# Patient Record
Sex: Male | Born: 1940 | Race: White | Hispanic: No | Marital: Married | Smoking: Former smoker
Health system: Southern US, Community
[De-identification: ages and names within clinical notes are randomized; demographics above are authoritative.]

## PROBLEM LIST (undated history)

## (undated) DIAGNOSIS — Z9889 Other specified postprocedural states: Secondary | ICD-10-CM

## (undated) DIAGNOSIS — N400 Enlarged prostate without lower urinary tract symptoms: Secondary | ICD-10-CM

## (undated) DIAGNOSIS — Z8042 Family history of malignant neoplasm of prostate: Secondary | ICD-10-CM

## (undated) DIAGNOSIS — Z803 Family history of malignant neoplasm of breast: Secondary | ICD-10-CM

## (undated) DIAGNOSIS — M1990 Unspecified osteoarthritis, unspecified site: Secondary | ICD-10-CM

## (undated) DIAGNOSIS — K269 Duodenal ulcer, unspecified as acute or chronic, without hemorrhage or perforation: Secondary | ICD-10-CM

## (undated) DIAGNOSIS — Z8719 Personal history of other diseases of the digestive system: Secondary | ICD-10-CM

## (undated) DIAGNOSIS — R112 Nausea with vomiting, unspecified: Secondary | ICD-10-CM

## (undated) DIAGNOSIS — Z87442 Personal history of urinary calculi: Secondary | ICD-10-CM

## (undated) DIAGNOSIS — Z87448 Personal history of other diseases of urinary system: Secondary | ICD-10-CM

## (undated) DIAGNOSIS — C4490 Unspecified malignant neoplasm of skin, unspecified: Secondary | ICD-10-CM

## (undated) DIAGNOSIS — Z8669 Personal history of other diseases of the nervous system and sense organs: Secondary | ICD-10-CM

## (undated) DIAGNOSIS — Z85820 Personal history of malignant melanoma of skin: Secondary | ICD-10-CM

## (undated) DIAGNOSIS — D649 Anemia, unspecified: Secondary | ICD-10-CM

## (undated) DIAGNOSIS — E611 Iron deficiency: Secondary | ICD-10-CM

## (undated) DIAGNOSIS — K4090 Unilateral inguinal hernia, without obstruction or gangrene, not specified as recurrent: Secondary | ICD-10-CM

## (undated) DIAGNOSIS — C61 Malignant neoplasm of prostate: Secondary | ICD-10-CM

## (undated) DIAGNOSIS — B0229 Other postherpetic nervous system involvement: Secondary | ICD-10-CM

## (undated) DIAGNOSIS — I714 Abdominal aortic aneurysm, without rupture, unspecified: Secondary | ICD-10-CM

## (undated) DIAGNOSIS — R3129 Other microscopic hematuria: Secondary | ICD-10-CM

## (undated) DIAGNOSIS — R296 Repeated falls: Secondary | ICD-10-CM

## (undated) HISTORY — PX: INGUINAL HERNIA REPAIR: SUR1180

## (undated) HISTORY — DX: Family history of malignant neoplasm of prostate: Z80.42

## (undated) HISTORY — PX: JOINT REPLACEMENT: SHX530

## (undated) HISTORY — PX: MELANOMA EXCISION: SHX5266

## (undated) HISTORY — DX: Iron deficiency: E61.1

## (undated) HISTORY — PX: TONSILLECTOMY AND ADENOIDECTOMY: SUR1326

## (undated) HISTORY — DX: Other postherpetic nervous system involvement: B02.29

## (undated) HISTORY — DX: Repeated falls: R29.6

## (undated) HISTORY — DX: Personal history of malignant melanoma of skin: Z85.820

## (undated) HISTORY — PX: CATARACT EXTRACTION W/ INTRAOCULAR LENS  IMPLANT, BILATERAL: SHX1307

## (undated) HISTORY — DX: Family history of malignant neoplasm of breast: Z80.3

## (undated) HISTORY — PX: HERNIA REPAIR: SHX51

## (undated) HISTORY — PX: COLON SURGERY: SHX602

---
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
## 1962-07-18 DIAGNOSIS — K269 Duodenal ulcer, unspecified as acute or chronic, without hemorrhage or perforation: Secondary | ICD-10-CM

## 1962-07-18 HISTORY — DX: Duodenal ulcer, unspecified as acute or chronic, without hemorrhage or perforation: K26.9

## 1999-06-30 ENCOUNTER — Ambulatory Visit (HOSPITAL_BASED_OUTPATIENT_CLINIC_OR_DEPARTMENT_OTHER): Admission: RE | Admit: 1999-06-30 | Discharge: 1999-06-30 | Payer: Self-pay | Admitting: Surgery

## 2001-07-18 DIAGNOSIS — Z8669 Personal history of other diseases of the nervous system and sense organs: Secondary | ICD-10-CM

## 2001-07-18 HISTORY — DX: Personal history of other diseases of the nervous system and sense organs: Z86.69

## 2001-09-16 ENCOUNTER — Inpatient Hospital Stay (HOSPITAL_COMMUNITY): Admission: EM | Admit: 2001-09-16 | Discharge: 2001-09-24 | Payer: Self-pay

## 2001-09-24 ENCOUNTER — Inpatient Hospital Stay (HOSPITAL_COMMUNITY)
Admission: RE | Admit: 2001-09-24 | Discharge: 2001-11-16 | Payer: Self-pay | Admitting: Physical Medicine & Rehabilitation

## 2001-11-23 ENCOUNTER — Encounter
Admission: RE | Admit: 2001-11-23 | Discharge: 2002-02-18 | Payer: Self-pay | Admitting: Physical Medicine & Rehabilitation

## 2002-08-09 ENCOUNTER — Encounter: Payer: Self-pay | Admitting: Emergency Medicine

## 2002-08-09 ENCOUNTER — Emergency Department (HOSPITAL_COMMUNITY): Admission: EM | Admit: 2002-08-09 | Discharge: 2002-08-09 | Payer: Self-pay | Admitting: Emergency Medicine

## 2003-02-19 ENCOUNTER — Ambulatory Visit (HOSPITAL_COMMUNITY): Admission: RE | Admit: 2003-02-19 | Discharge: 2003-02-19 | Payer: Self-pay | Admitting: Gastroenterology

## 2004-09-08 ENCOUNTER — Ambulatory Visit (HOSPITAL_COMMUNITY): Admission: RE | Admit: 2004-09-08 | Discharge: 2004-09-08 | Payer: Self-pay | Admitting: Surgery

## 2006-11-03 ENCOUNTER — Encounter: Admission: RE | Admit: 2006-11-03 | Discharge: 2006-11-03 | Payer: Self-pay | Admitting: Sports Medicine

## 2007-03-27 ENCOUNTER — Ambulatory Visit (HOSPITAL_COMMUNITY): Admission: RE | Admit: 2007-03-27 | Discharge: 2007-03-27 | Payer: Self-pay | Admitting: General Surgery

## 2007-04-04 ENCOUNTER — Encounter (INDEPENDENT_AMBULATORY_CARE_PROVIDER_SITE_OTHER): Payer: Self-pay | Admitting: General Surgery

## 2007-04-04 ENCOUNTER — Ambulatory Visit (HOSPITAL_BASED_OUTPATIENT_CLINIC_OR_DEPARTMENT_OTHER): Admission: RE | Admit: 2007-04-04 | Discharge: 2007-04-04 | Payer: Self-pay | Admitting: General Surgery

## 2007-04-20 ENCOUNTER — Ambulatory Visit: Payer: Self-pay | Admitting: Internal Medicine

## 2007-04-30 ENCOUNTER — Ambulatory Visit (HOSPITAL_COMMUNITY): Admission: RE | Admit: 2007-04-30 | Discharge: 2007-04-30 | Payer: Self-pay | Admitting: Internal Medicine

## 2007-04-30 LAB — CBC WITH DIFFERENTIAL/PLATELET
BASO%: 1 % (ref 0.0–2.0)
Basophils Absolute: 0 10*3/uL (ref 0.0–0.1)
EOS%: 2.1 % (ref 0.0–7.0)
Eosinophils Absolute: 0.1 10*3/uL (ref 0.0–0.5)
HCT: 47.9 % (ref 38.7–49.9)
HGB: 17 g/dL (ref 13.0–17.1)
LYMPH%: 43.2 % (ref 14.0–48.0)
MCH: 31 pg (ref 28.0–33.4)
MCHC: 35.5 g/dL (ref 32.0–35.9)
MCV: 87.3 fL (ref 81.6–98.0)
MONO#: 0.4 10*3/uL (ref 0.1–0.9)
MONO%: 9.9 % (ref 0.0–13.0)
NEUT#: 2 10*3/uL (ref 1.5–6.5)
NEUT%: 43.8 % (ref 40.0–75.0)
Platelets: 179 10*3/uL (ref 145–400)
RBC: 5.49 10*6/uL (ref 4.20–5.71)
RDW: 13.4 % (ref 11.2–14.6)
WBC: 4.5 10*3/uL (ref 4.0–10.0)
lymph#: 1.9 10*3/uL (ref 0.9–3.3)

## 2007-04-30 LAB — COMPREHENSIVE METABOLIC PANEL
ALT: 10 U/L (ref 0–53)
AST: 13 U/L (ref 0–37)
Albumin: 4.4 g/dL (ref 3.5–5.2)
Alkaline Phosphatase: 68 U/L (ref 39–117)
BUN: 18 mg/dL (ref 6–23)
CO2: 29 mEq/L (ref 19–32)
Calcium: 9.2 mg/dL (ref 8.4–10.5)
Chloride: 104 mEq/L (ref 96–112)
Creatinine, Ser: 0.96 mg/dL (ref 0.40–1.50)
Glucose, Bld: 97 mg/dL (ref 70–99)
Potassium: 4.7 mEq/L (ref 3.5–5.3)
Sodium: 142 mEq/L (ref 135–145)
Total Bilirubin: 0.9 mg/dL (ref 0.3–1.2)
Total Protein: 6.8 g/dL (ref 6.0–8.3)

## 2007-04-30 LAB — LACTATE DEHYDROGENASE: LDH: 147 U/L (ref 94–250)

## 2007-06-07 ENCOUNTER — Encounter: Admission: RE | Admit: 2007-06-07 | Discharge: 2007-07-18 | Payer: Self-pay | Admitting: Family Medicine

## 2007-07-20 ENCOUNTER — Encounter: Admission: RE | Admit: 2007-07-20 | Discharge: 2007-07-27 | Payer: Self-pay | Admitting: Family Medicine

## 2007-10-18 ENCOUNTER — Ambulatory Visit: Payer: Self-pay | Admitting: Internal Medicine

## 2007-10-22 LAB — CBC WITH DIFFERENTIAL/PLATELET
BASO%: 0.4 % (ref 0.0–2.0)
Basophils Absolute: 0 10*3/uL (ref 0.0–0.1)
EOS%: 1.3 % (ref 0.0–7.0)
Eosinophils Absolute: 0.1 10*3/uL (ref 0.0–0.5)
HCT: 46.4 % (ref 38.7–49.9)
HGB: 16.5 g/dL (ref 13.0–17.1)
LYMPH%: 29.8 % (ref 14.0–48.0)
MCH: 31.2 pg (ref 28.0–33.4)
MCHC: 35.5 g/dL (ref 32.0–35.9)
MCV: 87.8 fL (ref 81.6–98.0)
MONO#: 0.5 10*3/uL (ref 0.1–0.9)
MONO%: 7.8 % (ref 0.0–13.0)
NEUT#: 3.7 10*3/uL (ref 1.5–6.5)
NEUT%: 60.7 % (ref 40.0–75.0)
Platelets: 179 10*3/uL (ref 145–400)
RBC: 5.28 10*6/uL (ref 4.20–5.71)
RDW: 13.9 % (ref 11.2–14.6)
WBC: 6.1 10*3/uL (ref 4.0–10.0)
lymph#: 1.8 10*3/uL (ref 0.9–3.3)

## 2007-10-22 LAB — COMPREHENSIVE METABOLIC PANEL
ALT: 10 U/L (ref 0–53)
AST: 15 U/L (ref 0–37)
Albumin: 4.4 g/dL (ref 3.5–5.2)
Alkaline Phosphatase: 59 U/L (ref 39–117)
BUN: 19 mg/dL (ref 6–23)
CO2: 26 mEq/L (ref 19–32)
Calcium: 9.2 mg/dL (ref 8.4–10.5)
Chloride: 104 mEq/L (ref 96–112)
Creatinine, Ser: 0.84 mg/dL (ref 0.40–1.50)
Glucose, Bld: 99 mg/dL (ref 70–99)
Potassium: 4 mEq/L (ref 3.5–5.3)
Sodium: 140 mEq/L (ref 135–145)
Total Bilirubin: 0.7 mg/dL (ref 0.3–1.2)
Total Protein: 6.8 g/dL (ref 6.0–8.3)

## 2007-10-22 LAB — LACTATE DEHYDROGENASE: LDH: 150 U/L (ref 94–250)

## 2008-04-03 ENCOUNTER — Ambulatory Visit: Payer: Self-pay | Admitting: Internal Medicine

## 2008-04-07 LAB — COMPREHENSIVE METABOLIC PANEL
ALT: 11 U/L (ref 0–53)
AST: 14 U/L (ref 0–37)
Albumin: 4 g/dL (ref 3.5–5.2)
Alkaline Phosphatase: 70 U/L (ref 39–117)
BUN: 20 mg/dL (ref 6–23)
CO2: 29 mEq/L (ref 19–32)
Calcium: 8.8 mg/dL (ref 8.4–10.5)
Chloride: 103 mEq/L (ref 96–112)
Creatinine, Ser: 1.21 mg/dL (ref 0.40–1.50)
Glucose, Bld: 132 mg/dL — ABNORMAL HIGH (ref 70–99)
Potassium: 3.7 mEq/L (ref 3.5–5.3)
Sodium: 139 mEq/L (ref 135–145)
Total Bilirubin: 0.6 mg/dL (ref 0.3–1.2)
Total Protein: 6.5 g/dL (ref 6.0–8.3)

## 2008-04-07 LAB — CBC WITH DIFFERENTIAL/PLATELET
BASO%: 0.4 % (ref 0.0–2.0)
Basophils Absolute: 0 10*3/uL (ref 0.0–0.1)
EOS%: 0.7 % (ref 0.0–7.0)
Eosinophils Absolute: 0 10*3/uL (ref 0.0–0.5)
HCT: 46.2 % (ref 38.7–49.9)
HGB: 16.2 g/dL (ref 13.0–17.1)
LYMPH%: 23.7 % (ref 14.0–48.0)
MCH: 31.2 pg (ref 28.0–33.4)
MCHC: 35 g/dL (ref 32.0–35.9)
MCV: 89 fL (ref 81.6–98.0)
MONO#: 0.3 10*3/uL (ref 0.1–0.9)
MONO%: 5.6 % (ref 0.0–13.0)
NEUT#: 3.9 10*3/uL (ref 1.5–6.5)
NEUT%: 69.6 % (ref 40.0–75.0)
Platelets: 167 10*3/uL (ref 145–400)
RBC: 5.2 10*6/uL (ref 4.20–5.71)
RDW: 13.8 % (ref 11.2–14.6)
WBC: 5.6 10*3/uL (ref 4.0–10.0)
lymph#: 1.3 10*3/uL (ref 0.9–3.3)

## 2008-04-14 ENCOUNTER — Ambulatory Visit (HOSPITAL_COMMUNITY): Admission: RE | Admit: 2008-04-14 | Discharge: 2008-04-14 | Payer: Self-pay | Admitting: Internal Medicine

## 2008-09-30 ENCOUNTER — Ambulatory Visit: Payer: Self-pay | Admitting: Internal Medicine

## 2008-10-02 LAB — COMPREHENSIVE METABOLIC PANEL
ALT: 12 U/L (ref 0–53)
AST: 17 U/L (ref 0–37)
Albumin: 4.4 g/dL (ref 3.5–5.2)
Alkaline Phosphatase: 55 U/L (ref 39–117)
BUN: 23 mg/dL (ref 6–23)
CO2: 29 mEq/L (ref 19–32)
Calcium: 8.9 mg/dL (ref 8.4–10.5)
Chloride: 102 mEq/L (ref 96–112)
Creatinine, Ser: 1.2 mg/dL (ref 0.40–1.50)
Glucose, Bld: 109 mg/dL — ABNORMAL HIGH (ref 70–99)
Potassium: 4.4 mEq/L (ref 3.5–5.3)
Sodium: 139 mEq/L (ref 135–145)
Total Bilirubin: 0.8 mg/dL (ref 0.3–1.2)
Total Protein: 6.7 g/dL (ref 6.0–8.3)

## 2008-10-02 LAB — CBC WITH DIFFERENTIAL/PLATELET
BASO%: 0.9 % (ref 0.0–2.0)
Basophils Absolute: 0.1 10*3/uL (ref 0.0–0.1)
EOS%: 2.8 % (ref 0.0–7.0)
Eosinophils Absolute: 0.2 10*3/uL (ref 0.0–0.5)
HCT: 48 % (ref 38.4–49.9)
HGB: 16.4 g/dL (ref 13.0–17.1)
LYMPH%: 33.9 % (ref 14.0–49.0)
MCH: 30.8 pg (ref 27.2–33.4)
MCHC: 34.2 g/dL (ref 32.0–36.0)
MCV: 90.1 fL (ref 79.3–98.0)
MONO#: 0.5 10*3/uL (ref 0.1–0.9)
MONO%: 8.3 % (ref 0.0–14.0)
NEUT#: 3.1 10*3/uL (ref 1.5–6.5)
NEUT%: 54.1 % (ref 39.0–75.0)
Platelets: 164 10*3/uL (ref 140–400)
RBC: 5.33 10*6/uL (ref 4.20–5.82)
RDW: 13.6 % (ref 11.0–14.6)
WBC: 5.7 10*3/uL (ref 4.0–10.3)
lymph#: 1.9 10*3/uL (ref 0.9–3.3)

## 2008-10-02 LAB — LACTATE DEHYDROGENASE: LDH: 152 U/L (ref 94–250)

## 2009-01-06 ENCOUNTER — Inpatient Hospital Stay (HOSPITAL_COMMUNITY): Admission: RE | Admit: 2009-01-06 | Discharge: 2009-01-08 | Payer: Self-pay | Admitting: Orthopedic Surgery

## 2009-01-06 HISTORY — PX: TOTAL HIP ARTHROPLASTY: SHX124

## 2009-04-08 ENCOUNTER — Ambulatory Visit: Payer: Self-pay | Admitting: Internal Medicine

## 2009-04-13 ENCOUNTER — Ambulatory Visit (HOSPITAL_COMMUNITY): Admission: RE | Admit: 2009-04-13 | Discharge: 2009-04-13 | Payer: Self-pay | Admitting: Internal Medicine

## 2009-04-13 LAB — COMPREHENSIVE METABOLIC PANEL
ALT: 8 U/L (ref 0–53)
AST: 15 U/L (ref 0–37)
Albumin: 4.5 g/dL (ref 3.5–5.2)
Alkaline Phosphatase: 79 U/L (ref 39–117)
BUN: 17 mg/dL (ref 6–23)
CO2: 30 mEq/L (ref 19–32)
Calcium: 9.2 mg/dL (ref 8.4–10.5)
Chloride: 103 mEq/L (ref 96–112)
Creatinine, Ser: 1.01 mg/dL (ref 0.40–1.50)
Glucose, Bld: 102 mg/dL — ABNORMAL HIGH (ref 70–99)
Potassium: 4.1 mEq/L (ref 3.5–5.3)
Sodium: 142 mEq/L (ref 135–145)
Total Bilirubin: 0.7 mg/dL (ref 0.3–1.2)
Total Protein: 6.8 g/dL (ref 6.0–8.3)

## 2009-04-13 LAB — CBC WITH DIFFERENTIAL/PLATELET
BASO%: 0.4 % (ref 0.0–2.0)
Basophils Absolute: 0 10*3/uL (ref 0.0–0.1)
EOS%: 0.6 % (ref 0.0–7.0)
Eosinophils Absolute: 0 10*3/uL (ref 0.0–0.5)
HCT: 48.5 % (ref 38.4–49.9)
HGB: 16.5 g/dL (ref 13.0–17.1)
LYMPH%: 27 % (ref 14.0–49.0)
MCH: 30.5 pg (ref 27.2–33.4)
MCHC: 34 g/dL (ref 32.0–36.0)
MCV: 89.4 fL (ref 79.3–98.0)
MONO#: 0.5 10*3/uL (ref 0.1–0.9)
MONO%: 7.4 % (ref 0.0–14.0)
NEUT#: 4 10*3/uL (ref 1.5–6.5)
NEUT%: 64.6 % (ref 39.0–75.0)
Platelets: 177 10*3/uL (ref 140–400)
RBC: 5.42 10*6/uL (ref 4.20–5.82)
RDW: 13.6 % (ref 11.0–14.6)
WBC: 6.2 10*3/uL (ref 4.0–10.3)
lymph#: 1.7 10*3/uL (ref 0.9–3.3)

## 2009-04-13 LAB — LACTATE DEHYDROGENASE: LDH: 147 U/L (ref 94–250)

## 2010-01-21 ENCOUNTER — Ambulatory Visit (HOSPITAL_COMMUNITY): Admission: RE | Admit: 2010-01-21 | Discharge: 2010-01-21 | Payer: Self-pay | Admitting: Gastroenterology

## 2010-03-31 ENCOUNTER — Ambulatory Visit: Payer: Self-pay | Admitting: Internal Medicine

## 2010-04-05 ENCOUNTER — Ambulatory Visit (HOSPITAL_COMMUNITY): Admission: RE | Admit: 2010-04-05 | Discharge: 2010-04-05 | Payer: Self-pay | Admitting: Internal Medicine

## 2010-04-05 LAB — CBC WITH DIFFERENTIAL/PLATELET
BASO%: 0.3 % (ref 0.0–2.0)
Basophils Absolute: 0 10*3/uL (ref 0.0–0.1)
EOS%: 1.1 % (ref 0.0–7.0)
Eosinophils Absolute: 0.1 10*3/uL (ref 0.0–0.5)
HCT: 49.3 % (ref 38.4–49.9)
HGB: 16.8 g/dL (ref 13.0–17.1)
LYMPH%: 24.5 % (ref 14.0–49.0)
MCH: 30.8 pg (ref 27.2–33.4)
MCHC: 34 g/dL (ref 32.0–36.0)
MCV: 90.6 fL (ref 79.3–98.0)
MONO#: 0.5 10*3/uL (ref 0.1–0.9)
MONO%: 7.4 % (ref 0.0–14.0)
NEUT#: 4.5 10*3/uL (ref 1.5–6.5)
NEUT%: 66.7 % (ref 39.0–75.0)
Platelets: 198 10*3/uL (ref 140–400)
RBC: 5.44 10*6/uL (ref 4.20–5.82)
RDW: 14 % (ref 11.0–14.6)
WBC: 6.8 10*3/uL (ref 4.0–10.3)
lymph#: 1.7 10*3/uL (ref 0.9–3.3)

## 2010-04-05 LAB — COMPREHENSIVE METABOLIC PANEL
ALT: 12 U/L (ref 0–53)
AST: 16 U/L (ref 0–37)
Albumin: 4.3 g/dL (ref 3.5–5.2)
Alkaline Phosphatase: 78 U/L (ref 39–117)
BUN: 20 mg/dL (ref 6–23)
CO2: 31 mEq/L (ref 19–32)
Calcium: 9.7 mg/dL (ref 8.4–10.5)
Chloride: 103 mEq/L (ref 96–112)
Creatinine, Ser: 1.43 mg/dL (ref 0.40–1.50)
Glucose, Bld: 116 mg/dL — ABNORMAL HIGH (ref 70–99)
Potassium: 4.6 mEq/L (ref 3.5–5.3)
Sodium: 144 mEq/L (ref 135–145)
Total Bilirubin: 0.5 mg/dL (ref 0.3–1.2)
Total Protein: 6.7 g/dL (ref 6.0–8.3)

## 2010-04-05 LAB — LACTATE DEHYDROGENASE: LDH: 175 U/L (ref 94–250)

## 2010-10-25 LAB — BASIC METABOLIC PANEL
BUN: 10 mg/dL (ref 6–23)
BUN: 8 mg/dL (ref 6–23)
BUN: 17 mg/dL (ref 6–23)
CO2: 28 mEq/L (ref 19–32)
CO2: 29 mEq/L (ref 19–32)
CO2: 30 mEq/L (ref 19–32)
Calcium: 8.1 mg/dL — ABNORMAL LOW (ref 8.4–10.5)
Calcium: 8.3 mg/dL — ABNORMAL LOW (ref 8.4–10.5)
Calcium: 9.3 mg/dL (ref 8.4–10.5)
Chloride: 103 mEq/L (ref 96–112)
Chloride: 107 mEq/L (ref 96–112)
Chloride: 105 mEq/L (ref 96–112)
Creatinine, Ser: 0.9 mg/dL (ref 0.4–1.5)
Creatinine, Ser: 1.04 mg/dL (ref 0.4–1.5)
Creatinine, Ser: 0.99 mg/dL (ref 0.4–1.5)
GFR calc Af Amer: >60 mL/min (ref >60)
GFR calc Af Amer: >60 mL/min (ref >60)
GFR calc Af Amer: >60 mL/min (ref >60)
GFR calc non Af Amer: >60 mL/min (ref >60)
GFR calc non Af Amer: >60 mL/min (ref >60)
GFR calc non Af Amer: >60 mL/min (ref >60)
Glucose, Bld: 128 mg/dL — ABNORMAL HIGH (ref 70–99)
Glucose, Bld: 138 mg/dL — ABNORMAL HIGH (ref 70–99)
Glucose, Bld: 115 mg/dL — ABNORMAL HIGH (ref 70–99)
Potassium: 3.8 mEq/L (ref 3.5–5.1)
Potassium: 3.9 mEq/L (ref 3.5–5.1)
Potassium: 3.6 mEq/L (ref 3.5–5.1)
Sodium: 138 mEq/L (ref 135–145)
Sodium: 139 mEq/L (ref 135–145)
Sodium: 143 mEq/L (ref 135–145)

## 2010-10-25 LAB — CBC
HCT: 37.5 % — ABNORMAL LOW (ref 39.0–52.0)
HCT: 39.1 % (ref 39.0–52.0)
HCT: 48.8 % (ref 39.0–52.0)
Hemoglobin: 12.9 g/dL — ABNORMAL LOW (ref 13.0–17.0)
Hemoglobin: 13.4 g/dL (ref 13.0–17.0)
Hemoglobin: 16.5 g/dL (ref 13.0–17.0)
MCHC: 34.1 g/dL (ref 30.0–36.0)
MCHC: 34.3 g/dL (ref 30.0–36.0)
MCHC: 33.8 g/dL (ref 30.0–36.0)
MCV: 91.9 fL (ref 78.0–100.0)
MCV: 92.1 fL (ref 78.0–100.0)
MCV: 92.3 fL (ref 78.0–100.0)
Platelets: 127 10*3/uL — ABNORMAL LOW (ref 150–400)
Platelets: 130 10*3/uL — ABNORMAL LOW (ref 150–400)
Platelets: 150 10*3/uL (ref 150–400)
RBC: 4.08 MIL/uL — ABNORMAL LOW (ref 4.22–5.81)
RBC: 4.25 MIL/uL (ref 4.22–5.81)
RBC: 5.29 MIL/uL (ref 4.22–5.81)
RDW: 13.7 % (ref 11.5–15.5)
RDW: 14 % (ref 11.5–15.5)
RDW: 13.6 % (ref 11.5–15.5)
WBC: 6.2 10*3/uL (ref 4.0–10.5)
WBC: 8.3 10*3/uL (ref 4.0–10.5)
WBC: 6 10*3/uL (ref 4.0–10.5)

## 2010-10-25 LAB — DIFFERENTIAL
Basophils Absolute: 0 10*3/uL (ref 0.0–0.1)
Basophils Relative: 1 % (ref 0–1)
Eosinophils Absolute: 0.1 10*3/uL (ref 0.0–0.7)
Eosinophils Relative: 1 % (ref 0–5)
Lymphocytes Relative: 34 % (ref 12–46)
Lymphs Abs: 2 10*3/uL (ref 0.7–4.0)
Monocytes Absolute: 0.4 10*3/uL (ref 0.1–1.0)
Monocytes Relative: 6 % (ref 3–12)
Neutro Abs: 3.5 10*3/uL (ref 1.7–7.7)
Neutrophils Relative %: 59 % (ref 43–77)

## 2010-10-25 LAB — APTT: aPTT: 32 seconds (ref 24–37)

## 2010-10-25 LAB — TYPE AND SCREEN
ABO/RH(D): A POS
Antibody Screen: NEGATIVE

## 2010-10-25 LAB — URINALYSIS, ROUTINE W REFLEX MICROSCOPIC
Bilirubin Urine: NEGATIVE
Glucose, UA: NEGATIVE mg/dL
Hgb urine dipstick: NEGATIVE
Ketones, ur: NEGATIVE mg/dL
Nitrite: NEGATIVE
Protein, ur: NEGATIVE mg/dL
Specific Gravity, Urine: 1.016 (ref 1.005–1.030)
Urobilinogen, UA: 0.2 mg/dL (ref 0.0–1.0)
pH: 5.5 (ref 5.0–8.0)

## 2010-10-25 LAB — PROTIME-INR
INR: 1 (ref 0.00–1.49)
Prothrombin Time: 13.5 seconds (ref 11.6–15.2)

## 2010-10-25 LAB — ABO/RH: ABO/RH(D): A POS

## 2010-11-30 NOTE — Op Note (Signed)
NAMEGREGERY, Aaron NO.:  [id number]   MEDICAL RECORD NO.:  [id number]          PATIENT TYPE:  INP   LOCATION:  0005                         FACILITY:  [facility]   PHYSICIAN:  Madlyn Frankel. Charlann Boxer, M.D.  DATE OF BIRTH:  17-Feb-1941   DATE OF PROCEDURE:  01/06/2009  DATE OF DISCHARGE:                               OPERATIVE REPORT   PREOPERATIVE DIAGNOSIS:  Right hip osteoarthritis.   POSTOPERATIVE DIAGNOSIS:  Right hip osteoarthritis.   PROCEDURE:  Right total hip replacement.   COMPONENTS USED:  DePuy hip system size 54 Pinnacle cup, 6 high Trilock  stem with a 36 +1.5 ball for metal-on-metal liner.   SURGEON:  Madlyn Frankel. Charlann Boxer, M.D.   ASSISTANT:  Dwyane Luo.   ANESTHESIA:  General.   BLOOD LOSS:  Approximately 300 mL.   DRAINS:  1 Hemovac.   COMPLICATIONS:  None.   SPECIMENS:  None.   FINDINGS:  None.   INDICATIONS FOR PROCEDURE:  Aaron Hawkins is a 70 year old male who had  presented to the office for evaluation of some right hip pain.  It  significantly reducing his quality of life and his ability to enjoy his  walks with associated night pain.  He was losing range of motion and  function.  Radiographs revealed end-stage bone-on-bone arthritis.  He  had failed conservative measures and wished to discuss operative  intervention.  The risks and benefits of hip replacement surgery were  discussed including infection, DVT, component failure, dislocation, need  for revision surgery.  We also discussed bearing surface options.  Consent was obtained for benefit of pain relief.   PROCEDURE IN DETAIL:  The patient was brought to the operative theater.  Once adequate anesthesia, preoperative antibiotics, Ancef administered,  the patient was positioned into the left lateral decubitus position with  the right side up and the right lower extremity was pre scrubbed,  prepped and draped in a sterile fashion.   Time-out was performed, identifying the patient, planned  procedure and  extremity.   Following this, a lateral-based incision was made for a posterior  approach to the hip.  Sharp dissection was carried down to the  iliotibial band and the gluteal fascia, which was incised posteriorly.   The short external rotators were identified and taken down separately  from the posterior capsule, preserving this posterior leaflet for later  anatomic repair but also to protect the sciatic nerve.   The hip was dislocated.  Severe degenerative changes noted in the hip.  A neck osteotomy was made based off anatomic landmarks and utilizing a  templated broach and head.   Attention was first directed to the femur.  I used a box osteotome to  assure laterality and remove some of the bone and lateral femoral cortex  and lateral neck.  A starting drill opened up the femur, hand reamer  once, and then irrigation to prevent fat emboli.  I then began broaching  with a size 1 broach, setting anteversion at approximately 25 degrees  and then broached up to a size 6, which sat at the level of the neck  cut.  I removed a little bit of the anterior bone but nothing  significant.   I packed off the femur and attended to the acetabulum.  Acetabular  retractors were placed.  The labrum was debrided.  Following the labral  debridement, I began reaming with a 47 reamer.  I then reamed up to a 53  reamer, with excellent bony bed preparation.  I did remove some anterior  acetabular osteophytes.  The final 54 Pinnacle cup was then impacted,  sitting in about 15-20 degrees of anteversion with a portion the cup  exposed in the superior lateral aspect and the cup seated beneath the  anterior wall anteriorly.   Based on the cup position and abduction of about 35-40 degrees, a single  cancellous screw was placed and the final 36 metal liner impacted into  position, with good visual fitting.   A trial reduction was carried out with a 6 broach and a high offset neck  with a 36  +1.5 ball to about a millimeter of shuck and extension and no  evidence of impingement.  The patient's range of motion was excellent  with hip flexion internal rotation to 80 degrees as well as in the sleep  position.   Following this trial reduction, the trial was removed and the final 6  high Trilock stem was opened and impacted to the level where the broach  sat.   The 36 +1.5 ball was retrialed.  I was satisfied with this.  I did not  feel the need to go up to a +5 ball.  Given these parameters, a final 36  +1.5 ball was impacted on a clean and dry trunnion and the hip reduced.   The hip was irrigated throughout the case.  There was some oozing but it  was not identifiable.  There was no bleeding within the gluteus  musculature, no artery bleeding in the short external rotators, that  could have been from anterior capsular-type bleeding.   I did at this point reapproximate the posterior capsule and superior  leaflet.  I ended up placing a medium Hemovac drain deep.  The  iliotibial band and gluteal fascia were reapproximated using #1 Vicryl.  The remainder of the wound was closed with 2-0 Vicryl and running 4-0  Monocryl.  The hip was cleaned, dried and dressed sterilely with Steri-  Strips and a Mepilex dressing.  He was brought to the recovery room in  stable condition, tolerating the procedure well.      Madlyn Frankel Charlann Boxer, M.D.  Electronically Signed    MDO/MEDQ  D:  01/06/2009  T:  01/07/2009  Job:  [id number]

## 2010-11-30 NOTE — H&P (Signed)
Aaron Hawkins, Aaron Hawkins NO.:  [id number]   MEDICAL RECORD NO.:  [id number]           PATIENT TYPE:  INP   LOCATION:  1618                         FACILITY:  [facility]   PHYSICIAN:  Madlyn Frankel. Charlann Boxer, M.D.  DATE OF BIRTH:  11/27/1940   DATE OF ADMISSION:  01/06/2009  DATE OF DISCHARGE:                              HISTORY & PHYSICAL   PRINCIPAL DIAGNOSIS:  Right hip osteoarthritis.   SECONDARY DIAGNOSIS:  1. History of melanoma and arthritis.  2. History of benign prostatic hypertrophy with a history of biopsy.  3. History of kidney stones.   ADMITTING HISTORY:  [name] is a very pleasant 70 year old gentleman  known to me as a family friend.  He presented for second opinion  evaluation of his right hip.  Radiographs revealed advanced right hip  osteoarthritis.   We had discussed risks and benefits of nonoperative versus operative  intervention.  At this point, he had failed conservative measures and  wished to proceed with arthroplasty due to fact that his quality of life  had significantly diminished.   PAST MEDICAL HISTORY:  1. History of melanoma.  2. History of arthritis.   PAST SURGICAL HISTORY:  1. Melanoma surgery.  2. Prostate biopsy in 2008.  3. Kidney stone surgery.  4. Hernia repair.   FAMILY MEDICAL HISTORY:  Coronary disease and dementia.   SOCIAL HISTORY:  He is retired.  Denies smoking.  Did smoke in the past,  and he has social alcohol only.  He has three kids that I know well.   CURRENT MEDICATIONS:  Aleve, Ocuvite, fish oil, baby aspirin.   DRUG ALLERGIES:  No known drug allergies.   REVIEW OF SYSTEMS:  Otherwise unremarkable for any recent upper  respiratory, pulmonary, cardiac, gastrointestinal, genitourinary issues  over the past couple weeks.   PHYSICAL EXAMINATION:  VITAL SIGNS:  In the office revealed he had a  pulses 60, respirations 16, blood pressure 135/90.  GENERAL:  Awake, alert and oriented and very pleasant.  HEENT:   Revealed no evidence of any ocular dysfunction or slurred  speech.  CHEST:  Clear to auscultation bilaterally.  HEART:  Regular rate and rhythm.  ABDOMEN:  Soft and nontender.  EXTREMITIES:  Examination revealed limited and painful right hip range  of motion.  On the left side he had a bit more motion with less pain.  He is otherwise neurovascularly stable without evidence of any venous or  vascular compromise.   LABORATORY DATA:  Will be evaluated at the [facility] system.  On a  preoperative evaluation, he had an EKG with his primary care physician,  Dr. Abigail Miyamoto.  He had been cleared preoperatively.  Radiographs in the  office of his hip were reviewed, indicating advanced right hip  osteoarthritis with left side with moderate to advanced.   ASSESSMENT:  Advanced right hip osteoarthritis.   PLAN:  At this point, [name] is going to be admitted for same-day  surgery on January 06, 2009, for a right total hip replacement.  Risks and  benefits have been reviewed extensively.  Questions were encouraged,  answered and reviewed.  He will plan to return home.  We will plan to  use Lovenox for 2 weeks on him and then aspirin based on his history of  melanoma and the question of prostate issues just to be certain we do  not have a problem.  I think I had reviewed with him that we may use  aspirin, but I will probably end up giving him the Lovenox prescription  for 2 weeks.  Pain medicine will be given at the time of the discharge  from the hospital.      Madlyn Frankel. Charlann Boxer, M.D.  Electronically Signed     MDO/MEDQ  D:  12/26/2008  T:  12/26/2008  Job:  [id number]

## 2010-11-30 NOTE — Op Note (Signed)
Aaron Hawkins, Aaron Hawkins               ACCOUNT NO.:  [id number]   MEDICAL RECORD NO.:  [id number]          PATIENT TYPE:  AMB   LOCATION:  DSC                          FACILITY:  [facility]   PHYSICIAN:  Gabrielle Dare. Janee Morn, M.D.DATE OF BIRTH:  10/08/1940   DATE OF PROCEDURE:  04/04/2007  DATE OF DISCHARGE:                               OPERATIVE REPORT   PREOPERATIVE DIAGNOSIS:  Melanoma of the back.   POSTOPERATIVE DIAGNOSIS:  Melanoma of the back.   PROCEDURE:  1. Right axillary sentinel lymph node biopsy with blue dye injection.  2. Wide excision melanoma of the back.   SURGEON:  Gabrielle Dare. Janee Morn, M.D.   ANESTHESIA:  General.   HISTORY OF PRESENT ILLNESS:  Aaron Hawkins is a 70 year old gentleman who I  evaluated in the office for a lesion on his back. Dr. Yetta Barre had  performed a biopsy which demonstrated a superficial spreading melanoma  with a small invasive component. Total depth was 0.9 mm. However, there  was focal positive margins. He underwent lymphoscintigram demonstrating  sentinel node in the right axilla.  He presents today for right axillary  sentinel lymph node biopsy and wide excision of the melanoma on his  back.   PROCEDURE IN DETAIL:  Informed consent was obtained. The patient was  identified in the preop holding area.  He received intravenous  antibiotics.  His site was marked.  He had received the nucleotide  injection by nuclear medicine.  He was brought to the operating room.  General anesthesia was administered. Methylene blue was injected  cutaneously around the lesion of his back, 0.4 mL in a 1:4 ratio with  saline.  The area was then massaged for four minutes by the clock. His  right axilla was prepped and draped in a sterile fashion.  The NeoProbe  was used to find a hot section. A small transverse incision was made.  Subcutaneous tissues were dissected down. The axillary fat was entered.  Care was taken not to involve the thoracodorsal or long thoracic  nerves.  The NeoProbe was used as a guide and we identified a hot blue node.  This was circumferentially dissected and gave counts of approximately  700 on the NeoProbe. Hemostasis was assured and the adjoining lymph  ducts were cauterized.  The node was passed off and sent to pathology.  Further exploration with the NeoProbe revealed no other significant  signal. The wound was copiously irrigated.  Meticulous hemostasis was  assured.  Subcutaneous tissues were approximated with a running 3-0  Vicryl suture and the skin was closed with running 4-0 Monocryl  subcuticular stitch.  Sponge, needle and instrument counts were correct.  0.5% Marcaine with epinephrine was injected for postoperative pain  relief.  Benzoin and Steri-Strips were applied.  A sterile dressing was  placed.   At this time, the patient was then repositioned by anesthesia to prone.  His back was prepped and draped in a sterile fashion.  An area creating  at least an approximately 1.5 cm margin circumferentially from the  lesion was measured out and an elliptical incision was then planned to  facilitate the margins as well as closure. This was planned vertically  along tissue planes.  The area was infiltrated with 0.5% Marcaine with  epinephrine.  The wide elliptical incision was made.  Subcutaneous  tissues were dissected down to the underlying fascia and the specimen  was removed in its entirety.  It was 5 cm x 9 cm in size.  It was  oriented with suture for pathology and sent off. The wound was copiously  irrigated.  Hemostasis was assured. Flaps were raised in the  subcutaneous planes medially and laterally to facilitate closure.  The  wound was then closed, first with deep tissues approximated with  interrupted 2-0 Vicryl suture, more superficial subcutaneous tissues  were again closed with interrupted 2-0 Vicryl sutures.  The area was  again irrigated.  Hemostasis was assured and the skin was closed with   interrupted 3-0 nylon sutures.  The wound closed nicely without  significant tension.  Sponge, needle and instrument counts were correct.  Xeroform and a sterile bulky dressing were applied.  The patient  tolerated the procedure well without apparent complication and was taken  to the recovery room in stable condition.      Gabrielle Dare Janee Morn, M.D.  Electronically Signed     BET/MEDQ  D:  04/04/2007  T:  04/04/2007  Job:  [id number]   cc:   Rocco Serene, M.D.

## 2010-11-30 NOTE — Discharge Summary (Signed)
Aaron Hawkins, Aaron Hawkins NO.:  [id number]   MEDICAL RECORD NO.:  [id number]          PATIENT TYPE:  INP   LOCATION:  1618                         FACILITY:  [facility]   PHYSICIAN:  Madlyn Frankel. Charlann Boxer, M.D.  DATE OF BIRTH:  [birth year]   DATE OF ADMISSION:  01/06/2009  DATE OF DISCHARGE:  01/08/2009                               DISCHARGE SUMMARY   ADMISSION DIAGNOSES:  1. Osteoarthritis.  2. Melanoma.  3. Benign prostatic hypertrophy.  4. History of kidney stones.   DISCHARGE DIAGNOSES:  1. Osteoarthritis.  2. Melanoma.  3. Benign prostatic hypertrophy.  4. Kidney stones.   HISTORY OF PRESENT ILLNESS:  A 70 year old gentleman with history of  right hip pain secondary to osteoarthritis.   CONSULTATIONS:  None.   LABORATORY DATA:  CBC:  Final reading white blood cell 8.3, hemoglobin  12.9, hematocrit 37.5.  Metabolic:  Sodium 139, potassium 3.9, BUN 8,  creatinine 0.9, glucose 138.   PROCEDURE PERFORMED:  Right total hip replacement by Dr. Durene Romans.  Assistant, Dwyane Luo, PA-C.   HOSPITAL COURSE:  The patient underwent hip replacement surgery and  admitted to the orthopedic floor.  His stay was unremarkable other than  some mild nausea which resolved with change in medication.  Otherwise,  he remained hemodynamically and orthopedically stable.  Dressing was  changed on day #2.  No significant drainage from the wound.  There was  some drainage through the Hemovac portal.  This was dry dressed.  He was  weight bearing as tolerated and with physical therapy he made improved  progress during his course of stay.  By day #2 he had met all criteria  for discharge home.   DISPOSITION:  Discharged home in stable, improved condition.   DIET:  Heart healthy.   DISCHARGE WOUND CARE:  Keep dry.   DISCHARGE PHYSICAL THERAPY:  Weight bearing as tolerated with the use of  a rolling walker.   DISCHARGE MEDICATIONS:  1. Aspirin 325 mg one p.o. b.i.d. x6 weeks.  2.  Robaxin 500 mg one p.o. q.6.  3. Iron 325 mg one p.o. t.i.d. x2 weeks.  4. Colace 100 mg p.o. b.i.d.  5. MiraLax 17 g p.o. daily.  6. Darvocet N-100 one to two p.o. q.4 to 6 p.r.n. pain.  7. Phenergan 12.5 mg one p.o. q.8 p.r.n. nausea and vomiting.  8. Multivitamin p.o. daily.  9. Glucosamine p.o. daily.   DISCHARGE FOLLOWUP:  Followup with Dr. Charlann Boxer at phone number [phone] in  2 weeks for wound check      Madlyn Frankel. Charlann Boxer, M.D.  Electronically Signed     Madlyn Frankel. Charlann Boxer, M.D.  Electronically Signed    MDO/MEDQ  D:  01/08/2009  T:  01/08/2009  Job:  [id number]   cc:   Chales Salmon. Abigail Miyamoto, M.D.  Fax: [phone]   Rocco Serene, M.D.  Fax: [phone]   Courtney Paris, M.D.  Fax: [phone]

## 2011-03-30 ENCOUNTER — Encounter (HOSPITAL_BASED_OUTPATIENT_CLINIC_OR_DEPARTMENT_OTHER): Payer: Medicare Other | Admitting: Internal Medicine

## 2011-03-30 ENCOUNTER — Other Ambulatory Visit: Payer: Self-pay | Admitting: Internal Medicine

## 2011-03-30 ENCOUNTER — Ambulatory Visit (HOSPITAL_COMMUNITY)
Admission: RE | Admit: 2011-03-30 | Discharge: 2011-03-30 | Disposition: A | Discharge status: Home / Self Care | Payer: Medicare Other | Source: Ambulatory Visit | Attending: Internal Medicine | Admitting: Internal Medicine

## 2011-03-30 DIAGNOSIS — C4359 Malignant melanoma of other part of trunk: Secondary | ICD-10-CM

## 2011-03-30 DIAGNOSIS — Z85820 Personal history of malignant melanoma of skin: Secondary | ICD-10-CM | POA: Insufficient documentation

## 2011-03-30 DIAGNOSIS — C439 Malignant melanoma of skin, unspecified: Secondary | ICD-10-CM

## 2011-03-30 DIAGNOSIS — I517 Cardiomegaly: Secondary | ICD-10-CM | POA: Insufficient documentation

## 2011-03-30 LAB — CBC WITH DIFFERENTIAL/PLATELET
BASO%: 0.6 % (ref 0.0–2.0)
Basophils Absolute: 0 10*3/uL (ref 0.0–0.1)
EOS%: 1.7 % (ref 0.0–7.0)
Eosinophils Absolute: 0.1 10*3/uL (ref 0.0–0.5)
HCT: 48.6 % (ref 38.4–49.9)
HGB: 16.9 g/dL (ref 13.0–17.1)
LYMPH%: 37.2 % (ref 14.0–49.0)
MCH: 31.6 pg (ref 27.2–33.4)
MCHC: 34.6 g/dL (ref 32.0–36.0)
MCV: 91.2 fL (ref 79.3–98.0)
MONO#: 0.5 10*3/uL (ref 0.1–0.9)
MONO%: 9.7 % (ref 0.0–14.0)
NEUT#: 2.6 10*3/uL (ref 1.5–6.5)
NEUT%: 50.8 % (ref 39.0–75.0)
Platelets: 167 10*3/uL (ref 140–400)
RBC: 5.33 10*6/uL (ref 4.20–5.82)
RDW: 13.8 % (ref 11.0–14.6)
WBC: 5.1 10*3/uL (ref 4.0–10.3)
lymph#: 1.9 10*3/uL (ref 0.9–3.3)

## 2011-03-30 LAB — COMPREHENSIVE METABOLIC PANEL
ALT: 13 U/L (ref 0–53)
AST: 17 U/L (ref 0–37)
Albumin: 4.3 g/dL (ref 3.5–5.2)
Alkaline Phosphatase: 64 U/L (ref 39–117)
BUN: 17 mg/dL (ref 6–23)
CO2: 30 mEq/L (ref 19–32)
Calcium: 8.9 mg/dL (ref 8.4–10.5)
Chloride: 102 mEq/L (ref 96–112)
Creatinine, Ser: 1.15 mg/dL (ref 0.50–1.35)
Glucose, Bld: 108 mg/dL — ABNORMAL HIGH (ref 70–99)
Potassium: 4.3 mEq/L (ref 3.5–5.3)
Sodium: 143 mEq/L (ref 135–145)
Total Bilirubin: 0.6 mg/dL (ref 0.3–1.2)
Total Protein: 6.5 g/dL (ref 6.0–8.3)

## 2011-03-30 LAB — LACTATE DEHYDROGENASE: LDH: 150 U/L (ref 94–250)

## 2011-04-06 ENCOUNTER — Encounter (HOSPITAL_BASED_OUTPATIENT_CLINIC_OR_DEPARTMENT_OTHER): Payer: Medicare Other | Admitting: Internal Medicine

## 2011-04-06 DIAGNOSIS — Z85820 Personal history of malignant melanoma of skin: Secondary | ICD-10-CM

## 2011-04-28 LAB — POCT HEMOGLOBIN-HEMACUE
Hemoglobin: 17.5 — ABNORMAL HIGH
Operator id: 116011

## 2011-06-20 ENCOUNTER — Other Ambulatory Visit: Payer: Self-pay | Admitting: Dermatology

## 2011-09-01 DIAGNOSIS — N529 Male erectile dysfunction, unspecified: Secondary | ICD-10-CM | POA: Diagnosis not present

## 2011-09-01 DIAGNOSIS — R972 Elevated prostate specific antigen [PSA]: Secondary | ICD-10-CM | POA: Diagnosis not present

## 2011-09-15 DIAGNOSIS — R030 Elevated blood-pressure reading, without diagnosis of hypertension: Secondary | ICD-10-CM | POA: Diagnosis not present

## 2011-09-15 DIAGNOSIS — Z131 Encounter for screening for diabetes mellitus: Secondary | ICD-10-CM | POA: Diagnosis not present

## 2011-09-15 DIAGNOSIS — Z136 Encounter for screening for cardiovascular disorders: Secondary | ICD-10-CM | POA: Diagnosis not present

## 2012-01-10 DIAGNOSIS — D239 Other benign neoplasm of skin, unspecified: Secondary | ICD-10-CM | POA: Diagnosis not present

## 2012-01-10 DIAGNOSIS — Z85820 Personal history of malignant melanoma of skin: Secondary | ICD-10-CM | POA: Diagnosis not present

## 2012-02-20 DIAGNOSIS — L03119 Cellulitis of unspecified part of limb: Secondary | ICD-10-CM | POA: Diagnosis not present

## 2012-02-20 DIAGNOSIS — L02619 Cutaneous abscess of unspecified foot: Secondary | ICD-10-CM | POA: Diagnosis not present

## 2012-03-21 DIAGNOSIS — M79609 Pain in unspecified limb: Secondary | ICD-10-CM | POA: Diagnosis not present

## 2012-03-21 DIAGNOSIS — M722 Plantar fascial fibromatosis: Secondary | ICD-10-CM | POA: Diagnosis not present

## 2012-03-21 DIAGNOSIS — M7150 Other bursitis, not elsewhere classified, unspecified site: Secondary | ICD-10-CM | POA: Diagnosis not present

## 2012-03-21 DIAGNOSIS — M7730 Calcaneal spur, unspecified foot: Secondary | ICD-10-CM | POA: Diagnosis not present

## 2012-03-29 DIAGNOSIS — M7150 Other bursitis, not elsewhere classified, unspecified site: Secondary | ICD-10-CM | POA: Diagnosis not present

## 2012-03-29 DIAGNOSIS — M722 Plantar fascial fibromatosis: Secondary | ICD-10-CM | POA: Diagnosis not present

## 2012-03-29 DIAGNOSIS — M659 Synovitis and tenosynovitis, unspecified: Secondary | ICD-10-CM | POA: Diagnosis not present

## 2012-03-29 DIAGNOSIS — M65979 Unspecified synovitis and tenosynovitis, unspecified ankle and foot: Secondary | ICD-10-CM | POA: Diagnosis not present

## 2012-04-12 DIAGNOSIS — M722 Plantar fascial fibromatosis: Secondary | ICD-10-CM | POA: Diagnosis not present

## 2012-04-12 DIAGNOSIS — M7150 Other bursitis, not elsewhere classified, unspecified site: Secondary | ICD-10-CM | POA: Diagnosis not present

## 2012-07-19 DIAGNOSIS — D239 Other benign neoplasm of skin, unspecified: Secondary | ICD-10-CM | POA: Diagnosis not present

## 2012-07-19 DIAGNOSIS — D1801 Hemangioma of skin and subcutaneous tissue: Secondary | ICD-10-CM | POA: Diagnosis not present

## 2012-07-19 DIAGNOSIS — Z85820 Personal history of malignant melanoma of skin: Secondary | ICD-10-CM | POA: Diagnosis not present

## 2012-07-19 DIAGNOSIS — L819 Disorder of pigmentation, unspecified: Secondary | ICD-10-CM | POA: Diagnosis not present

## 2012-07-19 DIAGNOSIS — L821 Other seborrheic keratosis: Secondary | ICD-10-CM | POA: Diagnosis not present

## 2012-08-24 ENCOUNTER — Emergency Department (HOSPITAL_COMMUNITY)
Admission: EM | Admit: 2012-08-24 | Discharge: 2012-08-24 | Disposition: A | Discharge status: Home / Self Care | Payer: Medicare Other | Attending: Emergency Medicine | Admitting: Emergency Medicine

## 2012-08-24 ENCOUNTER — Encounter (HOSPITAL_COMMUNITY): Payer: Self-pay

## 2012-08-24 DIAGNOSIS — H8110 Benign paroxysmal vertigo, unspecified ear: Secondary | ICD-10-CM | POA: Insufficient documentation

## 2012-08-24 DIAGNOSIS — I119 Hypertensive heart disease without heart failure: Secondary | ICD-10-CM | POA: Diagnosis not present

## 2012-08-24 DIAGNOSIS — Z87448 Personal history of other diseases of urinary system: Secondary | ICD-10-CM | POA: Insufficient documentation

## 2012-08-24 DIAGNOSIS — Z85820 Personal history of malignant melanoma of skin: Secondary | ICD-10-CM | POA: Diagnosis not present

## 2012-08-24 DIAGNOSIS — H6120 Impacted cerumen, unspecified ear: Secondary | ICD-10-CM | POA: Insufficient documentation

## 2012-08-24 DIAGNOSIS — R42 Dizziness and giddiness: Secondary | ICD-10-CM | POA: Diagnosis not present

## 2012-08-24 DIAGNOSIS — R404 Transient alteration of awareness: Secondary | ICD-10-CM | POA: Diagnosis not present

## 2012-08-24 HISTORY — DX: Benign prostatic hyperplasia without lower urinary tract symptoms: N40.0

## 2012-08-24 MED ORDER — MECLIZINE HCL 50 MG PO TABS: 25.0000 mg | ORAL_TABLET | Freq: Three times a day (TID) | ORAL | Status: DC | PRN

## 2012-08-24 NOTE — ED Provider Notes (Signed)
History     CSN: [id number]  Arrival date & time 08/24/12  0441   First MD Initiated Contact with Patient 08/24/12 0505      Chief Complaint  Patient presents with  . Dizziness    (Consider location/radiation/quality/duration/timing/severity/associated sxs/prior treatment) HPI 72 year old male presents to the emergency department with complaint of intermittent vertigo for the last 3 days. Patient reports he had first episode 3 days ago when he turned his head to the left while laying in bed. Symptoms resolved after just a few minutes of staying still. He had another episode yesterday when he turned his head to left. Tonight, he got up to use the bathroom and when he returned to bed he had persi symptoms. They have since resolved. He denies any nausea or vomiting with the vertigo. No other associated neurologic symptoms. No recent upper respiratory illness. No prior history of vertigo. Patient was making arrangements to have work coverage, and deciding whether he needed to go to the hospital when he got to check his blood pressure. He was significantly elevated at home, but he reports that he was anxious and will but agitated, and since that time and is persistently come down. He denies any headache, no change in vision, no tinnitus, no difficulties with gait or balance.  Past Medical History  Diagnosis Date  . Enlarged prostate without urinary obstruction   . Cancer     melanoma    Past Surgical History  Procedure Date  . Hip surgery     No family history on file.  History  Substance Use Topics  . Smoking status: Never Smoker   . Smokeless tobacco: Not on file  . Alcohol Use: 0.6 oz/week    1 Glasses of wine per week      Review of Systems  See History of Present Illness; otherwise all other systems are reviewed and negative  Allergies  Review of patient's allergies indicates no known allergies.  Home Medications   Current Outpatient Rx  Name  Route  Sig  Dispense   Refill  . MECLIZINE HCL 50 MG PO TABS   Oral   Take 0.5 tablets (25 mg total) by mouth 3 (three) times daily as needed for dizziness or nausea.   30 tablet   0     BP 135/80  Pulse 67  Temp 99.3 F (37.4 C) (Oral)  Resp 12  SpO2 90%  Physical Exam  Nursing note and vitals reviewed. Constitutional: He is oriented to person, place, and time. He appears well-developed and well-nourished.  HENT:  Head: Normocephalic and atraumatic.  Right Ear: External ear normal.  Nose: Nose normal.  Mouth/Throat: Oropharynx is clear and moist.       Left TM blocked by cerumen  Eyes: Conjunctivae normal and EOM are normal. Pupils are equal, round, and reactive to light.  Neck: Normal range of motion. Neck supple. No JVD present. No tracheal deviation present. No thyromegaly present.  Cardiovascular: Normal rate, regular rhythm, normal heart sounds and intact distal pulses.  Exam reveals no gallop and no friction rub.   No murmur heard. Pulmonary/Chest: Effort normal and breath sounds normal. No stridor. No respiratory distress. He has no wheezes. He has no rales. He exhibits no tenderness.  Abdominal: Soft. Bowel sounds are normal. He exhibits no distension and no mass. There is no tenderness. There is no rebound and no guarding.  Musculoskeletal: Normal range of motion. He exhibits no edema and no tenderness.  Lymphadenopathy:    He  has no cervical adenopathy.  Neurological: He is alert and oriented to person, place, and time. He has normal reflexes. No cranial nerve deficit. He exhibits normal muscle tone. Coordination normal.  Skin: Skin is warm and dry. No rash noted. No erythema. No pallor.  Psychiatric: He has a normal mood and affect. His behavior is normal. Judgment and thought content normal.    ED Course  Procedures (including critical care time)  Labs Reviewed - No data to display No results found.   Date: 08/24/2012  Rate:62  Rhythm: normal sinus rhythm  QRS Axis: normal   Intervals: normal  ST/T Wave abnormalities: normal  Conduction Disutrbances:incomplete rbbb  Narrative Interpretation:   Old EKG Reviewed: unchanged    1. Benign paroxysmal positional vertigo       MDM  72 year old male with positional vertigo. Each episode has occurred when turning onto his left side. Symptoms resolve within a few minutes of staying in that position.  Hall-Pike performed, slight nystagmus to the left.  We'll refer patient to ENT, give home Epley maneuvers, and meclizine as needed. His neuro exam is otherwise negative. I do not feel this is a posterior circulation stroke or tumor.        Olivia Mackie, MD 08/24/12 0630

## 2012-08-24 NOTE — ED Notes (Signed)
EMS-pt from home with c/o vertigo, dizziness.  States when he went to bed last night everything was fine.  Woke around 2 am and became dizzy after standing.  Denies any medical hx but states bp elevated this am in the 200s.

## 2012-08-24 NOTE — ED Notes (Signed)
Pt dc to home.  Pt states understanding to dc paperwork. Pt ambulatory to exit without difficulty.

## 2012-08-31 DIAGNOSIS — H8190 Unspecified disorder of vestibular function, unspecified ear: Secondary | ICD-10-CM | POA: Diagnosis not present

## 2012-08-31 DIAGNOSIS — H6120 Impacted cerumen, unspecified ear: Secondary | ICD-10-CM | POA: Diagnosis not present

## 2012-09-06 DIAGNOSIS — R972 Elevated prostate specific antigen [PSA]: Secondary | ICD-10-CM | POA: Diagnosis not present

## 2012-09-13 DIAGNOSIS — N529 Male erectile dysfunction, unspecified: Secondary | ICD-10-CM | POA: Diagnosis not present

## 2012-09-13 DIAGNOSIS — R972 Elevated prostate specific antigen [PSA]: Secondary | ICD-10-CM | POA: Diagnosis not present

## 2012-09-14 DIAGNOSIS — R972 Elevated prostate specific antigen [PSA]: Secondary | ICD-10-CM | POA: Diagnosis not present

## 2012-09-19 DIAGNOSIS — J329 Chronic sinusitis, unspecified: Secondary | ICD-10-CM | POA: Diagnosis not present

## 2012-10-04 DIAGNOSIS — E7800 Pure hypercholesterolemia, unspecified: Secondary | ICD-10-CM | POA: Diagnosis not present

## 2012-10-04 DIAGNOSIS — Z131 Encounter for screening for diabetes mellitus: Secondary | ICD-10-CM | POA: Diagnosis not present

## 2012-10-04 DIAGNOSIS — R059 Cough, unspecified: Secondary | ICD-10-CM | POA: Diagnosis not present

## 2012-12-13 DIAGNOSIS — R972 Elevated prostate specific antigen [PSA]: Secondary | ICD-10-CM | POA: Diagnosis not present

## 2012-12-19 DIAGNOSIS — H353290 Exudative age-related macular degeneration, unspecified eye, stage unspecified: Secondary | ICD-10-CM | POA: Diagnosis not present

## 2012-12-20 DIAGNOSIS — R972 Elevated prostate specific antigen [PSA]: Secondary | ICD-10-CM | POA: Diagnosis not present

## 2013-01-02 DIAGNOSIS — R972 Elevated prostate specific antigen [PSA]: Secondary | ICD-10-CM | POA: Diagnosis not present

## 2013-01-02 DIAGNOSIS — Z299 Encounter for prophylactic measures, unspecified: Secondary | ICD-10-CM | POA: Diagnosis not present

## 2013-01-02 DIAGNOSIS — D400 Neoplasm of uncertain behavior of prostate: Secondary | ICD-10-CM | POA: Diagnosis not present

## 2013-01-02 HISTORY — PX: PROSTATE BIOPSY: SHX241

## 2013-01-31 DIAGNOSIS — Z85820 Personal history of malignant melanoma of skin: Secondary | ICD-10-CM | POA: Diagnosis not present

## 2013-01-31 DIAGNOSIS — L821 Other seborrheic keratosis: Secondary | ICD-10-CM | POA: Diagnosis not present

## 2013-01-31 DIAGNOSIS — D239 Other benign neoplasm of skin, unspecified: Secondary | ICD-10-CM | POA: Diagnosis not present

## 2013-01-31 DIAGNOSIS — L819 Disorder of pigmentation, unspecified: Secondary | ICD-10-CM | POA: Diagnosis not present

## 2013-01-31 DIAGNOSIS — D1801 Hemangioma of skin and subcutaneous tissue: Secondary | ICD-10-CM | POA: Diagnosis not present

## 2013-04-25 DIAGNOSIS — R972 Elevated prostate specific antigen [PSA]: Secondary | ICD-10-CM | POA: Diagnosis not present

## 2013-05-02 DIAGNOSIS — N423 Unspecified dysplasia of prostate: Secondary | ICD-10-CM | POA: Diagnosis not present

## 2013-05-02 DIAGNOSIS — R972 Elevated prostate specific antigen [PSA]: Secondary | ICD-10-CM | POA: Diagnosis not present

## 2013-05-30 DIAGNOSIS — C61 Malignant neoplasm of prostate: Secondary | ICD-10-CM

## 2013-05-30 DIAGNOSIS — R972 Elevated prostate specific antigen [PSA]: Secondary | ICD-10-CM | POA: Diagnosis not present

## 2013-05-30 HISTORY — PX: PROSTATE BIOPSY: SHX241

## 2013-05-30 HISTORY — DX: Malignant neoplasm of prostate: C61

## 2013-06-17 DIAGNOSIS — C61 Malignant neoplasm of prostate: Secondary | ICD-10-CM | POA: Diagnosis not present

## 2013-06-18 ENCOUNTER — Encounter: Payer: Self-pay | Admitting: Radiation Oncology

## 2013-06-18 DIAGNOSIS — C61 Malignant neoplasm of prostate: Secondary | ICD-10-CM | POA: Insufficient documentation

## 2013-06-18 NOTE — Progress Notes (Signed)
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   Past/Anticipated interventions by urology, if any: 2 biopsies, PSA surveillance PSA 09/06/12  5.91 08/20/10    5.18 10/20/09    6.17 05/06/09 7.32 04/07/09   7.45 07/03/08  5.61  Past/Anticipated interventions by medical oncology, if any: none  Weight changes, if any: None  Bowel/Bladder complaints, if any:  States intermittent urgency.  Nocturia x 2.  Empty completely "most of the time"  Nausea/Vomiting, if any: no  Pain issues, if any:    SAFETY ISSUES:  Prior radiation? no  Pacemaker/ICD? no  Possible current pregnancy? na  Is the patient on methotrexate? no  Current Complaints / other details:  Married, retired as Environmental manager and [employer], 2 sons, 1 daughter.  History for father and brother with prostate cancer

## 2013-06-19 ENCOUNTER — Ambulatory Visit
Admission: RE | Admit: 2013-06-19 | Discharge: 2013-06-19 | Disposition: A | Discharge status: Home / Self Care | Payer: Medicare Other | Source: Ambulatory Visit | Attending: Radiation Oncology | Admitting: Radiation Oncology

## 2013-06-19 VITALS — BP 145/82 | HR 76 | Temp 98.1°F | Ht 68.0 in | Wt 191.8 lb

## 2013-06-19 DIAGNOSIS — Z96649 Presence of unspecified artificial hip joint: Secondary | ICD-10-CM | POA: Insufficient documentation

## 2013-06-19 DIAGNOSIS — C61 Malignant neoplasm of prostate: Secondary | ICD-10-CM | POA: Insufficient documentation

## 2013-06-19 HISTORY — DX: Unspecified malignant neoplasm of skin, unspecified: C44.90

## 2013-06-19 HISTORY — DX: Unspecified osteoarthritis, unspecified site: M19.90

## 2013-06-19 HISTORY — DX: Malignant neoplasm of prostate: C61

## 2013-06-19 HISTORY — DX: Personal history of urinary calculi: Z87.442

## 2013-06-19 HISTORY — DX: Other microscopic hematuria: R31.29

## 2013-06-19 NOTE — Addendum Note (Signed)
Encounter addended by: Maryln Gottron, MD on: 06/19/2013 10:59 AM<BR>     Documentation filed: Follow-up Section, LOS Section, Clinical Notes, Notes Section

## 2013-06-19 NOTE — Progress Notes (Signed)
[facility] Radiation Oncology NEW PATIENT EVALUATION  Name: Aaron Hawkins MRN: [id number]  Date:   06/19/2013           DOB: Jul 01, 1941  Status: outpatient   CC:  Duane Lope, MD  Milford Cage,*    REFERRING PHYSICIAN: Milford Cage,*   DIAGNOSIS: Stage TI C. favorable risk adenocarcinoma prostate    HISTORY OF PRESENT ILLNESS:  Aaron Hawkins is a 72 y.o. male who is seen today for the courtesy of Dr. Margarita Grizzle for discussion of possible radiation in the management of his stage TI C. favorable risk adenocarcinoma prostate. He presented with an elevated PSA of 5.24 on 09/01/2011. This rose to 5.91 by 09/06/2012 and he underwent ultrasound-guided biopsies by Dr. Margarita Grizzle on 01/02/2013. Biopsies were benign except for a small foci of atypical glands seen along the right lateral base and left apex. A followup PSA on 04/25/2013 was 5.67 with a percentage free PSA of 18%. Repeat biopsies on 05/30/2013 revealed Gleason 6 (3+3) adenocarcinoma involving 5% of one core from right lateral mid gland, 5% of one core from the right mid gland and 40% of one core from the right apex. His gland volume was approximately 49 cc. He is doing well from a GU and GI standpoint. His I PSS score is 7. He does have erectile dysfunction.  PREVIOUS RADIATION THERAPY: No   PAST MEDICAL HISTORY:  has a past medical history of Enlarged prostate without urinary obstruction; Cancer; Arthritis; Ulcer; History of nephrolithiasis; Guillain-Barre syndrome; Hematospermia (12/2009); Hydroureteronephrosis; Microscopic hematuria; Skin cancer (2008); and Prostate cancer (05/30/13).     PAST SURGICAL HISTORY:  Past Surgical History  Procedure Laterality Date  . Hip surgery    . Hernia repair      w/mesh, umbilical, bilateral inguinal  . Tonsillectomy and adenoidectomy    . Total hip arthroplasty    . Melanoma excision  04/04/2007    back  . Prostate biopsy  01/02/13    benign, one small focus  of atypical cells right core  . Prostate biopsy  05/30/13    Gleason 3+3=6, volume 49 cc     FAMILY HISTORY: family history includes Cancer in his brother and father. Family history prostate cancer in his father and brother. His father lived to be 83.a heart attack. He was diagnosed at age 27. His mother died from Alzheimer's disease and 76.   SOCIAL HISTORY:  reports that he quit smoking about 40 years ago. He does not have any smokeless tobacco history on file. He reports that he drinks about 0.6 ounces of alcohol per week. He reports that he does not use illicit drugs. Married, 3 children. He has been in [employer] business most of his life.   ALLERGIES: Review of patient's allergies indicates no known allergies.   MEDICATIONS:  Current Outpatient Prescriptions  Medication Sig Dispense Refill  . aspirin 81 MG tablet Take 81 mg by mouth daily.      . beta carotene w/minerals (OCUVITE) tablet Take 1 tablet by mouth daily.       No current facility-administered medications for this encounter.     REVIEW OF SYSTEMS:  Pertinent items are noted in HPI.    PHYSICAL EXAM:  height is 5\' 8"  (1.727 m) and weight is 191 lb 12.8 oz (87 kg). His temperature is 98.1 F (36.7 C). His blood pressure is 145/82 and his pulse is 76.   Alert and oriented. Head and neck examination: Grossly unremarkable. Nodes:  Without palpable cervical, or supraclavicular lymphadenopathy. Chest: Lungs clear. Back: Without spinal or CVA tenderness. Abdomen: Without masses organomegaly. Genitalia: Unremarkable to inspection. Rectal: The prostate gland is slightly enlarged and is without focal induration or nodularity. Extremities: Without edema. He does have a scar along his right hip from his hip replacement surgery. He has excellent hip flexion.   LABORATORY DATA:  Lab Results  Component Value Date   WBC 5.1 03/30/2011   HGB 16.9 03/30/2011   HCT 48.6 03/30/2011   MCV 91.2 03/30/2011   PLT 167 03/30/2011   Lab  Results  Component Value Date   NA 143 03/30/2011   K 4.3 03/30/2011   CL 102 03/30/2011   CO2 30 03/30/2011   Lab Results  Component Value Date   ALT 13 03/30/2011   AST 17 03/30/2011   ALKPHOS 64 03/30/2011   BILITOT 0.6 03/30/2011   PSA 5.67 from 04/25/2013   IMPRESSION: Stage TI C. favorable risk adenocarcinoma prostate. I explained to the patient and his wife that his prognosis is related to his stage, PSA level, and Gleason score. All are favorable. Other prognostic factors include PSA doubling time and disease volume. These are also favorable. Management options include robotic surgery, close surveillance, or radiation therapy. Ration therapy options include seed implantation alone or 8 weeks of external beam/IMRT. We discussed the potential acute and late toxicities of radiation therapy. We also discussed radiation safety issues with respect to seed implantation. He is really a candidate for surgery, close surveillance, or radiation therapy. He is rather fit for his age and certainly has a 10 year life expectancy. His prognosis is excellent. He was given literature for review. He'll contact Dr. Margarita Grizzle if he wants to proceed with surgery, or me if you want to consider radiation therapy. He understands that he will need a CT arch study if he wants to proceed with seed implantation.   PLAN: As discussed above.  I spent 60 minutes minutes face to face with the patient and more than 50% of that time was spent in counseling and/or coordination of care.

## 2013-06-24 ENCOUNTER — Ambulatory Visit
Admission: RE | Admit: 2013-06-24 | Discharge: 2013-06-24 | Disposition: A | Discharge status: Home / Self Care | Payer: Medicare Other | Source: Ambulatory Visit | Attending: Radiation Oncology | Admitting: Radiation Oncology

## 2013-06-24 ENCOUNTER — Encounter: Payer: Self-pay | Admitting: Radiation Oncology

## 2013-06-24 DIAGNOSIS — Z510 Encounter for antineoplastic radiation therapy: Secondary | ICD-10-CM | POA: Diagnosis not present

## 2013-06-24 DIAGNOSIS — C61 Malignant neoplasm of prostate: Secondary | ICD-10-CM | POA: Insufficient documentation

## 2013-06-24 NOTE — Progress Notes (Signed)
   Complex simulation/treatment planning note: The patient was taken to the CT simulator. His pelvis was scanned. The CT data set was sent to the planning system for contouring of his prostate. His prostate volume measured 60 cc. There is no bony arch interference. I prescribing 14,500 cGy utilizing I-125 seeds with the [employer].

## 2013-06-24 NOTE — Progress Notes (Signed)
CC: Dr. Natalia Leatherwood, Dr. Duane Lope  Chart note: Aaron Hawkins wants to undergo seed implantation. His CT arch study from this morning shows a prostate volume of 60 cc with an open arch. We will proceed with seed implantation with Dr. Margarita Grizzle.

## 2013-06-25 ENCOUNTER — Ambulatory Visit: Payer: Medicare Other | Admitting: Radiation Oncology

## 2013-06-25 ENCOUNTER — Ambulatory Visit: Payer: Medicare Other

## 2013-06-25 ENCOUNTER — Other Ambulatory Visit: Payer: Self-pay | Admitting: Urology

## 2013-06-26 ENCOUNTER — Telehealth: Payer: Self-pay | Admitting: *Deleted

## 2013-06-26 NOTE — Telephone Encounter (Signed)
CALLED PATIENT TO INFORM OF IMPLANT, LVM FOR A RETURN CALL 

## 2013-06-27 ENCOUNTER — Ambulatory Visit (HOSPITAL_COMMUNITY)
Admission: RE | Admit: 2013-06-27 | Discharge: 2013-06-27 | Disposition: A | Discharge status: Home / Self Care | Payer: Medicare Other | Source: Ambulatory Visit | Attending: Urology | Admitting: Urology

## 2013-06-27 DIAGNOSIS — C61 Malignant neoplasm of prostate: Secondary | ICD-10-CM | POA: Diagnosis not present

## 2013-06-27 DIAGNOSIS — J449 Chronic obstructive pulmonary disease, unspecified: Secondary | ICD-10-CM | POA: Diagnosis not present

## 2013-06-27 DIAGNOSIS — Z01810 Encounter for preprocedural cardiovascular examination: Secondary | ICD-10-CM | POA: Insufficient documentation

## 2013-07-25 DIAGNOSIS — C61 Malignant neoplasm of prostate: Secondary | ICD-10-CM | POA: Diagnosis not present

## 2013-07-31 ENCOUNTER — Encounter (HOSPITAL_BASED_OUTPATIENT_CLINIC_OR_DEPARTMENT_OTHER): Payer: Self-pay | Admitting: *Deleted

## 2013-07-31 ENCOUNTER — Telehealth: Payer: Self-pay | Admitting: *Deleted

## 2013-07-31 NOTE — Telephone Encounter (Signed)
Called patient to remind of lab appt for 08-01-13, spoke with patient and he is aware of this appt.

## 2013-08-01 ENCOUNTER — Encounter (HOSPITAL_BASED_OUTPATIENT_CLINIC_OR_DEPARTMENT_OTHER): Payer: Self-pay | Admitting: *Deleted

## 2013-08-01 DIAGNOSIS — L821 Other seborrheic keratosis: Secondary | ICD-10-CM | POA: Diagnosis not present

## 2013-08-01 DIAGNOSIS — D239 Other benign neoplasm of skin, unspecified: Secondary | ICD-10-CM | POA: Diagnosis not present

## 2013-08-01 DIAGNOSIS — Z8711 Personal history of peptic ulcer disease: Secondary | ICD-10-CM | POA: Diagnosis not present

## 2013-08-01 DIAGNOSIS — C61 Malignant neoplasm of prostate: Secondary | ICD-10-CM | POA: Diagnosis not present

## 2013-08-01 DIAGNOSIS — D1801 Hemangioma of skin and subcutaneous tissue: Secondary | ICD-10-CM | POA: Diagnosis not present

## 2013-08-01 DIAGNOSIS — J449 Chronic obstructive pulmonary disease, unspecified: Secondary | ICD-10-CM | POA: Diagnosis not present

## 2013-08-01 DIAGNOSIS — L723 Sebaceous cyst: Secondary | ICD-10-CM | POA: Diagnosis not present

## 2013-08-01 DIAGNOSIS — Z8042 Family history of malignant neoplasm of prostate: Secondary | ICD-10-CM | POA: Diagnosis not present

## 2013-08-01 DIAGNOSIS — Z87891 Personal history of nicotine dependence: Secondary | ICD-10-CM | POA: Diagnosis not present

## 2013-08-01 DIAGNOSIS — Z85820 Personal history of malignant melanoma of skin: Secondary | ICD-10-CM | POA: Diagnosis not present

## 2013-08-01 DIAGNOSIS — L819 Disorder of pigmentation, unspecified: Secondary | ICD-10-CM | POA: Diagnosis not present

## 2013-08-01 LAB — COMPREHENSIVE METABOLIC PANEL
ALT: 10 U/L (ref 0–53)
AST: 17 U/L (ref 0–37)
Albumin: 4 g/dL (ref 3.5–5.2)
Alkaline Phosphatase: 85 U/L (ref 39–117)
BUN: 17 mg/dL (ref 6–23)
CO2: 32 mEq/L (ref 19–32)
Calcium: 9 mg/dL (ref 8.4–10.5)
Chloride: 98 mEq/L (ref 96–112)
Creatinine, Ser: 1.03 mg/dL (ref 0.50–1.35)
GFR calc Af Amer: 81 mL/min — ABNORMAL LOW (ref >90)
GFR calc non Af Amer: 70 mL/min — ABNORMAL LOW (ref >90)
Glucose, Bld: 82 mg/dL (ref 70–99)
Potassium: 4.4 mEq/L (ref 3.7–5.3)
Sodium: 138 mEq/L (ref 137–147)
Total Bilirubin: 0.9 mg/dL (ref 0.3–1.2)
Total Protein: 7.2 g/dL (ref 6.0–8.3)

## 2013-08-01 LAB — CBC
HCT: 52.3 % — ABNORMAL HIGH (ref 39.0–52.0)
Hemoglobin: 18.1 g/dL — ABNORMAL HIGH (ref 13.0–17.0)
MCH: 30.9 pg (ref 26.0–34.0)
MCHC: 34.6 g/dL (ref 30.0–36.0)
MCV: 89.4 fL (ref 78.0–100.0)
Platelets: 161 10*3/uL (ref 150–400)
RBC: 5.85 MIL/uL — ABNORMAL HIGH (ref 4.22–5.81)
RDW: 13.3 % (ref 11.5–15.5)
WBC: 4.3 10*3/uL (ref 4.0–10.5)

## 2013-08-01 LAB — APTT: aPTT: 33 seconds (ref 24–37)

## 2013-08-01 LAB — PROTIME-INR
INR: 1 (ref 0.00–1.49)
Prothrombin Time: 13 seconds (ref 11.6–15.2)

## 2013-08-01 NOTE — Progress Notes (Signed)
To [facility] at 1000-labs,Ekg/Cxr in epic-instructed Npo after Mn-to complete fleet enema am prior to arrival of procedure.

## 2013-08-07 ENCOUNTER — Telehealth: Payer: Self-pay | Admitting: *Deleted

## 2013-08-07 DIAGNOSIS — C61 Malignant neoplasm of prostate: Secondary | ICD-10-CM | POA: Diagnosis not present

## 2013-08-07 NOTE — Telephone Encounter (Signed)
Called patient to remind of procedure for 08-08-13, spoke with patient and he is aware of this procedure.

## 2013-08-08 ENCOUNTER — Encounter (HOSPITAL_BASED_OUTPATIENT_CLINIC_OR_DEPARTMENT_OTHER): Payer: Self-pay

## 2013-08-08 ENCOUNTER — Encounter: Payer: Self-pay | Admitting: Radiation Oncology

## 2013-08-08 ENCOUNTER — Ambulatory Visit (HOSPITAL_BASED_OUTPATIENT_CLINIC_OR_DEPARTMENT_OTHER): Payer: Medicare Other | Admitting: Anesthesiology

## 2013-08-08 ENCOUNTER — Encounter (HOSPITAL_BASED_OUTPATIENT_CLINIC_OR_DEPARTMENT_OTHER): Payer: Medicare Other | Admitting: Anesthesiology

## 2013-08-08 ENCOUNTER — Ambulatory Visit (HOSPITAL_BASED_OUTPATIENT_CLINIC_OR_DEPARTMENT_OTHER)
Admission: RE | Admit: 2013-08-08 | Discharge: 2013-08-08 | Disposition: A | Discharge status: Home / Self Care | Payer: Medicare Other | Source: Ambulatory Visit | Attending: Urology | Admitting: Urology

## 2013-08-08 ENCOUNTER — Encounter (HOSPITAL_BASED_OUTPATIENT_CLINIC_OR_DEPARTMENT_OTHER)
Admission: RE | Disposition: A | Discharge status: Home / Self Care | Payer: Self-pay | Source: Ambulatory Visit | Attending: Urology

## 2013-08-08 ENCOUNTER — Ambulatory Visit (HOSPITAL_COMMUNITY): Payer: Medicare Other

## 2013-08-08 DIAGNOSIS — Z8042 Family history of malignant neoplasm of prostate: Secondary | ICD-10-CM | POA: Diagnosis not present

## 2013-08-08 DIAGNOSIS — Z87891 Personal history of nicotine dependence: Secondary | ICD-10-CM | POA: Insufficient documentation

## 2013-08-08 DIAGNOSIS — J4489 Other specified chronic obstructive pulmonary disease: Secondary | ICD-10-CM | POA: Insufficient documentation

## 2013-08-08 DIAGNOSIS — C61 Malignant neoplasm of prostate: Secondary | ICD-10-CM | POA: Insufficient documentation

## 2013-08-08 DIAGNOSIS — J449 Chronic obstructive pulmonary disease, unspecified: Secondary | ICD-10-CM | POA: Diagnosis not present

## 2013-08-08 DIAGNOSIS — Z85820 Personal history of malignant melanoma of skin: Secondary | ICD-10-CM | POA: Diagnosis not present

## 2013-08-08 DIAGNOSIS — Z8711 Personal history of peptic ulcer disease: Secondary | ICD-10-CM | POA: Insufficient documentation

## 2013-08-08 HISTORY — PX: RADIOACTIVE SEED IMPLANT: SHX5150

## 2013-08-08 HISTORY — DX: Personal history of malignant melanoma of skin: Z85.820

## 2013-08-08 HISTORY — DX: Other specified postprocedural states: Z98.890

## 2013-08-08 HISTORY — DX: Personal history of other diseases of the nervous system and sense organs: Z86.69

## 2013-08-08 HISTORY — DX: Personal history of other diseases of urinary system: Z87.448

## 2013-08-08 SURGERY — INSERTION, RADIATION SOURCE, PROSTATE
Anesthesia: General | Site: Prostate

## 2013-08-08 MED ORDER — FENTANYL CITRATE 0.05 MG/ML IJ SOLN
INTRAMUSCULAR | Status: DC | PRN
  Administered 2013-08-08: 50 ug via INTRAVENOUS
  Administered 2013-08-08 (×5): 25 ug via INTRAVENOUS

## 2013-08-08 MED ORDER — EPHEDRINE SULFATE 50 MG/ML IJ SOLN
INTRAMUSCULAR | Status: DC | PRN
  Administered 2013-08-08 (×2): 10 mg via INTRAVENOUS

## 2013-08-08 MED ORDER — ONDANSETRON HCL 4 MG/2ML IJ SOLN
INTRAMUSCULAR | Status: DC | PRN
  Administered 2013-08-08: 4 mg via INTRAVENOUS

## 2013-08-08 MED ORDER — FLEET ENEMA 7-19 GM/118ML RE ENEM
1.0000 | ENEMA | Freq: Once | RECTAL | Status: DC
  Filled 2013-08-08: qty 1

## 2013-08-08 MED ORDER — HYDROCODONE-ACETAMINOPHEN 5-325 MG PO TABS: 1.0000 | ORAL_TABLET | ORAL | Status: DC | PRN

## 2013-08-08 MED ORDER — CIPROFLOXACIN IN D5W 400 MG/200ML IV SOLN
400.0000 mg | INTRAVENOUS | Status: AC
  Administered 2013-08-08: 400 mg via INTRAVENOUS
  Filled 2013-08-08 (×2): qty 200

## 2013-08-08 MED ORDER — LACTATED RINGERS IV SOLN
INTRAVENOUS | Status: DC
  Filled 2013-08-08: qty 1000

## 2013-08-08 MED ORDER — MIDAZOLAM HCL 5 MG/5ML IJ SOLN
INTRAMUSCULAR | Status: DC | PRN
  Administered 2013-08-08: 1 mg via INTRAVENOUS

## 2013-08-08 MED ORDER — MIDAZOLAM HCL 2 MG/2ML IJ SOLN
INTRAMUSCULAR | Status: AC
  Filled 2013-08-08: qty 2

## 2013-08-08 MED ORDER — LIDOCAINE HCL (CARDIAC) 20 MG/ML IV SOLN
INTRAVENOUS | Status: DC | PRN
  Administered 2013-08-08: 80 mg via INTRAVENOUS

## 2013-08-08 MED ORDER — TAMSULOSIN HCL 0.4 MG PO CAPS: 0.8000 mg | ORAL_CAPSULE | Freq: Every day | ORAL | Status: DC

## 2013-08-08 MED ORDER — IOHEXOL 350 MG/ML SOLN
INTRAVENOUS | Status: DC | PRN
  Administered 2013-08-08: 7 mL

## 2013-08-08 MED ORDER — PHENAZOPYRIDINE HCL 200 MG PO TABS: 200.0000 mg | ORAL_TABLET | Freq: Three times a day (TID) | ORAL | Status: DC | PRN

## 2013-08-08 MED ORDER — LACTATED RINGERS IV SOLN
INTRAVENOUS | Status: DC
  Administered 2013-08-08 (×2): via INTRAVENOUS
  Filled 2013-08-08: qty 1000

## 2013-08-08 MED ORDER — PROPOFOL 10 MG/ML IV BOLUS
INTRAVENOUS | Status: DC | PRN
  Administered 2013-08-08: 200 mg via INTRAVENOUS

## 2013-08-08 MED ORDER — FENTANYL CITRATE 0.05 MG/ML IJ SOLN
25.0000 ug | INTRAMUSCULAR | Status: DC | PRN
  Filled 2013-08-08: qty 1

## 2013-08-08 MED ORDER — PROMETHAZINE HCL 25 MG/ML IJ SOLN
6.2500 mg | INTRAMUSCULAR | Status: DC | PRN
  Filled 2013-08-08: qty 1

## 2013-08-08 MED ORDER — DEXAMETHASONE SODIUM PHOSPHATE 4 MG/ML IJ SOLN
INTRAMUSCULAR | Status: DC | PRN
  Administered 2013-08-08: 10 mg via INTRAVENOUS

## 2013-08-08 MED ORDER — SENNOSIDES-DOCUSATE SODIUM 8.6-50 MG PO TABS: 1.0000 | ORAL_TABLET | Freq: Two times a day (BID) | ORAL | Status: DC

## 2013-08-08 MED ORDER — FENTANYL CITRATE 0.05 MG/ML IJ SOLN
INTRAMUSCULAR | Status: AC
  Filled 2013-08-08: qty 6

## 2013-08-08 MED ORDER — CEPHALEXIN 500 MG PO CAPS
500.0000 mg | ORAL_CAPSULE | Freq: Four times a day (QID) | ORAL | Status: DC
Start: 2013-08-08 — End: 2013-08-28

## 2013-08-08 SURGICAL SUPPLY — 26 items
BAG URINE DRAINAGE (UROLOGICAL SUPPLIES) ×2 IMPLANT
BLADE SURG ROTATE 9660 (MISCELLANEOUS) ×2 IMPLANT
CANISTER SUCT LVC 12 LTR MEDI- (MISCELLANEOUS) IMPLANT
CATH FOLEY 2WAY SLVR  5CC 16FR (CATHETERS) ×2
CATH FOLEY 2WAY SLVR 5CC 16FR (CATHETERS) ×2 IMPLANT
CATH ROBINSON RED A/P 20FR (CATHETERS) ×2 IMPLANT
CLOTH BEACON ORANGE TIMEOUT ST (SAFETY) ×2 IMPLANT
COVER MAYO STAND STRL (DRAPES) ×2 IMPLANT
COVER TABLE BACK 60X90 (DRAPES) ×2 IMPLANT
DRSG TEGADERM 4X4.75 (GAUZE/BANDAGES/DRESSINGS) ×2 IMPLANT
DRSG TEGADERM 8X12 (GAUZE/BANDAGES/DRESSINGS) ×2 IMPLANT
GLOVE BIO SURGEON STRL SZ7.5 (GLOVE) ×7 IMPLANT
GLOVE ECLIPSE 7.0 STRL STRAW (GLOVE) ×2 IMPLANT
GLOVE INDICATOR 7.5 STRL GRN (GLOVE) ×2 IMPLANT
GLOVE SURG SS PI 7.5 STRL IVOR (GLOVE) ×2 IMPLANT
GOWN STRL REIN XL XLG (GOWN DISPOSABLE) ×2 IMPLANT
GOWN STRL REUS W/TWL LRG LVL3 (GOWN DISPOSABLE) ×1 IMPLANT
GOWN STRL REUS W/TWL XL LVL3 (GOWN DISPOSABLE) ×1 IMPLANT
HOLDER FOLEY CATH W/STRAP (MISCELLANEOUS) ×2 IMPLANT
PACK CYSTOSCOPY (CUSTOM PROCEDURE TRAY) ×2 IMPLANT
SPONGE GAUZE 4X4 12PLY (GAUZE/BANDAGES/DRESSINGS) ×1 IMPLANT
SYRINGE 10CC LL (SYRINGE) ×2 IMPLANT
UNDERPAD 30X30 INCONTINENT (UNDERPADS AND DIAPERS) ×4 IMPLANT
WATER STERILE IRR 3000ML UROMA (IV SOLUTION) ×2 IMPLANT
WATER STERILE IRR 500ML POUR (IV SOLUTION) ×2 IMPLANT
nucletron, selectseed ×1 IMPLANT

## 2013-08-08 NOTE — Anesthesia Procedure Notes (Signed)
Procedure Name: LMA Insertion Date/Time: 08/08/2013 11:41 AM Performed by: Denna Haggard D Pre-anesthesia Checklist: Patient identified, Emergency Drugs available, Suction available and Patient being monitored Patient Re-evaluated:Patient Re-evaluated prior to inductionOxygen Delivery Method: Circle System Utilized Preoxygenation: Pre-oxygenation with 100% oxygen Intubation Type: IV induction Ventilation: Mask ventilation without difficulty LMA: LMA inserted LMA Size: 4.0 Number of attempts: 1 Airway Equipment and Method: bite block Placement Confirmation: positive ETCO2 Tube secured with: Tape Dental Injury: Teeth and Oropharynx as per pre-operative assessment

## 2013-08-08 NOTE — Anesthesia Preprocedure Evaluation (Addendum)
Anesthesia Evaluation  Patient identified by MRN, date of birth, ID band Patient awake    Reviewed: Allergy & Precautions, H&P , NPO status , Patient's Chart, lab work & pertinent test results  Airway Mallampati: II TM Distance: >3 FB Neck ROM: Full    Dental  (+) Teeth Intact, Caps and Dental Advisory Given   Pulmonary neg pulmonary ROS, COPDformer smoker,  breath sounds clear to auscultation  Pulmonary exam normal       Cardiovascular negative cardio ROS  Rhythm:Regular Rate:Normal     Neuro/Psych negative neurological ROS  negative psych ROS   GI/Hepatic negative GI ROS, Neg liver ROS,   Endo/Other  negative endocrine ROS  Renal/GU negative Renal ROS  negative genitourinary   Musculoskeletal negative musculoskeletal ROS (+)   Abdominal   Peds negative pediatric ROS (+)  Hematology negative hematology ROS (+)   Anesthesia Other Findings   Reproductive/Obstetrics                          Anesthesia Physical Anesthesia Plan  ASA: II  Anesthesia Plan: General   Post-op Pain Management:    Induction: Intravenous  Airway Management Planned: LMA  Additional Equipment:   Intra-op Plan:   Post-operative Plan: Extubation in OR  Informed Consent: I have reviewed the patients History and Physical, chart, labs and discussed the procedure including the risks, benefits and alternatives for the proposed anesthesia with the patient or authorized representative who has indicated his/her understanding and acceptance.   Dental advisory given  Plan Discussed with: CRNA  Anesthesia Plan Comments:         Anesthesia Quick Evaluation

## 2013-08-08 NOTE — Discharge Instructions (Signed)
DISCHARGE INSTRUCTIONS FOR PROSTATE SEED IMPLANTATION  Removal of catheter Remove the foley catheter after 24 hours ( day after the procedure).can be done easily by cutting the side port of the catheter, whichallow the balloon to deflate.  You will see 1-2 teaspoons of clear water as the balloon deflates and then the catheter can be slid out without difficulty.        Cut here  Antibiotics You may be given a prescription for an antibiotic to take when you arrive home. If so, be sure to take every tablet in the bottle, even if you are feeling better before the prescription is finished. If you begin itching, notice a rash or start to swell on your trunk, arms, legs and/or throat, immediately stop taking the antibiotic and call your Urologist. Diet Resume your usual diet when you return home. To keep your bowels moving easily and softly, drink prune, apple and cranberry juice at room temperature. You may also take a stool softener, such as Colace, which is available without prescription at local pharmacies. Bathing You may shower on the second day after surgery.  Do not take a bath or submerge your genitals or the area between your legs for 2 weeks. Daily activities  No driving or heavy lifting for at least two days after the implant.  No bike riding, horseback riding or riding lawn mowers for the first month after the implant.  Any strenuous physical activity should be approved by your doctor before you resume it. Sexual relations You may resume sexual relations two weeks after the procedure. A condom should be used for the first two weeks. Your semen may be dark brown or black; this is normal and is related bleeding that may have occurred during the implant. Postoperative swelling Expect swelling and bruising of the scrotum and perineum (the area between the scrotum and anus). Both the swelling and the bruising should resolve in l or 2 weeks. Ice packs and over- the-counter medications such as  Tylenol, Advil or Aleve may lessen your discomfort. Postoperative urination Most men experience burning on urination and/or urinary frequency. If this becomes bothersome, contact your Urologist.  Medication can be prescribed to relieve these problems.  It is normal to have some blood in your urine for a few days after the implant. Special instructions related to the seeds It is unlikely that you will pass an Iodine-125 seed in your urine. The seeds are silver in color and are about as large as a grain of rice. If you pass a seed, do not handle it with your fingers. Use a spoon to place it in an envelope or jar in place this in base occluded area such as the garage or basement for return to the radiation clinic at your convenience.  Contact your doctor for  Temperature greater than 101 F  Increasing pain  Inability to urinate Follow-up  You should have follow up with your urologist and radiation oncologist about 3 weeks after the procedure. General information regarding Iodine seeds  Iodine-125 is a low energy radioactive material. It is not deeply penetrating and loses energy at short distances. Your prostate will absorb the radiation. Objects that are touched or used by the patient do not become radioactive.  Body wastes (urine and stool) or body fluids (saliva, tears, semen or blood) are not radioactive.  The Nuclear Regulatory Commission [facility]) has determined that no radiation precautions are needed for patients undergoing Iodine-125 seed implantation. The [facility] states that such patients do not present  a risk to the people around them, including young children and pregnant women. However, in keeping with the general principle that radiation exposure should be kept as low reasonably possible, we suggest the following:  Children and pets should not sit on the patient's lap for the first two (2) weeks after the implant.  Pregnant (or possibly pregnant) women should avoid prolonged, close  contact with the patient for the first two (2) weeks after the implant.  A distance of three (3) feet is acceptable.  At a distance of three (3) feet, there is no limit to the length of time anyone can be with the patient.   Post Anesthesia Home Care Instructions  Activity: Get plenty of rest for the remainder of the day. A responsible adult should stay with you for 24 hours following the procedure.  For the next 24 hours, DO NOT: -Drive a car -Paediatric nurse -Drink alcoholic beverages -Take any medication unless instructed by your physician -Make any legal decisions or sign important papers.  Meals: Start with liquid foods such as gelatin or soup. Progress to regular foods as tolerated. Avoid greasy, spicy, heavy foods. If nausea and/or vomiting occur, drink only clear liquids until the nausea and/or vomiting subsides. Call your physician if vomiting continues.  Special Instructions/Symptoms: Your throat may feel dry or sore from the anesthesia or the breathing tube placed in your throat during surgery. If this causes discomfort, gargle with warm salt water. The discomfort should disappear within 24 hours.

## 2013-08-08 NOTE — Anesthesia Postprocedure Evaluation (Signed)
  Anesthesia Post-op Note  Patient: Aaron Hawkins  Procedure(s) Performed: Procedure(s) (LRB): RADIOACTIVE SEED IMPLANT (N/A)  Patient Location: PACU  Anesthesia Type: General  Level of Consciousness: awake and alert   Airway and Oxygen Therapy: Patient Spontanous Breathing  Post-op Pain: mild  Post-op Assessment: Post-op Vital signs reviewed, Patient's Cardiovascular Status Stable, Respiratory Function Stable, Patent Airway and No signs of Nausea or vomiting  Last Vitals:  Filed Vitals:   08/08/13 1430  BP: 142/86  Pulse: 95  Temp:   Resp: 18    Post-op Vital Signs: stable   Complications: No apparent anesthesia complications

## 2013-08-08 NOTE — Progress Notes (Signed)
CC: Dr. Linton Flemings, Dr. Melinda Crutch  Diagnosis: Stage TI C. favorable risk adenocarcinoma prostate  Intent: Curative  Prostate seed implant date: 08/08/2013  Isotope: I-125 utilizing 80 seeds in 21 active needles. Individual seed activity 0.59 mCi per seed for a total implant activity of 47.4 mCi.  Narrative: The patient appears to have undergone a successful Nucletron seed Selectron implant with Dr. Jasmine December.  Plan: Followup visit with Dr. Jasmine December in 1-2 weeks, and with me in approximately 3 weeks. At that time we will obtain a CT scan for his post implant dosimetry.

## 2013-08-08 NOTE — H&P (Signed)
Urology History and Physical Exam  CC: Aaron Hawkins  HPI:  73 year old male presents today for treatment of his Aaron Hawkins.  This was discovered based on elevated PSA to 5.67 in October 2014.  He has a positive family history of Aaron Hawkins.  Biopsy in November 2014 revealed Gleason 3+3 = 6 involving 3 cores in as much as 40% of the biopsies.  Staging was negative for metastatic disease.  He was counseled regarding treatment options.  This is not associated with gross hematuria or weight loss.  He presents today for transperineal ultrasound-guided placement of therapy seeds and cystoscopy.  We have discussed the risks, benefits, alternatives, and likelihood of achieving goals.  UA 07/25/13 was negative for signs of infection.  PMH: Past Medical History  Diagnosis Date  . Enlarged Aaron without urinary obstruction   . Arthritis   . History of nephrolithiasis     right kidney  . Microscopic hematuria     history of  . History of gastric ulcer   . History of hydronephrosis     W/ STRICTURE  . History of Guillain-Barre syndrome 2003  . Aaron Hawkins 05/30/13    gleason 3+3=6, volume 58 cc  . History of melanoma excision     BACK-- 04-04-2007 W/ SLN BX RIGHT AXILL  . COPD (chronic obstructive pulmonary disease)     PSH: Past Surgical History  Procedure Laterality Date  . Total hip arthroplasty Right 01-06-2009  . Melanoma excision  04/04/2007    back W/ SLN BX RIGHT AXILL  . Aaron biopsy  01/02/13    benign, one small focus of atypical cells right core  . Aaron biopsy  05/30/13    Gleason 3+3=6, volume 49 cc  . Tonsillectomy and adenoidectomy  childhood  . Hernia repair  1610'R    w/mesh, umbilical, bilateral inguinal  . Joint replacement Right 2010    Allergies: No Known Allergies  Medications: No prescriptions prior to admission     Social History: History   Social History  . Marital Status: Married    Spouse Name: N/A    Number of Children:  N/A  . Years of Education: N/A   Occupational History  . Not on file.   Social History Main Topics  . Smoking status: Former Smoker -- 5 years    Quit date: 06/18/1973  . Smokeless tobacco: Not on file  . Alcohol Use: 0.6 oz/week    1 Glasses of wine per week     Comment: occasional  . Drug Use: No  . Sexual Activity: Not on file   Other Topics Concern  . Not on file   Social History Narrative  . No narrative on file    Family History: Family History  Problem Relation Age of Onset  . Hawkins Father     Aaron  . Hawkins Brother     Aaron    Review of Systems: Positive: None. Negative: Fever, SOB, or chest pain.  A further 10 point review of systems was negative except what is listed in the HPI.  Physical Exam: Filed Vitals:   08/08/13 1002  BP: 154/92  Pulse: 71  Temp: 97 F (36.1 C)  Resp: 18    General: No acute distress.  Awake. Head:  Normocephalic.  Atraumatic. ENT:  EOMI.  Mucous membranes moist Neck:  Supple.  No lymphadenopathy. CV:  S1 present. S2 present. Regular rate. Pulmonary: Equal effort bilaterally.  Clear to auscultation bilaterally. Abdomen: Soft.  Non- tender to palpation. Skin:  Normal turgor.  No visible rash. Extremity: No gross deformity of bilateral upper extremities.  No gross deformity of    bilateral lower extremities. Neurologic: Alert. Appropriate mood.   Studies:  No results found for this basename: HGB, WBC, PLT,  in the last 72 hours  No results found for this basename: NA, K, CL, CO2, BUN, CREATININE, CALCIUM, MAGNESIUM, GFRNONAA, GFRAA,  in the last 72 hours   No results found for this basename: PT, INR, APTT,  in the last 72 hours   No components found with this basename: ABG,     Assessment:  Aaron Hawkins.  Plan: To OR for transperineal ultrasound-guided placement of brachytherapy seeds, transrectal [place] and cystoscopy.

## 2013-08-08 NOTE — Progress Notes (Signed)
[facility] Brachytherapy Operative Procedure Note  Name: MIGEL HANNIS MRN: [id number]  Date:   06/25/2013           DOB: 09-06-1940  Status:outpatient    CC: Melinda Crutch, MD  Dr. Rolan Bucco DIAGNOSIS: A 73 year old gentlemen with stage T1 C. adenocarcinoma of the prostate with a Gleason of 6 and a PSA of 5.67.  PROCEDURE: Insertion of radioactive I-125 seeds into the prostate gland.  RADIATION DOSE: 145 Gy, definitive therapy.  TECHNIQUE: NEDIM OKI was brought to the operating room with Dr. Jasmine December. He was placed in the dorsolithotomy position. He was catheterized and a rectal tube was inserted. The perineum was shaved, prepped and draped. The ultrasound probe was then introduced into the rectum to see the prostate gland.  TREATMENT DEVICE: A needle grid was attached to the ultrasound probe stand and anchor needles were placed.  COMPLEX ISODOSE CALCULATION: The prostate was imaged in 3D using a sagittal sweep of the prostate probe. These images were transferred to the planning computer. There, the prostate, urethra and rectum were defined on each axial reconstructed image. Then, the software created an optimized plan and a few seed positions were adjusted. Then the accepted plan was uploaded to the seed Selectron afterloading unit.  SPECIAL TREATMENT PROCEDURE/SUPERVISION AND HANDLING: The Nucletron FIRST system was used to place the needles under sagittal guidance. A total of 21 needles were used to deposit 80 seeds in the prostate gland. The individual seed activity was 0.59 mCi for a total implant activity of 47.4 mCi.  COMPLEX SIMULATION: At the end of the procedure, an anterior radiograph of the pelvis was obtained to document seed positioning and count. Cystoscopy was performed to check the urethra and bladder.  MICRODOSIMETRY: At the end of the procedure, the patient was emitting 0.11 mrem/hr at 1 meter. Accordingly, he was considered  safe for hospital discharge.  PLAN: The patient will return to the radiation oncology clinic for post implant CT dosimetry in three weeks.

## 2013-08-08 NOTE — Transfer of Care (Signed)
Immediate Anesthesia Transfer of Care Note  Patient: Aaron Hawkins  Procedure(s) Performed: Procedure(s) (LRB): RADIOACTIVE SEED IMPLANT (N/A)  Patient Location: PACU  Anesthesia Type: General  Level of Consciousness: awake, alert  and oriented  Airway & Oxygen Therapy: Patient Spontanous Breathing and Patient connected to face mask oxygen  Post-op Assessment: Report given to PACU RN and Post -op Vital signs reviewed and stable  Post vital signs: Reviewed and stable  Complications: No apparent anesthesia complications

## 2013-08-08 NOTE — Op Note (Signed)
Date of Procedure: 08/08/13       Operative Report  Surgeon: Rolan Bucco, MD  Assistant: Arloa Koh, MD with Radiation Oncology.  Preoperative Diagnosis: Prostate cancer  Postoperative Diagnosis: Prostate cancer  Procedure Performed: Placement of radioactive seed implantation into the prostate. Cystoscopy  Estimated Blood Loss: Minimal  Drains:  Foley catheter.  Specimen: None  Complications: None  Findings:  No spacers or seeds in the urethra or bladder. Clear efflux from ureters bilaterally.  History of Present Illness: This is a pleasant 73 year old male with prostate cancer. He was counseled regarding his treatment options and has elected for brachytherapy. He presents today for that procedure.  Procedure: I was called to the operating room once the patient had already been positioned in the dorsal lithotomy position under the direction of Dr. Tammi Klippel. His genitals and perineum had been prepped and draped in the usual sterile fashion. General anesthesia had already been induced.  The rectal probe along with the frame and the prostate seed placement device as well as the anchoring needles had already been placed. A separate timeout was performed in which the surgical procedure, the patient, and location were all identified and agreed upon by the team. Following this, there was placement under ultrasound guidance of brachytherapy seeds transperineally. A total of 21 activated needles were used. The total number of seeds implanted were 80. Seed type was Iodine-125.   After placement of all the seeds and fluoroscopy of the patient's pelvis, flexible cystoscopy was performed. There were no seeds or spacers in the urethra or bladder.  Clear efflux from the ureters bilaterally.  After this, the cystoscope was removed and a Foley catheter was placed with 10 cc of sterile water in the balloon. This completed the procedure. The patient was placed back in a supine position,  and anesthesia reversed. He was taken to the PACU in stable condition.   All counts were correct at the end of the case.

## 2013-08-09 ENCOUNTER — Encounter (HOSPITAL_BASED_OUTPATIENT_CLINIC_OR_DEPARTMENT_OTHER): Payer: Self-pay | Admitting: Urology

## 2013-08-16 DIAGNOSIS — R3915 Urgency of urination: Secondary | ICD-10-CM | POA: Diagnosis not present

## 2013-08-20 ENCOUNTER — Encounter: Payer: Self-pay | Admitting: Radiation Oncology

## 2013-08-20 NOTE — Progress Notes (Signed)
[employer] forms completed by physician.  Called patient and he will pick up when he comes for his appointment on 08/28/13.

## 2013-08-27 ENCOUNTER — Other Ambulatory Visit: Payer: Self-pay | Admitting: Radiation Oncology

## 2013-08-27 ENCOUNTER — Ambulatory Visit: Payer: Medicare Other | Admitting: Radiation Oncology

## 2013-08-27 ENCOUNTER — Telehealth: Payer: Self-pay | Admitting: *Deleted

## 2013-08-27 NOTE — Telephone Encounter (Signed)
CALLED PATIENT TO REMIND OF APPTS. FOR 08-28-13, SPOKE WITH PATIENT AND HE IS AWARE OF THESE APPTS.

## 2013-08-28 ENCOUNTER — Ambulatory Visit
Admission: RE | Admit: 2013-08-28 | Discharge: 2013-08-28 | Disposition: A | Discharge status: Home / Self Care | Payer: Medicare Other | Source: Ambulatory Visit | Attending: Radiation Oncology | Admitting: Radiation Oncology

## 2013-08-28 ENCOUNTER — Encounter: Payer: Self-pay | Admitting: Radiation Oncology

## 2013-08-28 VITALS — BP 127/83 | HR 75 | Temp 97.4°F | Resp 20 | Ht 68.0 in | Wt 193.7 lb

## 2013-08-28 DIAGNOSIS — C61 Malignant neoplasm of prostate: Secondary | ICD-10-CM

## 2013-08-28 DIAGNOSIS — Z510 Encounter for antineoplastic radiation therapy: Secondary | ICD-10-CM | POA: Diagnosis not present

## 2013-08-28 NOTE — Progress Notes (Signed)
CC: Dr. Rolan Bucco  Followup note: [name] returns today approximately 3 weeks following his prostate seed implant in the management of his stage TI C. favorable risk adenocarcinoma prostate. He does report worsening urinary frequency and urgency with some slowing of the stream. He is taking 2 tablets of tamsulosin a day. He does have prostatic region discomfort when bending forward but not when standing or walking. His nocturia every hour and a half. No GI difficulties although he does have 2-3 bowel movements a day. His CAT scan for his post implant dosimetry shows what appears to be an excellent seed distribution although there are 3-4 seeds adjacent to the bladder perhaps adding to his irritability. Physical examination: Alert and oriented. Filed Vitals:   08/28/13 1009  BP: 127/83  Pulse: 75  Temp: 97.4 F (36.3 C)  Resp: 20   Rectal examination not performed today.  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 I told him to continue with his tamsulosin on a twice a day schedule. I also suggested that he take Aleve 3 times a day.  Plan: Followup visit with Dr. Jasmine Hawkins in April, and I will see him back for a two-month followup visit as well to follow his urinary progress. We'll move ahead with his post implant dosimetry and for the results of Dr. Jasmine Hawkins.

## 2013-08-28 NOTE — Progress Notes (Signed)
Complex simulation note: The patient was taken to the CT simulator. His pelvis was scanned. There appeared to be a favorable seed distribution. The CT data set was sent to the [place] system for contouring of his prostate and rectum. We'll then proceed with post implant dosimetry/3-D simulation to assess the quality of his implant.

## 2013-08-28 NOTE — Progress Notes (Addendum)
followup s/p prostate seed implant done 08/08/13 with Dr.Murray and Dr. Rolan Bucco, 89 seeds implanted,  bowel movements 2-3 x day now, , has slow stream at times, when sitting has discomfort and   has increased urgency to void, doesn't happen when standing or walking,  he has to void 2-3x an hour, never last longer than 1 hour to void, pain at end of voiding, nocturia q 1.5 hour at night Appetite  Good, no nausea, walks dog up to 1 mile a day Fatigued mild  10:16 AM

## 2013-09-05 ENCOUNTER — Encounter: Payer: Self-pay | Admitting: Radiation Oncology

## 2013-09-05 DIAGNOSIS — Z510 Encounter for antineoplastic radiation therapy: Secondary | ICD-10-CM | POA: Diagnosis not present

## 2013-09-05 DIAGNOSIS — C61 Malignant neoplasm of prostate: Secondary | ICD-10-CM | POA: Diagnosis not present

## 2013-09-05 NOTE — Progress Notes (Signed)
CC: Dr. Rolan Bucco  Post implant CT dosimetry/3-D simulation note: The patient underwent post-implant dosimetry/3-D simulation to assess the quality of his implant. Dose volume histograms were obtained for the prostate and rectum. His intraoperative prostate volume by ultrasound was 67.5 cc and his postoperative prostate volume by CT was 67.8 cc, an excellent correlation. His prostate D 90 is 107.1% and V100 94.2%, both excellent. Only 0.31 cc of rectum received the prescribed dose of 14,500 cGy. In summary, the patient has excellent post implant dosimetry with a low risk for late rectal toxicity.

## 2013-10-22 DIAGNOSIS — C61 Malignant neoplasm of prostate: Secondary | ICD-10-CM | POA: Diagnosis not present

## 2013-10-31 DIAGNOSIS — C61 Malignant neoplasm of prostate: Secondary | ICD-10-CM | POA: Diagnosis not present

## 2013-11-05 ENCOUNTER — Ambulatory Visit
Admission: RE | Admit: 2013-11-05 | Discharge: 2013-11-05 | Disposition: A | Discharge status: Home / Self Care | Payer: Medicare Other | Source: Ambulatory Visit | Attending: Radiation Oncology | Admitting: Radiation Oncology

## 2013-11-05 ENCOUNTER — Encounter: Payer: Self-pay | Admitting: Radiation Oncology

## 2013-11-05 VITALS — BP 122/79 | HR 89 | Temp 98.2°F | Resp 20 | Wt 191.5 lb

## 2013-11-05 DIAGNOSIS — C61 Malignant neoplasm of prostate: Secondary | ICD-10-CM

## 2013-11-05 NOTE — Progress Notes (Signed)
CC: Dr. Duke Salvia  Followup note: Mr. Delisle returns today approximately 3 months following seed implantation in the management of his stage TI C. favorable risk adenocarcinoma prostate. When I saw him for a three-week followup visit he was having a fair amount of radiation urethritis, and thus I had him return to see me today. He continues with his tamsulosin twice a day. He states that his urinary frequency and nocturia are now back to his baseline status. Of note is that his post implant dosimetry was excellent. He saw Dr. Jasmine December for a followup visit on April 16 in his PSA was down to 2.25 on April 7.  Physical examination: Alert and oriented. Filed Vitals:   11/05/13 1556  BP: 122/79  Pulse: 89  Temp: 98.2 F (36.8 C)  Resp: 20   Rectal examination not performed today.  Impression: Resolving radiation urethritis. He is now back to his baseline status. The patient tells me he will see Burman Nieves for future followup visits with [facility]. I've not scheduled the patient for a formal followup visit and I ask that Dr. Louis Meckel keep me posted on his progress.

## 2013-11-05 NOTE — Progress Notes (Signed)
Pt denies pain, fatigue, loss of appetite, bowel issues. He states he continues to have urinary frequency, nocturia x 3 which is "his normal". Pt taking Flomax. PSA on 10/22/13 = 2.25

## 2013-11-06 ENCOUNTER — Ambulatory Visit: Payer: Medicare Other | Admitting: Radiation Oncology

## 2014-01-02 NOTE — Addendum Note (Signed)
Encounter addended by: Andria Rhein, RN on: 01/02/2014  8:50 AM<BR>     Documentation filed: Inpatient Document Flowsheet

## 2014-01-30 DIAGNOSIS — C61 Malignant neoplasm of prostate: Secondary | ICD-10-CM | POA: Diagnosis not present

## 2014-02-06 DIAGNOSIS — C61 Malignant neoplasm of prostate: Secondary | ICD-10-CM | POA: Diagnosis not present

## 2014-02-06 DIAGNOSIS — N529 Male erectile dysfunction, unspecified: Secondary | ICD-10-CM | POA: Diagnosis not present

## 2014-02-06 DIAGNOSIS — R3915 Urgency of urination: Secondary | ICD-10-CM | POA: Diagnosis not present

## 2014-02-27 DIAGNOSIS — D1801 Hemangioma of skin and subcutaneous tissue: Secondary | ICD-10-CM | POA: Diagnosis not present

## 2014-02-27 DIAGNOSIS — D239 Other benign neoplasm of skin, unspecified: Secondary | ICD-10-CM | POA: Diagnosis not present

## 2014-02-27 DIAGNOSIS — L821 Other seborrheic keratosis: Secondary | ICD-10-CM | POA: Diagnosis not present

## 2014-02-27 DIAGNOSIS — L819 Disorder of pigmentation, unspecified: Secondary | ICD-10-CM | POA: Diagnosis not present

## 2014-02-27 DIAGNOSIS — Z85820 Personal history of malignant melanoma of skin: Secondary | ICD-10-CM | POA: Diagnosis not present

## 2014-08-04 DIAGNOSIS — C61 Malignant neoplasm of prostate: Secondary | ICD-10-CM | POA: Diagnosis not present

## 2014-08-11 DIAGNOSIS — N5201 Erectile dysfunction due to arterial insufficiency: Secondary | ICD-10-CM | POA: Diagnosis not present

## 2014-08-11 DIAGNOSIS — R3915 Urgency of urination: Secondary | ICD-10-CM | POA: Diagnosis not present

## 2014-08-11 DIAGNOSIS — C61 Malignant neoplasm of prostate: Secondary | ICD-10-CM | POA: Diagnosis not present

## 2014-08-19 DIAGNOSIS — D1801 Hemangioma of skin and subcutaneous tissue: Secondary | ICD-10-CM | POA: Diagnosis not present

## 2014-08-19 DIAGNOSIS — D225 Melanocytic nevi of trunk: Secondary | ICD-10-CM | POA: Diagnosis not present

## 2014-08-19 DIAGNOSIS — L821 Other seborrheic keratosis: Secondary | ICD-10-CM | POA: Diagnosis not present

## 2014-08-19 DIAGNOSIS — L812 Freckles: Secondary | ICD-10-CM | POA: Diagnosis not present

## 2014-08-19 DIAGNOSIS — Z85820 Personal history of malignant melanoma of skin: Secondary | ICD-10-CM | POA: Diagnosis not present

## 2014-08-19 DIAGNOSIS — D2271 Melanocytic nevi of right lower limb, including hip: Secondary | ICD-10-CM | POA: Diagnosis not present

## 2014-08-19 DIAGNOSIS — D2272 Melanocytic nevi of left lower limb, including hip: Secondary | ICD-10-CM | POA: Diagnosis not present

## 2014-10-24 DIAGNOSIS — R05 Cough: Secondary | ICD-10-CM | POA: Diagnosis not present

## 2014-12-05 DIAGNOSIS — R05 Cough: Secondary | ICD-10-CM | POA: Diagnosis not present

## 2015-02-17 DIAGNOSIS — L821 Other seborrheic keratosis: Secondary | ICD-10-CM | POA: Diagnosis not present

## 2015-02-17 DIAGNOSIS — L812 Freckles: Secondary | ICD-10-CM | POA: Diagnosis not present

## 2015-02-17 DIAGNOSIS — D1801 Hemangioma of skin and subcutaneous tissue: Secondary | ICD-10-CM | POA: Diagnosis not present

## 2015-02-17 DIAGNOSIS — Z85820 Personal history of malignant melanoma of skin: Secondary | ICD-10-CM | POA: Diagnosis not present

## 2015-02-19 DIAGNOSIS — C61 Malignant neoplasm of prostate: Secondary | ICD-10-CM | POA: Diagnosis not present

## 2015-02-19 DIAGNOSIS — H2513 Age-related nuclear cataract, bilateral: Secondary | ICD-10-CM | POA: Diagnosis not present

## 2015-02-19 DIAGNOSIS — H3531 Nonexudative age-related macular degeneration: Secondary | ICD-10-CM | POA: Diagnosis not present

## 2015-02-19 DIAGNOSIS — H524 Presbyopia: Secondary | ICD-10-CM | POA: Diagnosis not present

## 2015-03-02 DIAGNOSIS — C61 Malignant neoplasm of prostate: Secondary | ICD-10-CM | POA: Diagnosis not present

## 2015-03-31 DIAGNOSIS — R030 Elevated blood-pressure reading, without diagnosis of hypertension: Secondary | ICD-10-CM | POA: Diagnosis not present

## 2015-03-31 DIAGNOSIS — C61 Malignant neoplasm of prostate: Secondary | ICD-10-CM | POA: Diagnosis not present

## 2015-03-31 DIAGNOSIS — Z131 Encounter for screening for diabetes mellitus: Secondary | ICD-10-CM | POA: Diagnosis not present

## 2015-03-31 DIAGNOSIS — E780 Pure hypercholesterolemia: Secondary | ICD-10-CM | POA: Diagnosis not present

## 2015-07-08 DIAGNOSIS — J069 Acute upper respiratory infection, unspecified: Secondary | ICD-10-CM | POA: Diagnosis not present

## 2015-08-17 DIAGNOSIS — C61 Malignant neoplasm of prostate: Secondary | ICD-10-CM | POA: Diagnosis not present

## 2015-08-24 DIAGNOSIS — Z85820 Personal history of malignant melanoma of skin: Secondary | ICD-10-CM | POA: Diagnosis not present

## 2015-08-24 DIAGNOSIS — Z0000 Encounter for general adult medical examination without abnormal findings: Secondary | ICD-10-CM | POA: Diagnosis not present

## 2015-08-24 DIAGNOSIS — D1801 Hemangioma of skin and subcutaneous tissue: Secondary | ICD-10-CM | POA: Diagnosis not present

## 2015-08-24 DIAGNOSIS — L821 Other seborrheic keratosis: Secondary | ICD-10-CM | POA: Diagnosis not present

## 2015-08-24 DIAGNOSIS — D225 Melanocytic nevi of trunk: Secondary | ICD-10-CM | POA: Diagnosis not present

## 2015-08-24 DIAGNOSIS — D2272 Melanocytic nevi of left lower limb, including hip: Secondary | ICD-10-CM | POA: Diagnosis not present

## 2015-08-24 DIAGNOSIS — Z8546 Personal history of malignant neoplasm of prostate: Secondary | ICD-10-CM | POA: Diagnosis not present

## 2015-08-24 DIAGNOSIS — C61 Malignant neoplasm of prostate: Secondary | ICD-10-CM | POA: Diagnosis not present

## 2015-08-24 DIAGNOSIS — D2271 Melanocytic nevi of right lower limb, including hip: Secondary | ICD-10-CM | POA: Diagnosis not present

## 2015-08-24 DIAGNOSIS — L812 Freckles: Secondary | ICD-10-CM | POA: Diagnosis not present

## 2015-10-06 DIAGNOSIS — R030 Elevated blood-pressure reading, without diagnosis of hypertension: Secondary | ICD-10-CM | POA: Diagnosis not present

## 2016-01-21 DIAGNOSIS — N3281 Overactive bladder: Secondary | ICD-10-CM | POA: Diagnosis not present

## 2016-02-15 DIAGNOSIS — Z8546 Personal history of malignant neoplasm of prostate: Secondary | ICD-10-CM | POA: Diagnosis not present

## 2016-02-19 DIAGNOSIS — Z8546 Personal history of malignant neoplasm of prostate: Secondary | ICD-10-CM | POA: Diagnosis not present

## 2016-02-19 DIAGNOSIS — R350 Frequency of micturition: Secondary | ICD-10-CM | POA: Diagnosis not present

## 2016-02-25 DIAGNOSIS — H2513 Age-related nuclear cataract, bilateral: Secondary | ICD-10-CM | POA: Diagnosis not present

## 2016-02-25 DIAGNOSIS — H524 Presbyopia: Secondary | ICD-10-CM | POA: Diagnosis not present

## 2016-02-25 DIAGNOSIS — H353132 Nonexudative age-related macular degeneration, bilateral, intermediate dry stage: Secondary | ICD-10-CM | POA: Diagnosis not present

## 2016-02-29 DIAGNOSIS — L821 Other seborrheic keratosis: Secondary | ICD-10-CM | POA: Diagnosis not present

## 2016-02-29 DIAGNOSIS — D1801 Hemangioma of skin and subcutaneous tissue: Secondary | ICD-10-CM | POA: Diagnosis not present

## 2016-02-29 DIAGNOSIS — D485 Neoplasm of uncertain behavior of skin: Secondary | ICD-10-CM | POA: Diagnosis not present

## 2016-02-29 DIAGNOSIS — D225 Melanocytic nevi of trunk: Secondary | ICD-10-CM | POA: Diagnosis not present

## 2016-02-29 DIAGNOSIS — Z85820 Personal history of malignant melanoma of skin: Secondary | ICD-10-CM | POA: Diagnosis not present

## 2016-02-29 DIAGNOSIS — C4361 Malignant melanoma of right upper limb, including shoulder: Secondary | ICD-10-CM | POA: Diagnosis not present

## 2016-02-29 DIAGNOSIS — D2261 Melanocytic nevi of right upper limb, including shoulder: Secondary | ICD-10-CM | POA: Diagnosis not present

## 2016-03-10 DIAGNOSIS — Z85820 Personal history of malignant melanoma of skin: Secondary | ICD-10-CM | POA: Diagnosis not present

## 2016-03-10 DIAGNOSIS — L988 Other specified disorders of the skin and subcutaneous tissue: Secondary | ICD-10-CM | POA: Diagnosis not present

## 2016-03-10 DIAGNOSIS — C4361 Malignant melanoma of right upper limb, including shoulder: Secondary | ICD-10-CM | POA: Diagnosis not present

## 2016-04-20 DIAGNOSIS — Z0000 Encounter for general adult medical examination without abnormal findings: Secondary | ICD-10-CM | POA: Diagnosis not present

## 2016-04-20 DIAGNOSIS — E7800 Pure hypercholesterolemia, unspecified: Secondary | ICD-10-CM | POA: Diagnosis not present

## 2016-04-20 DIAGNOSIS — Z131 Encounter for screening for diabetes mellitus: Secondary | ICD-10-CM | POA: Diagnosis not present

## 2016-04-20 DIAGNOSIS — C61 Malignant neoplasm of prostate: Secondary | ICD-10-CM | POA: Diagnosis not present

## 2016-05-04 DIAGNOSIS — H2513 Age-related nuclear cataract, bilateral: Secondary | ICD-10-CM | POA: Diagnosis not present

## 2016-05-04 DIAGNOSIS — H353132 Nonexudative age-related macular degeneration, bilateral, intermediate dry stage: Secondary | ICD-10-CM | POA: Diagnosis not present

## 2016-05-04 DIAGNOSIS — H524 Presbyopia: Secondary | ICD-10-CM | POA: Diagnosis not present

## 2016-06-24 DIAGNOSIS — M25561 Pain in right knee: Secondary | ICD-10-CM | POA: Diagnosis not present

## 2016-08-08 DIAGNOSIS — M7051 Other bursitis of knee, right knee: Secondary | ICD-10-CM | POA: Diagnosis not present

## 2016-08-16 DIAGNOSIS — R310 Gross hematuria: Secondary | ICD-10-CM | POA: Diagnosis not present

## 2016-08-16 DIAGNOSIS — Z8546 Personal history of malignant neoplasm of prostate: Secondary | ICD-10-CM | POA: Diagnosis not present

## 2016-08-19 DIAGNOSIS — R310 Gross hematuria: Secondary | ICD-10-CM | POA: Diagnosis not present

## 2016-08-19 DIAGNOSIS — N281 Cyst of kidney, acquired: Secondary | ICD-10-CM | POA: Diagnosis not present

## 2016-08-23 DIAGNOSIS — Z8546 Personal history of malignant neoplasm of prostate: Secondary | ICD-10-CM | POA: Diagnosis not present

## 2016-08-23 DIAGNOSIS — R310 Gross hematuria: Secondary | ICD-10-CM | POA: Diagnosis not present

## 2016-08-31 DIAGNOSIS — C61 Malignant neoplasm of prostate: Secondary | ICD-10-CM | POA: Diagnosis not present

## 2016-08-31 DIAGNOSIS — R531 Weakness: Secondary | ICD-10-CM | POA: Diagnosis not present

## 2016-09-07 DIAGNOSIS — M7051 Other bursitis of knee, right knee: Secondary | ICD-10-CM | POA: Diagnosis not present

## 2016-09-08 DIAGNOSIS — D2272 Melanocytic nevi of left lower limb, including hip: Secondary | ICD-10-CM | POA: Diagnosis not present

## 2016-09-08 DIAGNOSIS — D1801 Hemangioma of skin and subcutaneous tissue: Secondary | ICD-10-CM | POA: Diagnosis not present

## 2016-09-08 DIAGNOSIS — D225 Melanocytic nevi of trunk: Secondary | ICD-10-CM | POA: Diagnosis not present

## 2016-09-08 DIAGNOSIS — L821 Other seborrheic keratosis: Secondary | ICD-10-CM | POA: Diagnosis not present

## 2016-09-08 DIAGNOSIS — L812 Freckles: Secondary | ICD-10-CM | POA: Diagnosis not present

## 2016-09-08 DIAGNOSIS — D2271 Melanocytic nevi of right lower limb, including hip: Secondary | ICD-10-CM | POA: Diagnosis not present

## 2016-09-08 DIAGNOSIS — Z85820 Personal history of malignant melanoma of skin: Secondary | ICD-10-CM | POA: Diagnosis not present

## 2016-11-01 DIAGNOSIS — H524 Presbyopia: Secondary | ICD-10-CM | POA: Diagnosis not present

## 2016-11-01 DIAGNOSIS — H2513 Age-related nuclear cataract, bilateral: Secondary | ICD-10-CM | POA: Diagnosis not present

## 2016-11-01 DIAGNOSIS — H353132 Nonexudative age-related macular degeneration, bilateral, intermediate dry stage: Secondary | ICD-10-CM | POA: Diagnosis not present

## 2016-11-07 DIAGNOSIS — H903 Sensorineural hearing loss, bilateral: Secondary | ICD-10-CM | POA: Diagnosis not present

## 2016-11-23 DIAGNOSIS — H2511 Age-related nuclear cataract, right eye: Secondary | ICD-10-CM | POA: Diagnosis not present

## 2016-12-05 DIAGNOSIS — H2511 Age-related nuclear cataract, right eye: Secondary | ICD-10-CM | POA: Diagnosis not present

## 2016-12-05 DIAGNOSIS — H25811 Combined forms of age-related cataract, right eye: Secondary | ICD-10-CM | POA: Diagnosis not present

## 2016-12-13 DIAGNOSIS — H2512 Age-related nuclear cataract, left eye: Secondary | ICD-10-CM | POA: Diagnosis not present

## 2017-01-06 DIAGNOSIS — H25812 Combined forms of age-related cataract, left eye: Secondary | ICD-10-CM | POA: Diagnosis not present

## 2017-01-06 DIAGNOSIS — H2512 Age-related nuclear cataract, left eye: Secondary | ICD-10-CM | POA: Diagnosis not present

## 2017-02-14 DIAGNOSIS — Z8546 Personal history of malignant neoplasm of prostate: Secondary | ICD-10-CM | POA: Diagnosis not present

## 2017-02-21 DIAGNOSIS — Z8546 Personal history of malignant neoplasm of prostate: Secondary | ICD-10-CM | POA: Diagnosis not present

## 2017-02-21 DIAGNOSIS — M25562 Pain in left knee: Secondary | ICD-10-CM | POA: Diagnosis not present

## 2017-02-21 DIAGNOSIS — N5201 Erectile dysfunction due to arterial insufficiency: Secondary | ICD-10-CM | POA: Diagnosis not present

## 2017-03-09 DIAGNOSIS — L812 Freckles: Secondary | ICD-10-CM | POA: Diagnosis not present

## 2017-03-09 DIAGNOSIS — D225 Melanocytic nevi of trunk: Secondary | ICD-10-CM | POA: Diagnosis not present

## 2017-03-09 DIAGNOSIS — D2271 Melanocytic nevi of right lower limb, including hip: Secondary | ICD-10-CM | POA: Diagnosis not present

## 2017-03-09 DIAGNOSIS — D2272 Melanocytic nevi of left lower limb, including hip: Secondary | ICD-10-CM | POA: Diagnosis not present

## 2017-03-09 DIAGNOSIS — L821 Other seborrheic keratosis: Secondary | ICD-10-CM | POA: Diagnosis not present

## 2017-03-09 DIAGNOSIS — D1801 Hemangioma of skin and subcutaneous tissue: Secondary | ICD-10-CM | POA: Diagnosis not present

## 2017-03-09 DIAGNOSIS — Z85820 Personal history of malignant melanoma of skin: Secondary | ICD-10-CM | POA: Diagnosis not present

## 2017-03-13 DIAGNOSIS — M2392 Unspecified internal derangement of left knee: Secondary | ICD-10-CM | POA: Diagnosis not present

## 2017-04-24 DIAGNOSIS — M238X2 Other internal derangements of left knee: Secondary | ICD-10-CM | POA: Diagnosis not present

## 2017-04-24 DIAGNOSIS — M7052 Other bursitis of knee, left knee: Secondary | ICD-10-CM | POA: Diagnosis not present

## 2017-05-02 DIAGNOSIS — M2392 Unspecified internal derangement of left knee: Secondary | ICD-10-CM | POA: Diagnosis not present

## 2017-05-10 DIAGNOSIS — M2392 Unspecified internal derangement of left knee: Secondary | ICD-10-CM | POA: Diagnosis not present

## 2017-05-24 DIAGNOSIS — E7800 Pure hypercholesterolemia, unspecified: Secondary | ICD-10-CM | POA: Diagnosis not present

## 2017-05-24 DIAGNOSIS — Z0000 Encounter for general adult medical examination without abnormal findings: Secondary | ICD-10-CM | POA: Diagnosis not present

## 2017-05-24 DIAGNOSIS — Z79899 Other long term (current) drug therapy: Secondary | ICD-10-CM | POA: Diagnosis not present

## 2017-08-14 DIAGNOSIS — R310 Gross hematuria: Secondary | ICD-10-CM | POA: Diagnosis not present

## 2017-08-14 DIAGNOSIS — Z8546 Personal history of malignant neoplasm of prostate: Secondary | ICD-10-CM | POA: Diagnosis not present

## 2017-08-18 DIAGNOSIS — H43813 Vitreous degeneration, bilateral: Secondary | ICD-10-CM | POA: Diagnosis not present

## 2017-08-18 DIAGNOSIS — H3092 Unspecified chorioretinal inflammation, left eye: Secondary | ICD-10-CM | POA: Diagnosis not present

## 2017-08-18 DIAGNOSIS — H35711 Central serous chorioretinopathy, right eye: Secondary | ICD-10-CM | POA: Diagnosis not present

## 2017-08-18 DIAGNOSIS — H26491 Other secondary cataract, right eye: Secondary | ICD-10-CM | POA: Diagnosis not present

## 2017-08-18 DIAGNOSIS — H353132 Nonexudative age-related macular degeneration, bilateral, intermediate dry stage: Secondary | ICD-10-CM | POA: Diagnosis not present

## 2017-08-22 DIAGNOSIS — Z8546 Personal history of malignant neoplasm of prostate: Secondary | ICD-10-CM | POA: Diagnosis not present

## 2017-08-29 DIAGNOSIS — R3915 Urgency of urination: Secondary | ICD-10-CM | POA: Diagnosis not present

## 2017-08-29 DIAGNOSIS — R310 Gross hematuria: Secondary | ICD-10-CM | POA: Diagnosis not present

## 2017-08-29 DIAGNOSIS — Z8546 Personal history of malignant neoplasm of prostate: Secondary | ICD-10-CM | POA: Diagnosis not present

## 2017-08-31 DIAGNOSIS — D1801 Hemangioma of skin and subcutaneous tissue: Secondary | ICD-10-CM | POA: Diagnosis not present

## 2017-08-31 DIAGNOSIS — Z85820 Personal history of malignant melanoma of skin: Secondary | ICD-10-CM | POA: Diagnosis not present

## 2017-08-31 DIAGNOSIS — D2272 Melanocytic nevi of left lower limb, including hip: Secondary | ICD-10-CM | POA: Diagnosis not present

## 2017-08-31 DIAGNOSIS — L812 Freckles: Secondary | ICD-10-CM | POA: Diagnosis not present

## 2017-08-31 DIAGNOSIS — D225 Melanocytic nevi of trunk: Secondary | ICD-10-CM | POA: Diagnosis not present

## 2017-08-31 DIAGNOSIS — D2271 Melanocytic nevi of right lower limb, including hip: Secondary | ICD-10-CM | POA: Diagnosis not present

## 2017-08-31 DIAGNOSIS — L821 Other seborrheic keratosis: Secondary | ICD-10-CM | POA: Diagnosis not present

## 2017-09-27 DIAGNOSIS — H353132 Nonexudative age-related macular degeneration, bilateral, intermediate dry stage: Secondary | ICD-10-CM | POA: Diagnosis not present

## 2017-09-27 DIAGNOSIS — H43813 Vitreous degeneration, bilateral: Secondary | ICD-10-CM | POA: Diagnosis not present

## 2017-09-27 DIAGNOSIS — H3092 Unspecified chorioretinal inflammation, left eye: Secondary | ICD-10-CM | POA: Diagnosis not present

## 2017-10-10 DIAGNOSIS — M19011 Primary osteoarthritis, right shoulder: Secondary | ICD-10-CM | POA: Diagnosis not present

## 2017-11-07 ENCOUNTER — Other Ambulatory Visit: Payer: Self-pay | Admitting: Orthopaedic Surgery

## 2017-11-07 DIAGNOSIS — M19011 Primary osteoarthritis, right shoulder: Secondary | ICD-10-CM | POA: Diagnosis not present

## 2017-11-07 DIAGNOSIS — M25511 Pain in right shoulder: Secondary | ICD-10-CM

## 2017-11-08 ENCOUNTER — Encounter (HOSPITAL_COMMUNITY): Payer: Self-pay

## 2017-11-08 DIAGNOSIS — M19011 Primary osteoarthritis, right shoulder: Secondary | ICD-10-CM | POA: Diagnosis present

## 2017-11-08 NOTE — Pre-Procedure Instructions (Signed)
Aaron Hawkins  11/08/2017      [facility], [address] [place] [address] Phone: [phone] Fax: [phone]    Your procedure is scheduled on 11/22/2017.  Report to [facility] Admitting at [place].M.  Call this number if you have problems the morning of surgery:  [phone]   Remember:  Do not eat food or drink liquids after midnight.   Continue all medications as directed by your physician except follow these medication instructions before surgery below   Take these medicines the morning of surgery with A SIP OF WATER: Acetaminophen (Tylenol) - if needed Refresh eye drops - if needed  7 days prior to surgery STOP taking any Diclofenac sodium (Voltaren) gel, Aspirin (unless otherwise instructed by your surgeon), Aleve, Naproxen, Ibuprofen, Motrin, Advil, Goody's, BC's, all herbal medications, fish oil, and all vitamins    Do not wear jewelry.  Do not wear lotions, powders, or colognes, or deodorant.  Men may shave face and neck.  Do not bring valuables to the hospital.  [facility] is not responsible for any belongings or valuables.  Hearing aids, eyeglasses, contacts, dentures or bridgework may not be worn into surgery.  Leave your suitcase in the car.  After surgery it may be brought to your room.  For patients admitted to the hospital, discharge time will be determined by your treatment team.  Patients discharged the day of surgery will not be allowed to drive home.   Name and phone number of your driver:    Special instructions:   [place]- Preparing For Surgery  Before surgery, you can play an important role. Because skin is not sterile, your skin needs to be as free of germs as possible. You can reduce the number of germs on your skin by washing with CHG (chlorahexidine gluconate) Soap before surgery.  CHG is an antiseptic cleaner  which kills germs and bonds with the skin to continue killing germs even after washing.  Please do not use if you have an allergy to CHG or antibacterial soaps. If your skin becomes reddened/irritated stop using the CHG.  Do not shave (including legs and underarms) for at least 48 hours prior to first CHG shower. It is OK to shave your face.  Please follow these instructions carefully.   1. Shower the NIGHT BEFORE SURGERY and the MORNING OF SURGERY with CHG.   2. If you chose to wash your hair, wash your hair first as usual with your normal shampoo.  3. After you shampoo, rinse your hair and body thoroughly to remove the shampoo.  4. Use CHG as you would any other liquid soap. You can apply CHG directly to the skin and wash gently with a scrungie or a clean washcloth.   5. Apply the CHG Soap to your body ONLY FROM THE NECK DOWN.  Do not use on open wounds or open sores. Avoid contact with your eyes, ears, mouth and genitals (private parts). Wash Face and genitals (private parts)  with your normal soap.  6. Wash thoroughly, paying special attention to the area where your surgery will be performed.  7. Thoroughly rinse your body with warm water from the neck down.  8. DO NOT shower/wash with your normal soap after using and rinsing off the CHG Soap.  9. Pat yourself dry with a CLEAN TOWEL.  10. Wear CLEAN PAJAMAS to bed the  night before surgery, wear comfortable clothes the morning of surgery  11. Place CLEAN SHEETS on your bed the night of your first shower and DO NOT SLEEP WITH PETS.    Day of Surgery: Do not apply any deodorants/lotions. Please wear clean clothes to the hospital/surgery center.      Please read over the following fact sheets that you were given.

## 2017-11-08 NOTE — H&P (Signed)
PREOPERATIVE H&P  Chief Complaint: OA RIGHT SHOULDER  HPI: Aaron Hawkins is a 77 y.o. male who presents for preoperative history and physical with a diagnosis of OA RIGHT SHOULDER. Symptoms are rated as moderate to severe, and have been worsening.  This is significantly impairing activities of daily living.  He has elected for surgical management.   Past Medical History:  Diagnosis Date  . Arthritis   . COPD (chronic obstructive pulmonary disease) ([place])   . Enlarged prostate without urinary obstruction   . History of gastric ulcer   . History of Guillain-Barre syndrome 2003  . History of hydronephrosis    W/ STRICTURE  . History of melanoma excision    BACK-- 04-04-2007 W/ SLN BX RIGHT AXILL  . History of nephrolithiasis    right kidney  . Microscopic hematuria    history of  . Prostate cancer ([place]) 05/30/13   gleason 3+3=6, volume 58 cc   Past Surgical History:  Procedure Laterality Date  . HERNIA REPAIR  6578'I   w/mesh, umbilical, bilateral inguinal  . JOINT REPLACEMENT Right 2010  . MELANOMA EXCISION  04/04/2007   back W/ SLN BX RIGHT AXILL  . PROSTATE BIOPSY  01/02/13   benign, one small focus of atypical cells right core  . PROSTATE BIOPSY  05/30/13   Gleason 3+3=6, volume 49 cc  . RADIOACTIVE SEED IMPLANT N/A 08/08/2013   Procedure: RADIOACTIVE SEED IMPLANT;  Surgeon: Molli Hazard, MD;  [facility];  Service: Urology;  Laterality: N/A;   73 SEED IMPLANTED NO SEEDS FOUND IN BLADDER  . TONSILLECTOMY AND ADENOIDECTOMY  childhood  . TOTAL HIP ARTHROPLASTY Right 01-06-2009   Social History   Socioeconomic History  . Marital status: Married    Spouse name: Not on file  . Number of children: Not on file  . Years of education: Not on file  . Highest education level: Not on file  Occupational History  . Not on file  Social Needs  . Financial resource strain: Not on file  . Food insecurity:    Worry: Not on file    Inability: Not  on file  . Transportation needs:    Medical: Not on file    Non-medical: Not on file  Tobacco Use  . Smoking status: Former Smoker    Years: 5.00    Last attempt to quit: 06/18/1973    Years since quitting: 44.4  Substance and Sexual Activity  . Alcohol use: Yes    Alcohol/week: 0.6 oz    Types: 1 Glasses of wine per week    Comment: occasional  . Drug use: No  . Sexual activity: Not on file  Lifestyle  . Physical activity:    Days per week: Not on file    Minutes per session: Not on file  . Stress: Not on file  Relationships  . Social connections:    Talks on phone: Not on file    Gets together: Not on file    Attends religious service: Not on file    Active member of club or organization: Not on file    Attends meetings of clubs or organizations: Not on file    Relationship status: Not on file  Other Topics Concern  . Not on file  Social History Narrative  . Not on file   Family History  Problem Relation Age of Onset  . Cancer Father        prostate  . Cancer Brother  prostate   No Known Allergies Prior to Admission medications   Medication Sig Start Date End Date Taking? Authorizing Provider  acetaminophen (TYLENOL) 500 MG tablet Take 500 mg by mouth daily as needed for moderate pain or headache.   Yes [provider]  diclofenac sodium (VOLTAREN) 1 % GEL Apply 1 application topically daily as needed (joint pain).   Yes [provider]  Multiple Vitamins-Minerals (PRESERVISION AREDS 2) CAPS Take 1 capsule by mouth 2 (two) times daily.   Yes [provider]  Polyvinyl Alcohol-Povidone (REFRESH OP) Place 2 drops into both eyes daily as needed (dry eyes).   Yes [provider]  tamsulosin (FLOMAX) 0.4 MG CAPS capsule Take 2 capsules (0.8 mg total) by mouth daily. Patient not taking: Reported on 11/07/2017 08/08/13   Rolan Bucco, MD     Positive ROS: All other systems have been reviewed and were otherwise negative with  the exception of those mentioned in the HPI and as above.  Physical Exam: General: Alert, no acute distress Cardiovascular: No pedal edema Respiratory: No cyanosis, no use of accessory musculature GI: No organomegaly, abdomen is soft and non-tender Skin: No lesions in the area of chief complaint Neurologic: Sensation intact distally Psychiatric: Patient is competent for consent with normal mood and affect Lymphatic: No axillary or cervical lymphadenopathy  MUSCULOSKELETAL: R shoulder 90 deg FE, crepitance, axillary nerve firing  Assessment: OA RIGHT SHOULDER  Plan: Plan for Procedure(s): TOTAL SHOULDER ARTHROPLASTY  The risks benefits and alternatives were discussed with the patient including but not limited to the risks of nonoperative treatment, versus surgical intervention including infection, bleeding, nerve injury,  blood clots, cardiopulmonary complications, morbidity, mortality, among others, and they were willing to proceed.   Hiram Gash, MD  11/08/2017 12:44 PM

## 2017-11-09 ENCOUNTER — Encounter (HOSPITAL_COMMUNITY): Payer: Self-pay

## 2017-11-09 ENCOUNTER — Other Ambulatory Visit: Payer: Self-pay

## 2017-11-09 ENCOUNTER — Encounter (HOSPITAL_COMMUNITY)
Admission: RE | Admit: 2017-11-09 | Discharge: 2017-11-09 | Disposition: A | Discharge status: Home / Self Care | Payer: Medicare Other | Source: Ambulatory Visit | Attending: Orthopaedic Surgery | Admitting: Orthopaedic Surgery

## 2017-11-09 DIAGNOSIS — I4510 Unspecified right bundle-branch block: Secondary | ICD-10-CM | POA: Diagnosis not present

## 2017-11-09 DIAGNOSIS — Z01818 Encounter for other preprocedural examination: Secondary | ICD-10-CM | POA: Insufficient documentation

## 2017-11-09 DIAGNOSIS — R9431 Abnormal electrocardiogram [ECG] [EKG]: Secondary | ICD-10-CM | POA: Insufficient documentation

## 2017-11-09 DIAGNOSIS — Z01812 Encounter for preprocedural laboratory examination: Secondary | ICD-10-CM | POA: Diagnosis not present

## 2017-11-09 LAB — CBC
HCT: 50.3 % (ref 39.0–52.0)
Hemoglobin: 16.7 g/dL (ref 13.0–17.0)
MCH: 30.3 pg (ref 26.0–34.0)
MCHC: 33.2 g/dL (ref 30.0–36.0)
MCV: 91.3 fL (ref 78.0–100.0)
Platelets: 148 10*3/uL — ABNORMAL LOW (ref 150–400)
RBC: 5.51 MIL/uL (ref 4.22–5.81)
RDW: 13.7 % (ref 11.5–15.5)
WBC: 6.3 10*3/uL (ref 4.0–10.5)

## 2017-11-09 LAB — SURGICAL PCR SCREEN
MRSA, PCR: NEGATIVE
Staphylococcus aureus: NEGATIVE

## 2017-11-09 LAB — BASIC METABOLIC PANEL
Anion gap: 9 (ref 5–15)
BUN: 21 mg/dL — ABNORMAL HIGH (ref 6–20)
CO2: 26 mmol/L (ref 22–32)
Calcium: 9 mg/dL (ref 8.9–10.3)
Chloride: 105 mmol/L (ref 101–111)
Creatinine, Ser: 1 mg/dL (ref 0.61–1.24)
GFR calc Af Amer: >60 mL/min (ref >60)
GFR calc non Af Amer: >60 mL/min (ref >60)
Glucose, Bld: 97 mg/dL (ref 65–99)
Potassium: 4.4 mmol/L (ref 3.5–5.1)
Sodium: 140 mmol/L (ref 135–145)

## 2017-11-09 NOTE — Progress Notes (Addendum)
PCP - Lawerance Cruel Cardiologist - denies  Chest x-ray - not needed EKG - 11/09/17 due to elevated blood pressure at PAT Stress Test - denies ECHO - denies Cardiac Cath - denies   Anesthesia review: yes elevated blood pressure - has an appointment with PCP tomorrow  Faxing over a copy of the EKG to patients PCP per anesthesia request and instructed patient to call us with any medication changes or additions   Patient denies shortness of breath, fever, cough and chest pain at PAT appointment   Patient verbalized understanding of instructions that were given to them at the PAT appointment. Patient was also instructed that they will need to review over the PAT instructions again at home before surgery.

## 2017-11-10 DIAGNOSIS — C61 Malignant neoplasm of prostate: Secondary | ICD-10-CM | POA: Diagnosis not present

## 2017-11-10 DIAGNOSIS — R030 Elevated blood-pressure reading, without diagnosis of hypertension: Secondary | ICD-10-CM | POA: Diagnosis not present

## 2017-11-10 DIAGNOSIS — M25519 Pain in unspecified shoulder: Secondary | ICD-10-CM | POA: Diagnosis not present

## 2017-11-10 DIAGNOSIS — M1990 Unspecified osteoarthritis, unspecified site: Secondary | ICD-10-CM | POA: Diagnosis not present

## 2017-11-13 ENCOUNTER — Ambulatory Visit
Admission: RE | Admit: 2017-11-13 | Discharge: 2017-11-13 | Disposition: A | Discharge status: Home / Self Care | Payer: Medicare Other | Source: Ambulatory Visit | Attending: Orthopaedic Surgery | Admitting: Orthopaedic Surgery

## 2017-11-13 DIAGNOSIS — M19011 Primary osteoarthritis, right shoulder: Secondary | ICD-10-CM | POA: Diagnosis not present

## 2017-11-13 DIAGNOSIS — M25511 Pain in right shoulder: Secondary | ICD-10-CM

## 2017-11-13 NOTE — Progress Notes (Signed)
Anesthesia Chart Review:   Case:  [id number] Date/Time:  11/22/17 0815   Procedure:  TOTAL SHOULDER ARTHROPLASTY (Right )   Anesthesia type:  Choice   Pre-op diagnosis:  OA RIGHT SHOULDER   Location:  [facility] OR ROOM 03 / [place] OR   Surgeon:  Hiram Gash, MD     Addendum 11/16/17:   Pt saw PCP 11/10/17.  BP 138/98 at that visit. Low salt, increase potassium, and BP monitoring recommended.  Anti-HTN not initiated.  PCP is aware of upcoming surgery.     DISCUSSION: Pt is a 77 year old male. BP elevated at pre-admission testing.  Pt to discuss this with PCP at office visit 11/10/17. Awaiting records.   VS: BP (!) 156/97   Pulse 67   Temp 36.8 C   Resp 20   Ht 5\' 8"  (1.727 m)   Wt 179 lb 12.8 oz (81.6 kg)   SpO2 97%   BMI 27.34 kg/m   PROVIDERS: PCP is Lawerance Cruel, MD   LABS: Labs reviewed: Acceptable for surgery. (all labs ordered are listed, but only abnormal results are displayed)  Labs Reviewed  BASIC METABOLIC PANEL - Abnormal; Notable for the following components:      Result Value   BUN 21 (*)    All other components within normal limits  CBC - Abnormal; Notable for the following components:   Platelets 148 (*)    All other components within normal limits  SURGICAL PCR SCREEN    EKG 11/09/17: NSR. LAD. RBBB    Past Medical History:  Diagnosis Date  . Arthritis   . COPD (chronic obstructive pulmonary disease) ([place])    denies  . Enlarged prostate without urinary obstruction   . History of gastric ulcer    1964  . History of Guillain-Barre syndrome 2003  . History of hydronephrosis    W/ STRICTURE - not aware of this  . History of melanoma excision    BACK-- 04-04-2007 W/ SLN BX RIGHT AXILL, right arm 2017  . History of nephrolithiasis    right kidney  . Microscopic hematuria    history of  . Prostate cancer ([place]) 05/30/13   gleason 3+3=6, volume 58 cc    Past Surgical History:  Procedure Laterality Date  . HERNIA REPAIR  1093'A   w/mesh, umbilical,  bilateral inguinal  . JOINT REPLACEMENT Right 2010  . MELANOMA EXCISION  04/04/2007   back W/ SLN BX RIGHT AXILL  . PROSTATE BIOPSY  01/02/13   benign, one small focus of atypical cells right core  . PROSTATE BIOPSY  05/30/13   Gleason 3+3=6, volume 49 cc  . RADIOACTIVE SEED IMPLANT N/A 08/08/2013   Procedure: RADIOACTIVE SEED IMPLANT;  Surgeon: Molli Hazard, MD;  [facility];  Service: Urology;  Laterality: N/A;   61 SEED IMPLANTED NO SEEDS FOUND IN BLADDER  . TONSILLECTOMY AND ADENOIDECTOMY  childhood  . TOTAL HIP ARTHROPLASTY Right 01-06-2009    MEDICATIONS: . acetaminophen (TYLENOL) 500 MG tablet  . diclofenac sodium (VOLTAREN) 1 % GEL  . Multiple Vitamins-Minerals (PRESERVISION AREDS 2) CAPS  . Polyvinyl Alcohol-Povidone (REFRESH OP)  . tamsulosin (FLOMAX) 0.4 MG CAPS capsule   No current facility-administered medications for this encounter.     If BP acceptable day of surgery, I anticipate pt can proceed with surgery as scheduled.  Willeen Cass, FNP-BC [facility] Short Stay Surgical [facility]/Anesthesiology Phone: [phone] 11/16/2017 2:50 PM

## 2017-11-21 MED ORDER — TRANEXAMIC ACID 1000 MG/10ML IV SOLN
1000.0000 mg | INTRAVENOUS | Status: AC
  Administered 2017-11-22: 1000 mg via INTRAVENOUS
  Filled 2017-11-21: qty 1100

## 2017-11-21 MED ORDER — TRANEXAMIC ACID 1000 MG/10ML IV SOLN
1000.0000 mg | INTRAVENOUS | Status: DC
  Filled 2017-11-21: qty 10

## 2017-11-22 ENCOUNTER — Inpatient Hospital Stay (HOSPITAL_COMMUNITY): Payer: Medicare Other

## 2017-11-22 ENCOUNTER — Inpatient Hospital Stay (HOSPITAL_COMMUNITY)
Admission: RE | Admit: 2017-11-22 | Discharge: 2017-11-23 | DRG: 483 | Disposition: A | Discharge status: Home / Self Care | Payer: Medicare Other | Source: Ambulatory Visit | Attending: Orthopaedic Surgery | Admitting: Orthopaedic Surgery

## 2017-11-22 ENCOUNTER — Inpatient Hospital Stay (HOSPITAL_COMMUNITY): Payer: Medicare Other | Admitting: Vascular Surgery

## 2017-11-22 ENCOUNTER — Other Ambulatory Visit: Payer: Self-pay

## 2017-11-22 ENCOUNTER — Encounter (HOSPITAL_COMMUNITY): Payer: Self-pay | Admitting: Certified Registered Nurse Anesthetist

## 2017-11-22 ENCOUNTER — Encounter (HOSPITAL_COMMUNITY)
Admission: RE | Disposition: A | Discharge status: Home / Self Care | Payer: Self-pay | Source: Ambulatory Visit | Attending: Orthopaedic Surgery

## 2017-11-22 ENCOUNTER — Inpatient Hospital Stay (HOSPITAL_COMMUNITY): Payer: Medicare Other | Admitting: Certified Registered Nurse Anesthetist

## 2017-11-22 DIAGNOSIS — Z96641 Presence of right artificial hip joint: Secondary | ICD-10-CM | POA: Diagnosis present

## 2017-11-22 DIAGNOSIS — M19011 Primary osteoarthritis, right shoulder: Secondary | ICD-10-CM | POA: Diagnosis present

## 2017-11-22 DIAGNOSIS — Z923 Personal history of irradiation: Secondary | ICD-10-CM

## 2017-11-22 DIAGNOSIS — Z85820 Personal history of malignant melanoma of skin: Secondary | ICD-10-CM | POA: Diagnosis not present

## 2017-11-22 DIAGNOSIS — J449 Chronic obstructive pulmonary disease, unspecified: Secondary | ICD-10-CM | POA: Diagnosis present

## 2017-11-22 DIAGNOSIS — Z8546 Personal history of malignant neoplasm of prostate: Secondary | ICD-10-CM | POA: Diagnosis not present

## 2017-11-22 DIAGNOSIS — Z87891 Personal history of nicotine dependence: Secondary | ICD-10-CM | POA: Diagnosis not present

## 2017-11-22 DIAGNOSIS — N400 Enlarged prostate without lower urinary tract symptoms: Secondary | ICD-10-CM | POA: Diagnosis not present

## 2017-11-22 DIAGNOSIS — Z87442 Personal history of urinary calculi: Secondary | ICD-10-CM

## 2017-11-22 DIAGNOSIS — Z8711 Personal history of peptic ulcer disease: Secondary | ICD-10-CM

## 2017-11-22 DIAGNOSIS — M659 Synovitis and tenosynovitis, unspecified: Secondary | ICD-10-CM | POA: Diagnosis present

## 2017-11-22 DIAGNOSIS — Z96661 Presence of right artificial ankle joint: Secondary | ICD-10-CM | POA: Diagnosis not present

## 2017-11-22 DIAGNOSIS — Z471 Aftercare following joint replacement surgery: Secondary | ICD-10-CM | POA: Diagnosis not present

## 2017-11-22 DIAGNOSIS — Z8042 Family history of malignant neoplasm of prostate: Secondary | ICD-10-CM

## 2017-11-22 DIAGNOSIS — Z09 Encounter for follow-up examination after completed treatment for conditions other than malignant neoplasm: Secondary | ICD-10-CM

## 2017-11-22 DIAGNOSIS — G8918 Other acute postprocedural pain: Secondary | ICD-10-CM | POA: Diagnosis not present

## 2017-11-22 HISTORY — DX: Nausea with vomiting, unspecified: R11.2

## 2017-11-22 HISTORY — DX: Other specified postprocedural states: Z98.890

## 2017-11-22 HISTORY — DX: Duodenal ulcer, unspecified as acute or chronic, without hemorrhage or perforation: K26.9

## 2017-11-22 HISTORY — DX: Personal history of urinary calculi: Z87.442

## 2017-11-22 HISTORY — PX: TOTAL SHOULDER ARTHROPLASTY: SHX126

## 2017-11-22 SURGERY — ARTHROPLASTY, SHOULDER, TOTAL
Anesthesia: Regional | Laterality: Right

## 2017-11-22 MED ORDER — ONDANSETRON HCL 4 MG/2ML IJ SOLN
INTRAMUSCULAR | Status: DC | PRN
  Administered 2017-11-22: 4 mg via INTRAVENOUS

## 2017-11-22 MED ORDER — LIDOCAINE 2% (20 MG/ML) 5 ML SYRINGE
INTRAMUSCULAR | Status: DC | PRN
  Administered 2017-11-22: 60 mg via INTRAVENOUS

## 2017-11-22 MED ORDER — SUGAMMADEX SODIUM 200 MG/2ML IV SOLN
INTRAVENOUS | Status: DC | PRN
Start: 2017-11-22 — End: 2017-11-22
  Administered 2017-11-22: 200 mg via INTRAVENOUS

## 2017-11-22 MED ORDER — ONDANSETRON HCL 4 MG/2ML IJ SOLN
INTRAMUSCULAR | Status: AC
  Filled 2017-11-22: qty 2

## 2017-11-22 MED ORDER — FENTANYL CITRATE (PF) 100 MCG/2ML IJ SOLN: 25.0000 ug | INTRAMUSCULAR | Status: DC | PRN

## 2017-11-22 MED ORDER — SODIUM CHLORIDE 0.9 % IV SOLN
INTRAVENOUS | Status: DC | PRN
  Administered 2017-11-22: 25 ug/min via INTRAVENOUS

## 2017-11-22 MED ORDER — PROPOFOL 10 MG/ML IV BOLUS
INTRAVENOUS | Status: AC
  Filled 2017-11-22: qty 20

## 2017-11-22 MED ORDER — ONDANSETRON HCL 4 MG/2ML IJ SOLN: 4.0000 mg | Freq: Four times a day (QID) | INTRAMUSCULAR | Status: DC | PRN

## 2017-11-22 MED ORDER — FENTANYL CITRATE (PF) 100 MCG/2ML IJ SOLN
INTRAMUSCULAR | Status: DC | PRN
  Administered 2017-11-22 (×3): 50 ug via INTRAVENOUS

## 2017-11-22 MED ORDER — METOCLOPRAMIDE HCL 5 MG PO TABS: 5.0000 mg | ORAL_TABLET | Freq: Three times a day (TID) | ORAL | Status: DC | PRN

## 2017-11-22 MED ORDER — CEFAZOLIN SODIUM-DEXTROSE 2-4 GM/100ML-% IV SOLN
2.0000 g | INTRAVENOUS | Status: AC
  Administered 2017-11-22: 2 g via INTRAVENOUS
  Filled 2017-11-22: qty 100

## 2017-11-22 MED ORDER — EPHEDRINE SULFATE-NACL 50-0.9 MG/10ML-% IV SOSY
PREFILLED_SYRINGE | INTRAVENOUS | Status: DC | PRN
  Administered 2017-11-22 (×4): 10 mg via INTRAVENOUS

## 2017-11-22 MED ORDER — OXYCODONE HCL 5 MG/5ML PO SOLN: 5.0000 mg | Freq: Once | ORAL | Status: DC | PRN

## 2017-11-22 MED ORDER — ACETAMINOPHEN 500 MG PO TABS
1000.0000 mg | ORAL_TABLET | Freq: Four times a day (QID) | ORAL | Status: AC
  Administered 2017-11-22 – 2017-11-23 (×4): 1000 mg via ORAL
  Filled 2017-11-22 (×5): qty 2

## 2017-11-22 MED ORDER — 0.9 % SODIUM CHLORIDE (POUR BTL) OPTIME
TOPICAL | Status: DC | PRN
  Administered 2017-11-22: 1000 mL

## 2017-11-22 MED ORDER — ROCURONIUM BROMIDE 50 MG/5ML IV SOLN
INTRAVENOUS | Status: AC
  Filled 2017-11-22: qty 1

## 2017-11-22 MED ORDER — ZOLPIDEM TARTRATE 5 MG PO TABS: 5.0000 mg | ORAL_TABLET | Freq: Every evening | ORAL | Status: DC | PRN

## 2017-11-22 MED ORDER — METOCLOPRAMIDE HCL 5 MG/ML IJ SOLN: 5.0000 mg | Freq: Three times a day (TID) | INTRAMUSCULAR | Status: DC | PRN

## 2017-11-22 MED ORDER — DEXAMETHASONE SODIUM PHOSPHATE 10 MG/ML IJ SOLN
INTRAMUSCULAR | Status: AC
  Filled 2017-11-22: qty 1

## 2017-11-22 MED ORDER — OXYCODONE HCL 5 MG PO TABS
5.0000 mg | ORAL_TABLET | ORAL | Status: DC | PRN
  Administered 2017-11-23: 10 mg via ORAL
  Filled 2017-11-22: qty 2

## 2017-11-22 MED ORDER — CEFAZOLIN SODIUM-DEXTROSE 1-4 GM/50ML-% IV SOLN
1.0000 g | Freq: Four times a day (QID) | INTRAVENOUS | Status: AC
  Administered 2017-11-22 – 2017-11-23 (×3): 1 g via INTRAVENOUS
  Filled 2017-11-22 (×3): qty 50

## 2017-11-22 MED ORDER — LACTATED RINGERS IV SOLN
INTRAVENOUS | Status: DC | PRN
  Administered 2017-11-22: 08:00:00 via INTRAVENOUS

## 2017-11-22 MED ORDER — CHLORHEXIDINE GLUCONATE 4 % EX LIQD: 60.0000 mL | Freq: Once | CUTANEOUS | Status: DC

## 2017-11-22 MED ORDER — DIPHENHYDRAMINE HCL 12.5 MG/5ML PO ELIX: 12.5000 mg | ORAL_SOLUTION | ORAL | Status: DC | PRN

## 2017-11-22 MED ORDER — MIDAZOLAM HCL 2 MG/2ML IJ SOLN
INTRAMUSCULAR | Status: AC
  Filled 2017-11-22: qty 2

## 2017-11-22 MED ORDER — ROCURONIUM BROMIDE 50 MG/5ML IV SOSY
PREFILLED_SYRINGE | INTRAVENOUS | Status: DC | PRN
  Administered 2017-11-22: 5 mg via INTRAVENOUS
  Administered 2017-11-22: 20 mg via INTRAVENOUS
  Administered 2017-11-22: 10 mg via INTRAVENOUS
  Administered 2017-11-22: 50 mg via INTRAVENOUS

## 2017-11-22 MED ORDER — SUCCINYLCHOLINE CHLORIDE 200 MG/10ML IV SOSY
PREFILLED_SYRINGE | INTRAVENOUS | Status: AC
  Filled 2017-11-22: qty 10

## 2017-11-22 MED ORDER — SUGAMMADEX SODIUM 200 MG/2ML IV SOLN
INTRAVENOUS | Status: AC
  Filled 2017-11-22: qty 2

## 2017-11-22 MED ORDER — FENTANYL CITRATE (PF) 250 MCG/5ML IJ SOLN
INTRAMUSCULAR | Status: AC
  Filled 2017-11-22: qty 5

## 2017-11-22 MED ORDER — HYDROMORPHONE HCL 2 MG/ML IJ SOLN: 0.5000 mg | INTRAMUSCULAR | Status: DC | PRN

## 2017-11-22 MED ORDER — MIDAZOLAM HCL 5 MG/5ML IJ SOLN
INTRAMUSCULAR | Status: DC | PRN
  Administered 2017-11-22: 1 mg via INTRAVENOUS

## 2017-11-22 MED ORDER — DOCUSATE SODIUM 100 MG PO CAPS
100.0000 mg | ORAL_CAPSULE | Freq: Two times a day (BID) | ORAL | Status: DC
  Administered 2017-11-22 – 2017-11-23 (×2): 100 mg via ORAL
  Filled 2017-11-22 (×2): qty 1

## 2017-11-22 MED ORDER — OXYCODONE HCL 5 MG PO TABS: 5.0000 mg | ORAL_TABLET | Freq: Once | ORAL | Status: DC | PRN

## 2017-11-22 MED ORDER — ONDANSETRON HCL 4 MG PO TABS: 4.0000 mg | ORAL_TABLET | Freq: Four times a day (QID) | ORAL | Status: DC | PRN

## 2017-11-22 MED ORDER — LIDOCAINE 2% (20 MG/ML) 5 ML SYRINGE
INTRAMUSCULAR | Status: AC
  Filled 2017-11-22: qty 5

## 2017-11-22 MED ORDER — CELECOXIB 200 MG PO CAPS
200.0000 mg | ORAL_CAPSULE | Freq: Two times a day (BID) | ORAL | Status: DC
  Administered 2017-11-22 – 2017-11-23 (×3): 200 mg via ORAL
  Filled 2017-11-22 (×3): qty 1

## 2017-11-22 MED ORDER — PROPOFOL 10 MG/ML IV BOLUS
INTRAVENOUS | Status: DC | PRN
  Administered 2017-11-22: 200 mg via INTRAVENOUS

## 2017-11-22 MED ORDER — SODIUM CHLORIDE 0.9 % IR SOLN
Status: DC | PRN
  Administered 2017-11-22: 3000 mL

## 2017-11-22 MED ORDER — DEXAMETHASONE SODIUM PHOSPHATE 10 MG/ML IJ SOLN
INTRAMUSCULAR | Status: DC | PRN
  Administered 2017-11-22: 10 mg via INTRAVENOUS

## 2017-11-22 MED ORDER — HYDROMORPHONE HCL 1 MG/ML IJ SOLN: 0.5000 mg | INTRAMUSCULAR | Status: DC | PRN

## 2017-11-22 MED ORDER — BUPIVACAINE-EPINEPHRINE (PF) 0.5% -1:200000 IJ SOLN
INTRAMUSCULAR | Status: DC | PRN
  Administered 2017-11-22: 30 mL via PERINEURAL

## 2017-11-22 SURGICAL SUPPLY — 71 items
AID PSTN UNV HD RSTRNT DISP (MISCELLANEOUS) ×1
APL SKNCLS STERI-STRIP NONHPOA (GAUZE/BANDAGES/DRESSINGS) ×1
BENZOIN TINCTURE PRP APPL 2/3 (GAUZE/BANDAGES/DRESSINGS) ×2 IMPLANT
BLADE SAW SAG 29X58X.64 (BLADE) ×1 IMPLANT
BLADE SAW SAG 73X25 THK (BLADE)
BLADE SAW SGTL 73X25 THK (BLADE) IMPLANT
CEMENT BONE DEPUY (Cement) ×2 IMPLANT
CHLORAPREP W/TINT 26ML (MISCELLANEOUS) ×4 IMPLANT
COVER SURGICAL LIGHT HANDLE (MISCELLANEOUS) ×2 IMPLANT
DRAPE C-ARM 42X72 X-RAY (DRAPES) IMPLANT
DRAPE INCISE IOBAN 66X45 STRL (DRAPES) ×4 IMPLANT
DRAPE ORTHO SPLIT 77X108 STRL (DRAPES) ×4
DRAPE PROXIMA HALF (DRAPES) ×2 IMPLANT
DRAPE SURG ORHT 6 SPLT 77X108 (DRAPES) ×2 IMPLANT
DRAPE SWITCH (DRAPES) ×2 IMPLANT
DRSG AQUACEL AG ADV 3.5X 6 (GAUZE/BANDAGES/DRESSINGS) ×2 IMPLANT
ELECT BLADE 4.0 EZ CLEAN MEGAD (MISCELLANEOUS) ×2
ELECT CAUTERY BLADE 6.4 (BLADE) ×2 IMPLANT
ELECT REM PT RETURN 9FT ADLT (ELECTROSURGICAL) ×2
ELECTRODE BLDE 4.0 EZ CLN MEGD (MISCELLANEOUS) ×1 IMPLANT
ELECTRODE REM PT RTRN 9FT ADLT (ELECTROSURGICAL) ×1 IMPLANT
GLENOID PEGGED CORTILOC M40 (Orthopedic Implant) IMPLANT
GLOVE BIOGEL PI IND STRL 8 (GLOVE) ×1 IMPLANT
GLOVE BIOGEL PI INDICATOR 8 (GLOVE) ×2
GLOVE BIOGEL PI ORTHO PRO SZ8 (GLOVE) ×1
GLOVE ECLIPSE 8.0 STRL XLNG CF (GLOVE) ×4 IMPLANT
GLOVE PI ORTHO PRO STRL SZ8 (GLOVE) ×1 IMPLANT
GOWN STRL REUS W/ TWL LRG LVL3 (GOWN DISPOSABLE) ×1 IMPLANT
GOWN STRL REUS W/ TWL XL LVL3 (GOWN DISPOSABLE) ×1 IMPLANT
GOWN STRL REUS W/TWL 2XL LVL3 (GOWN DISPOSABLE) ×2 IMPLANT
GOWN STRL REUS W/TWL LRG LVL3 (GOWN DISPOSABLE) ×2
GOWN STRL REUS W/TWL XL LVL3 (GOWN DISPOSABLE) ×2
GUIDEWIRE GLENOID 2.5X220 (WIRE) ×1 IMPLANT
HANDPIECE INTERPULSE COAX TIP (DISPOSABLE) ×2
HEAD HUMERAL AEQUALIS 48X18 (Head) ×2 IMPLANT
HEAD HUMERAL HIGH OS 48X18 (Head) ×1 IMPLANT
HUMERAL STEM AEQUALIS 3BX74MM (Stem) ×2 IMPLANT
KIT BASIN OR (CUSTOM PROCEDURE TRAY) ×2 IMPLANT
KIT STABILIZATION SHOULDER (MISCELLANEOUS) ×2 IMPLANT
KIT TURNOVER KIT B (KITS) ×2 IMPLANT
MANIFOLD NEPTUNE II (INSTRUMENTS) ×2 IMPLANT
NDL MAYO TROCAR (NEEDLE) ×1 IMPLANT
NEEDLE HYPO 25GX1X1/2 BEV (NEEDLE) IMPLANT
NEEDLE MAYO TROCAR (NEEDLE) ×2 IMPLANT
NS IRRIG 1000ML POUR BTL (IV SOLUTION) ×2 IMPLANT
PACK SHOULDER (CUSTOM PROCEDURE TRAY) ×2 IMPLANT
PAD ARMBOARD 7.5X6 YLW CONV (MISCELLANEOUS) ×4 IMPLANT
PEGGED GLENOID CORTILOC (Orthopedic Implant) ×1 IMPLANT
RESTRAINT HEAD UNIVERSAL NS (MISCELLANEOUS) ×2 IMPLANT
SET HNDPC FAN SPRY TIP SCT (DISPOSABLE) IMPLANT
SMARTMIX MINI TOWER (MISCELLANEOUS) ×2
SPONGE LAP 18X18 X RAY DECT (DISPOSABLE) ×2 IMPLANT
STEM HUMERAL AEQUALIS 3BX74MM (Stem) IMPLANT
STRIP CLOSURE SKIN 1/2X4 (GAUZE/BANDAGES/DRESSINGS) ×2 IMPLANT
SUCTION FRAZIER HANDLE 10FR (MISCELLANEOUS) ×1
SUCTION TUBE FRAZIER 10FR DISP (MISCELLANEOUS) ×1 IMPLANT
SUT ETHIBOND 2 V 37 (SUTURE) ×2 IMPLANT
SUT ETHIBOND NAB CT1 #1 30IN (SUTURE) ×2 IMPLANT
SUT FIBERWIRE #5 38 CONV NDL (SUTURE) ×12
SUT MON AB 3-0 SH 27 (SUTURE) ×2
SUT MON AB 3-0 SH27 (SUTURE) ×1 IMPLANT
SUT VIC AB 0 CT1 18XCR BRD 8 (SUTURE) ×1 IMPLANT
SUT VIC AB 0 CT1 8-18 (SUTURE) ×2
SUT VIC AB 2-0 CT1 27 (SUTURE) ×2
SUT VIC AB 2-0 CT1 TAPERPNT 27 (SUTURE) ×1 IMPLANT
SUTURE FIBERWR #5 38 CONV NDL (SUTURE) ×6 IMPLANT
TOWEL OR 17X26 10 PK STRL BLUE (TOWEL DISPOSABLE) ×2 IMPLANT
TOWER CARTRIDGE SMART MIX (DISPOSABLE) IMPLANT
TOWER SMARTMIX MINI (MISCELLANEOUS) IMPLANT
TRAY FOLEY W/BAG SLVR 14FR (SET/KITS/TRAYS/PACK) IMPLANT
WATER STERILE IRR 1000ML POUR (IV SOLUTION) ×2 IMPLANT

## 2017-11-22 NOTE — Interval H&P Note (Signed)
Discussed case, risks and benefits with patient again.  All questions answered, no change to history.  We additionally specifically discussed risks of axillary nerve injury, infection, periprosthetic fracture, continued pain and longevity of implants prior to beginning procedure.

## 2017-11-22 NOTE — Progress Notes (Signed)
Patient followed up with PCP about blood pressure.  Patient stated that his PCP did not want to start him on any pressure medications yet before surgery but to change his diet intake of salt.  Patient reported lower blood pressures at home with an automatic blood pressure cuff.

## 2017-11-22 NOTE — Progress Notes (Signed)
Orthopedic Tech Progress Note Patient Details:  Aaron Hawkins May 03, 1941 [id number]  Ortho Devices Type of Ortho Device: Shoulder abduction pillow Ortho Device/Splint Interventions: Ordered  as ordered by Dr. Ferdinand Lango, Aaron Hawkins 11/22/2017, 9:32 AM

## 2017-11-22 NOTE — Anesthesia Preprocedure Evaluation (Signed)
Anesthesia Evaluation  Patient identified by MRN, date of birth, ID band Patient awake    Reviewed: Allergy & Precautions, H&P , NPO status , Patient's Chart, lab work & pertinent test results  Airway Mallampati: II   Neck ROM: full    Dental   Pulmonary COPD, former smoker,    breath sounds clear to auscultation       Cardiovascular negative cardio ROS   Rhythm:regular Rate:Normal     Neuro/Psych    GI/Hepatic   Endo/Other    Renal/GU      Musculoskeletal  (+) Arthritis ,   Abdominal   Peds  Hematology   Anesthesia Other Findings   Reproductive/Obstetrics                             Anesthesia Physical Anesthesia Plan  ASA: II  Anesthesia Plan: General and Regional   Post-op Pain Management:  Regional for Post-op pain   Induction: Intravenous  PONV Risk Score and Plan: 2 and Ondansetron, Dexamethasone and Treatment may vary due to age or medical condition  Airway Management Planned: Oral ETT  Additional Equipment:   Intra-op Plan:   Post-operative Plan: Extubation in OR  Informed Consent: I have reviewed the patients History and Physical, chart, labs and discussed the procedure including the risks, benefits and alternatives for the proposed anesthesia with the patient or authorized representative who has indicated his/her understanding and acceptance.     Plan Discussed with: CRNA, Anesthesiologist and Surgeon  Anesthesia Plan Comments:         Anesthesia Quick Evaluation

## 2017-11-22 NOTE — Anesthesia Procedure Notes (Signed)
Procedure Name: Intubation Date/Time: 11/22/2017 8:44 AM Performed by: Maxwell Caul, CRNA Pre-anesthesia Checklist: Patient identified, Emergency Drugs available, Suction available and Patient being monitored Patient Re-evaluated:Patient Re-evaluated prior to induction Oxygen Delivery Method: Circle system utilized Preoxygenation: Pre-oxygenation with 100% oxygen Induction Type: IV induction Ventilation: Mask ventilation without difficulty Laryngoscope Size: Mac and 3 Grade View: Grade I Tube type: Oral Tube size: 7.5 mm Number of attempts: 1 Airway Equipment and Method: Stylet Placement Confirmation: ETT inserted through vocal cords under direct vision,  positive ETCO2 and breath sounds checked- equal and bilateral Secured at: 21 cm Tube secured with: Tape Dental Injury: Teeth and Oropharynx as per pre-operative assessment

## 2017-11-22 NOTE — Transfer of Care (Signed)
Immediate Anesthesia Transfer of Care Note  Patient: Aaron Hawkins  Procedure(s) Performed: TOTAL SHOULDER ARTHROPLASTY (Right )  Patient Location: PACU  Anesthesia Type:General  Level of Consciousness: awake, alert  and oriented  Airway & Oxygen Therapy: Patient Spontanous Breathing and Patient connected to face mask oxygen  Post-op Assessment: Report given to RN and Post -op Vital signs reviewed and stable  Post vital signs: Reviewed and stable  Last Vitals:  Vitals Value Taken Time  BP    Temp    Pulse    Resp    SpO2      Last Pain:  Vitals:   11/22/17 0709  TempSrc:   PainSc: 0-No pain      Patients Stated Pain Goal: 2 (84/12/82 0813)  Complications: No apparent anesthesia complications

## 2017-11-22 NOTE — Anesthesia Procedure Notes (Signed)
Anesthesia Regional Block: Interscalene brachial plexus block   Pre-Anesthetic Checklist: ,, timeout performed, Correct Patient, Correct Site, Correct Laterality, Correct Procedure, Correct Position, site marked, Risks and benefits discussed,  Surgical consent,  Pre-op evaluation,  At surgeon's request and post-op pain management  Laterality: Right  Prep: chloraprep       Needles:  Injection technique: Single-shot  Needle Type: Echogenic Stimulator Needle     Needle Length: 5cm  Needle Gauge: 22     Additional Needles:   Procedures:, nerve stimulator,,,,,,,   Nerve Stimulator or Paresthesia:  Response: biceps flexion, 0.45 mA,   Additional Responses:   Narrative:  Start time: 11/22/2017 8:10 AM End time: 11/22/2017 8:20 AM Injection made incrementally with aspirations every 5 mL.  Performed by: Personally  Anesthesiologist: Albertha Ghee, MD  Additional Notes: Functioning IV was confirmed and monitors were applied.  A 46mm 22ga Arrow echogenic stimulator needle was used. Sterile prep and drape,hand hygiene and sterile gloves were used.  Negative aspiration and negative test dose prior to incremental administration of local anesthetic. The patient tolerated the procedure well.  Ultrasound guidance: relevent anatomy identified, needle position confirmed, local anesthetic spread visualized around nerve(s), vascular puncture avoided.  Image printed for medical record.

## 2017-11-22 NOTE — Op Note (Signed)
Orthopaedic Surgery Operative Note (CSN: [id number])  Aaron Hawkins  [birth year] Date of Surgery: 11/22/2017   Diagnoses:  OA RIGHT SHOULDER  Procedure: Right total shoulder arthroplasty 64403   Operative Finding Successful completion of planned procedure.  B2 glenoid preferentially reamed anteriorly.  Good fixation of glenoid.  Great subscap repair.  Post-operative plan: The patient will be NWB in sling.  The patient will be admitted overnight.  DVT prophylaxis not indicated in isolated upper extremity surgery patient with no specific risks factors.  Pain control with PRN pain medication preferring oral medicines.  Follow up plan will be scheduled in approximately 7 days for incision check and XR ap only.  OK for 30 deg ER in PT immediately  Post-Op Diagnosis: Same Surgeons:Primary: Hiram Gash, MD Assistants:Brandon Leslee Home OPA Location: [facility] OR ROOM 03 Anesthesia: Choice Antibiotics: Ancef 2g preop Tourniquet time: * No tourniquets in log * Estimated Blood Loss: 474 Complications: None Specimens: None Implants: Implant Name Type Inv. Item Serial No. Manufacturer Lot No. LRB No. Used Action  CEMENT BONE DEPUY - [id number] Cement CEMENT BONE DEPUY  DEPUY SYNTHES [id number] Right 1 Implanted  CORTILOC PEGGED GLENOID M40 Shoulder  [id number]   Right 1 Implanted  HEAD HUMERAL AEQUALIS 48X18 - [id number] Head HEAD HUMERAL AEQUALIS 48X18 [id number] [employer]  Right 1 Implanted  HUMERAL STEM AEQUALIS 3BX74MM - [id number] Stem HUMERAL STEM AEQUALIS 3BX74MM [id number] [employer]  Right 1 Implanted    Implants: 3B stem, 84mm hi-offset head, Medium 40 mm glenoid poly.  Indications for Surgery:   Aaron Hawkins is a 77 y.o. 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  Benefits and risks of operative and nonoperative management were discussed prior to surgery with patient/guardian(s) and informed consent form was completed.   Specific risks including infection, need for additional surgery, cuff failure, longevity concerns from implants.   Procedure:   The patient underwent a placement of a right interscalene regional block by the anesthesia service.  he was brought to the operating suite and placed in a supine position on the operating table. General endotracheal anesthesia was administered. Patient was given 2 g of cefazolin intravenously.  he was repositioned into a modified beachchair position. Care was taken to pad all bony prominences and her head was placed in a neutral position. The right upper extremity was prepped and draped in standard sterile fashion.    Standard deltopectoral approach was performed with a #10 blade. We dissected down to the subcutaneous tissues and the cephalic vein was taken laterally with the deltoid. Clavipectoral fascia was incised in line with the incision. Deep retractors were placed. The long of the biceps tendon was identified and there was significant tenosynovitis present.  Tenodesis was performed to the pectoralis tendon with #2 Ethibond. The remaining biceps was followed up into the rotator interval where it was released. The subscapularis was taken down with a lesser tuberosity osteotomy using an osteotome. The osteotomy fragment and underlying capsular elevated off of the humeral neck and the osteophytes inferiorly. #5 Ethibond sutures are passed through the bone tendon junction for later subscap repair. We continued releasing the capsule directly off of the osteophytes inferiorly all the way around the corner. This allowed [place] to dislocate the humeral head. The humeral head had evidence of severe osteoarthritic wear with full-thickness cartilage loss and exposed subchondral bone. There was significant flattening of the humeral head.   The rotator cuff was  carefully examined and noted to be intact without sign of wear.  The decision was confirmed that an anatomic total shoulder was  indicated for this patient.  There were osteophytes along the inferior humeral neck. The osteophytes were removed with an osteotome and a rongeur.  Osteophytes were removed with a rongeur and an osteotome and the anatomic neck was well visualized.   We next made our humeral osteotomy with an oscillating saw along the anatomic neck. The head fragment was passed off the back table and measured approximately 50 mm in diameter. A starter awl was used to open the humeral canal. We reamed with T-handle sound  reamers up an appropriate fit. The chisel was used to remove proximal humeral bone. We then broached starting with a size 1 broach increasing to size 3 which had secure and stable fit. The inclination was set at B. The broach handle was removed and a cut protector was placed. The humerus was retracted posteriorly and we turned our attention to glenoid exposure.  The subscapularis was again identified and immediately we took care to palpate the axillary nerve anteriorly and verify its position with gentle palpation as well as the tug test.  We then released the SGHL with bovie cautery prior to placing a curved mayo at the junction of the anterior glenoid well above the axillary nerve and bluntly dissecting the subscapularis from the capsule.  We then carefully protected the axillary nerve as we gently released the inferior capsule to fully mobilize the subscapularis.  An anterior deltoid retractor was then placed as well as a small Hohmann retractor superiorly.    The glenoid was inspected and had evidence of severe osteoarthritic wear with full-thickness cartilage loss and exposed subchondral bone.  Patient had a B2 glenoid as well with significant posterior wear. The remaining labrum was removed circumferentially taking great care not to disrupt the posterior capsule. The glenoid was sized as listed in the implants above. The center hole was marked and we used a glenoid drill guide to place a guide pin in the  center position. The glenoid was reamed concentrically over the guide pin erring anterior with bone resection primarily reaming the native glenoid vs the neoglenoid. Next the center hole was enlarged and the drill guide for the peripheral pegs was placed. The peripheral pegs were drilled in standard fashion.  A good vault was noted with just a small perf at the anterior inferior border. A trial glenoid was placed and was stable. We removed the trial components.   The glenoid bone was prepared with pulsatile lavage and a sponge. Cement was mixed on the back table the peripheral peg holes were cemented. The glenoid was impacted securely.   We turned attention back to the humeral side. The cut protector was removed. We trialed with multiple size head options and selected a 48 high offset head which re-created the patient's anatomy. The offset was dialed in to match the normal anatomy. The shoulder was trialed.  There was good ROM in all planes and the shoulder was stable with approximately 50% posterior spring back and no inferior translation.  The real humeral implants were opened on the back table and assembled.  The trial was removed. #5 Fiberwire sutures passed through the humeral neck for subscap repair. The humeral component was press-fit obtaining a secure fit.  The joint was reduced and thoroughly irrigated with pulsatile lavage. Subscap and lesser tuberosity osteotomy repaired back in a double row fashion with #5 Fiberwire sutures through bone  tunnels at the biceps groove. Next the rotator interval was closed with #2 ethibond suture. Hemostasis was obtained. The deltopectoral interval was reapproximated with #1 Ethibond. The subcutaneous tissues were closed with 2-0 Vicryl and the skin was closed with a monocryl. The  wounds were cleaned and dried and an Aquacel dressing was placed. The drapes taken down. The arm was placed into sling with abduction pillow. Patient was awakened, extubated, and  transferred to the recovery room in stable condition. There were no intraoperative complications. The sponge, needle, and attention counts were correct at the end of the case.   Joya Gaskins, OPA-C, present and scrubbed throughout the case, critical for completion in a timely fashion, and for retraction, instrumentation, closure.

## 2017-11-23 ENCOUNTER — Encounter (HOSPITAL_COMMUNITY): Payer: Self-pay | Admitting: Orthopaedic Surgery

## 2017-11-23 MED ORDER — ONDANSETRON HCL 4 MG PO TABS: 4.0000 mg | ORAL_TABLET | Freq: Three times a day (TID) | ORAL | 1 refills | Status: AC | PRN

## 2017-11-23 MED ORDER — MELOXICAM 7.5 MG PO TABS: 7.5000 mg | ORAL_TABLET | Freq: Every day | ORAL | 2 refills | Status: AC

## 2017-11-23 MED ORDER — OXYCODONE HCL 5 MG PO TABS
ORAL_TABLET | ORAL | 0 refills | Status: AC
Start: 2017-11-23 — End: 2017-11-28

## 2017-11-23 MED ORDER — ACETAMINOPHEN 500 MG PO TABS: 1000.0000 mg | ORAL_TABLET | Freq: Three times a day (TID) | ORAL | 0 refills | Status: AC

## 2017-11-23 MED ORDER — OMEPRAZOLE 20 MG PO CPDR: 20.0000 mg | DELAYED_RELEASE_CAPSULE | Freq: Every day | ORAL | 0 refills | Status: DC

## 2017-11-23 NOTE — Evaluation (Signed)
Occupational Therapy Evaluation Patient Details Name: Aaron Hawkins MRN: [id number] DOB: [birth year] Today's Date: 11/23/2017    History of Present Illness This 77 y.o. male admitted for Rt TSA.  PMH includes:  GBS, s/p Rt THA    Clinical Impression   Patient evaluated by Occupational Therapy with no further acute OT needs identified. All education has been completed and the patient has no further questions. All education completed.  Wife and pt safe with precautions, ADLs, and sling management.   See below for any follow-up Occupational Therapy or equipment needs. OT is signing off. Thank you for this referral.        Follow Up Recommendations  No OT follow up;Supervision/Assistance - 24 hour;Supervision - Intermittent    Equipment Recommendations  None recommended by OT    Recommendations for Other Services       Precautions / Restrictions Precautions Precautions: Shoulder Type of Shoulder Precautions: no shoulder movement.  OK for wrist, hand and elbow ROM.  Sling on at all times except for ADLs  Shoulder Interventions: Shoulder abduction pillow;At all times;Off for dressing/bathing/exercises Precaution Booklet Issued: Yes (comment) Precaution Comments: shoulder hand out provided  Required Braces or Orthoses: Sling Restrictions Weight Bearing Restrictions: Yes RUE Weight Bearing: Non weight bearing      Mobility Bed Mobility Overal bed mobility: Needs Assistance Bed Mobility: Supine to Sit     Supine to sit: Min guard     General bed mobility comments: Pt instructed in proper technique.  He was instructed of option to sleep in recliner, and how to position pillows under Rt UE   Transfers Overall transfer level: Modified independent                    Balance Overall balance assessment: No apparent balance deficits (not formally assessed)                                         ADL either performed or assessed with clinical judgement    ADL                                               Vision         Perception     Praxis      Pertinent Vitals/Pain Pain Assessment: 0-10 Pain Score: 5  Pain Location: Rt shoulder  Pain Descriptors / Indicators: Aching;Operative site guarding Pain Intervention(s): Monitored during session;Repositioned;Patient requesting pain meds-RN notified     Hand Dominance Right   Extremity/Trunk Assessment Upper Extremity Assessment Upper Extremity Assessment: RUE deficits/detail RUE Deficits / Details: Rt elbow, wrist, and hand WFL    Lower Extremity Assessment Lower Extremity Assessment: Overall WFL for tasks assessed       Communication Communication Communication: No difficulties   Cognition Arousal/Alertness: Awake/alert Behavior During Therapy: WFL for tasks assessed/performed Overall Cognitive Status: Within Functional Limits for tasks assessed                                     General Comments  wife present and instructed in precautions and safety with ADLs     Exercises Exercises: Shoulder Shoulder Exercises Elbow Flexion: AROM;Right;Both;Seated Elbow Extension: AROM;Right;10 reps;Standing;Seated  Wrist Flexion: AROM;Right;5 reps;Seated Wrist Extension: AROM;Right;5 reps;Seated Digit Composite Flexion: AROM;Right;5 reps;Seated Composite Extension: AROM;Right;5 reps;Seated Neck Flexion: AROM;5 reps;Seated Neck Extension: AROM;5 reps;Seated Neck Lateral Flexion - Right: AROM;5 reps;Seated Neck Lateral Flexion - Left: AROM;5 reps;Seated   Shoulder Instructions Shoulder Instructions Donning/doffing shirt without moving shoulder: Caregiver independent with task;Minimal assistance Method for sponge bathing under operated UE: Supervision/safety Donning/doffing sling/immobilizer: Caregiver independent with task Correct positioning of sling/immobilizer: Caregiver independent with task ROM for elbow, wrist and digits of operated  UE: Independent Sling wearing schedule (on at all times/off for ADL's): Independent Proper positioning of operated UE when showering: Independent Positioning of UE while sleeping: Independence expects to be discharged to:: Private residence Living Arrangements: Spouse/significant other Available Help at Discharge: Available 24 hours/day               Bathroom Shower/Tub: Walk-in shower         Home Equipment: [place] - quad;Shower seat - built in          Prior Functioning/Environment Level of Independence: Independent                 OT Problem List: Decreased safety awareness;Decreased knowledge of use of DME or AE;Pain;Impaired UE functional use      OT Treatment/Interventions:      OT Goals(Current goals can be found in the care plan section) Acute Rehab OT Goals Patient Stated Goal: to regain good function of Rt UE  OT Goal Formulation: All assessment and education complete, DC therapy  OT Frequency:     Barriers to D/C:            Co-evaluation              AM-PAC PT "6 Clicks" Daily Activity     Outcome Measure Help from another person eating meals?: None Help from another person taking care of personal grooming?: None Help from another person toileting, which includes using toliet, bedpan, or urinal?: None Help from another person bathing (including washing, rinsing, drying)?: None Help from another person to put on and taking off regular upper body clothing?: None Help from another person to put on and taking off regular lower body clothing?: None 6 Click Score: 24   End of Session Equipment Utilized During Treatment: Other (comment)(sling ) Nurse Communication: Mobility status;Patient requests pain meds  Activity Tolerance: Patient tolerated treatment well Patient left: in bed;Other (comment)(EOB )  OT Visit Diagnosis: Pain Pain - Right/Left: Right Pain - part of body: Shoulder                Time:  7416-3845 OT Time Calculation (min): 47 min Charges:  OT General Charges $OT Visit: 1 Visit OT Evaluation $OT Eval Moderate Complexity: 1 Mod G-Codes:     [facility], OTR/L [phone]   Lucille Passy M 11/23/2017, 1:48 PM

## 2017-11-23 NOTE — Discharge Summary (Signed)
Patient ID: HUIE GHUMAN MRN: [id number] DOB/AGE: 09-07-1940 77 y.o.  Admit date: 11/22/2017 Discharge date: 11/23/2017  Admission Diagnoses:R end stage arthritis  Discharge Diagnoses:  Active Problems:   Localized osteoarthritis of right shoulder   Osteoarthritis of right shoulder   Past Medical History:  Diagnosis Date  . Arthritis    "probably in my knees and right shoulder" (11/22/2017)  . Duodenal ulcer 1964  . Enlarged prostate without urinary obstruction   . History of Guillain-Barre syndrome 2003  . History of hydronephrosis    W/ STRICTURE - not aware of this  . History of kidney stones    "passed it"  . History of melanoma excision    BACK-- 04-04-2007 W/ SLN BX RIGHT AXILL, right arm 2017  . History of nephrolithiasis    right kidney  . Microscopic hematuria    history of  . PONV (postoperative nausea and vomiting)    w/hip replacement  . Prostate cancer ([place]) 05/30/13   gleason 3+3=6, volume 58 cc  . Skin cancer 04/04/2007; 2017   "back, right arm"     Procedures Performed: R total shoulder arthroplasty  Discharged Condition: good  Hospital Course: Patient brought in as an outpatient for surgery.  Tolerated procedure well.  Was kept for monitoring overnight for pain control and medical monitoring postop and was found to be stable for DC home the morning after surgery.  Patient was instructed on specific activity restrictions and all questions were answered.   Consults: None  Significant Diagnostic Studies: No additional pertinent studies  Treatments: Surgery  Discharge Exam:  Dressing CDI and sling well fitting,  full and painless ROM throughout hand with [facility] of 0. + Motor in  AIN, PIN, Ulnar distributions though block still in place proximally precluding axillary nerve testing.  Sensation intact in medial, radial, and ulnar distributions. Well perfused digits.     Disposition: Discharge disposition: 01-Home or Self Care       Discharge  Instructions    Call MD for:  persistant nausea and vomiting   Complete by:  As directed    Call MD for:  redness, tenderness, or signs of infection (pain, swelling, redness, odor or green/yellow discharge around incision site)   Complete by:  As directed    Call MD for:  severe uncontrolled pain   Complete by:  As directed    Diet - low sodium heart healthy   Complete by:  As directed    Discharge instructions   Complete by:  As directed    Ophelia Charter MD, MPH [place]. [address], [address] [phone] (tel)   [phone] (fax)   [place] may leave the operative dressing in place until your follow-up appointment. KEEP THE INCISIONS CLEAN AND DRY. Use the Cryocuff, GameReady or Ice as often as possible for the first 3-4 days, then as needed for pain relief.  You may shower on Post-Op Day #2. The dressing is water resistant but do not scrub it as it may start to peel up.  You may remove the sling for showering, but keep a water resistant pillow under the arm to keep both the elbow and shoulder away from the body (mimicking the abduction sling). Gently pat the area dry. Do not soak the shoulder in water. Do not go swimming in the pool or ocean until your sutures are removed.  EXERCISES Wear the sling at all times except when  doing your exercises. You may remove the sling for showering, but keep the arm across the chest or in a secondary sling.   Accidental/Purposeful External Rotation and shoulder flexion (reaching behind you) is to be avoided at all costs for the first month. Please perform the exercises:   Elbow / Hand / Wrist  Range of Motion Exercises POST-OP A multi-modal approach will be used to treat your pain. Oxycodone - This is a strong narcotic, to be used only on an "as needed" basis for pain. Meloxicam- An anti-inflammatory medication Acetaminophen - A non-narcotic pain  medicine.  Use 1000mg  three times a day for the first 14 days after surgery If you have any adverse effects with the medications, please call our office.  FOLLOW-UP If you develop a Fever (>101.5), Redness or Drainage from the surgical incision site, please call our office to arrange for an evaluation. Please call the office to schedule a follow-up appointment for a wound check, 7-10 days post-operatively.    IF YOU HAVE ANY QUESTIONS, PLEASE FEEL FREE TO CALL OUR OFFICE.   HELPFUL INFORMATION  Your arm will be in a sling following surgery. You will be in this sling for the next 3-4 weeks.  I will let you know the exact duration at your follow-up visit.  You may be more comfortable sleeping in a semi-seated position the first few nights following surgery.  Keep a pillow propped under the elbow and forearm for comfort.  If you have a recliner type of chair it might be beneficial.  If not that is fine too, but it would be helpful to sleep propped up with pillows behind your operated shoulder as well under your elbow and forearm.  This will reduce pulling on the suture lines.  We suggest you use the pain medication the first night prior to going to bed, in order to ease any pain when the anesthesia wears off. You should avoid taking pain medications on an empty stomach as it will make you nauseous.  Do not drink alcoholic beverages or take illicit drugs when taking pain medications.  In most states it is against the law to drive while your arm is in a sling. And certainly against the law to drive while taking narcotics.  You may return to work/school in the next couple of days when you feel up to it. Desk work and typing in the sling is fine.  When dressing, put your operative arm in the sleeve first.  When getting undressed, take your operative arm out last.  Loose fitting, button-down shirts are recommended.  Pain medication may make you constipated.  Below are a few solutions to try in  this order: Decrease the amount of pain medication if you aren't having pain. Drink lots of decaffeinated fluids. Drink prune juice and/or each dried prunes  If the first 3 don't work start with additional solutions Take Colace - an over-the-counter stool softener Take Senokot - an over-the-counter laxative Take Miralax - a stronger over-the-counter laxative   Increase activity slowly   Complete by:  As directed      Allergies as of 11/23/2017   No Known Allergies     Medication List    TAKE these medications   acetaminophen 500 MG tablet Commonly known as:  TYLENOL Take 2 tablets (1,000 mg total) by mouth every 8 (eight) hours for 14 days. What changed:    how much to take  when to take this  reasons to take this   diclofenac  sodium 1 % Gel Commonly known as:  VOLTAREN Apply 1 application topically daily as needed (joint pain).   meloxicam 7.5 MG tablet Commonly known as:  MOBIC Take 1 tablet (7.5 mg total) by mouth daily.   omeprazole 20 MG capsule Commonly known as:  PRILOSEC Take 1 capsule (20 mg total) by mouth daily for 14 days.   ondansetron 4 MG tablet Commonly known as:  ZOFRAN Take 1 tablet (4 mg total) by mouth every 8 (eight) hours as needed for up to 7 days for nausea or vomiting.   oxyCODONE 5 MG immediate release tablet Commonly known as:  Oxy IR/ROXICODONE Take 1-2 pills every 6 hrs as needed for pain   PRESERVISION AREDS 2 Caps Take 1 capsule by mouth 2 (two) times daily.   REFRESH OP Place 2 drops into both eyes daily as needed (dry eyes).   tamsulosin 0.4 MG Caps capsule Commonly known as:  FLOMAX Take 2 capsules (0.8 mg total) by mouth daily.

## 2017-11-23 NOTE — Plan of Care (Signed)
  Problem: Nutrition: Goal: Adequate nutrition will be maintained Outcome: Progressing   Problem: Elimination: Goal: Will not experience complications related to bowel motility Outcome: Progressing   Problem: Pain Managment: Goal: General experience of comfort will improve Outcome: Progressing   Problem: Safety: Goal: Ability to remain free from injury will improve Outcome: Progressing   

## 2017-11-23 NOTE — Progress Notes (Signed)
Provided discharge education/instructions, all questions and concerns addressed, discharged home with belongings accompanied by wife. 

## 2017-11-23 NOTE — Anesthesia Postprocedure Evaluation (Signed)
Anesthesia Post Note  Patient: BRICESON BROADWATER  Procedure(s) Performed: TOTAL SHOULDER ARTHROPLASTY (Right )     Patient location during evaluation: PACU Anesthesia Type: Regional and General Level of consciousness: awake and alert Pain management: pain level controlled Vital Signs Assessment: post-procedure vital signs reviewed and stable Respiratory status: spontaneous breathing, nonlabored ventilation, respiratory function stable and patient connected to nasal cannula oxygen Cardiovascular status: blood pressure returned to baseline and stable Postop Assessment: no apparent nausea or vomiting Anesthetic complications: no    Last Vitals:  Vitals:   11/22/17 2157 11/23/17 0712  BP: 135/82 (!) 146/80  Pulse: 78 70  Resp: 16 16  Temp: 36.6 C 36.6 C  SpO2: 95% 96%    Last Pain:  Vitals:   11/23/17 0712  TempSrc: Oral  PainSc:                  [place] S

## 2017-11-30 DIAGNOSIS — M19011 Primary osteoarthritis, right shoulder: Secondary | ICD-10-CM | POA: Diagnosis not present

## 2017-12-01 DIAGNOSIS — M25511 Pain in right shoulder: Secondary | ICD-10-CM | POA: Diagnosis not present

## 2017-12-01 DIAGNOSIS — M25611 Stiffness of right shoulder, not elsewhere classified: Secondary | ICD-10-CM | POA: Diagnosis not present

## 2017-12-01 DIAGNOSIS — M19011 Primary osteoarthritis, right shoulder: Secondary | ICD-10-CM | POA: Diagnosis not present

## 2017-12-01 DIAGNOSIS — M6281 Muscle weakness (generalized): Secondary | ICD-10-CM | POA: Diagnosis not present

## 2017-12-04 DIAGNOSIS — M25511 Pain in right shoulder: Secondary | ICD-10-CM | POA: Diagnosis not present

## 2017-12-04 DIAGNOSIS — M25611 Stiffness of right shoulder, not elsewhere classified: Secondary | ICD-10-CM | POA: Diagnosis not present

## 2017-12-04 DIAGNOSIS — M6281 Muscle weakness (generalized): Secondary | ICD-10-CM | POA: Diagnosis not present

## 2017-12-04 DIAGNOSIS — M19011 Primary osteoarthritis, right shoulder: Secondary | ICD-10-CM | POA: Diagnosis not present

## 2017-12-07 DIAGNOSIS — M25611 Stiffness of right shoulder, not elsewhere classified: Secondary | ICD-10-CM | POA: Diagnosis not present

## 2017-12-07 DIAGNOSIS — M25511 Pain in right shoulder: Secondary | ICD-10-CM | POA: Diagnosis not present

## 2017-12-07 DIAGNOSIS — M6281 Muscle weakness (generalized): Secondary | ICD-10-CM | POA: Diagnosis not present

## 2017-12-07 DIAGNOSIS — M19011 Primary osteoarthritis, right shoulder: Secondary | ICD-10-CM | POA: Diagnosis not present

## 2017-12-12 DIAGNOSIS — M6281 Muscle weakness (generalized): Secondary | ICD-10-CM | POA: Diagnosis not present

## 2017-12-12 DIAGNOSIS — M25511 Pain in right shoulder: Secondary | ICD-10-CM | POA: Diagnosis not present

## 2017-12-12 DIAGNOSIS — M25611 Stiffness of right shoulder, not elsewhere classified: Secondary | ICD-10-CM | POA: Diagnosis not present

## 2017-12-12 DIAGNOSIS — M19011 Primary osteoarthritis, right shoulder: Secondary | ICD-10-CM | POA: Diagnosis not present

## 2017-12-18 DIAGNOSIS — M25511 Pain in right shoulder: Secondary | ICD-10-CM | POA: Diagnosis not present

## 2017-12-18 DIAGNOSIS — M25611 Stiffness of right shoulder, not elsewhere classified: Secondary | ICD-10-CM | POA: Diagnosis not present

## 2017-12-18 DIAGNOSIS — M19011 Primary osteoarthritis, right shoulder: Secondary | ICD-10-CM | POA: Diagnosis not present

## 2017-12-18 DIAGNOSIS — M6281 Muscle weakness (generalized): Secondary | ICD-10-CM | POA: Diagnosis not present

## 2017-12-21 DIAGNOSIS — M19011 Primary osteoarthritis, right shoulder: Secondary | ICD-10-CM | POA: Diagnosis not present

## 2017-12-22 DIAGNOSIS — M25511 Pain in right shoulder: Secondary | ICD-10-CM | POA: Diagnosis not present

## 2017-12-22 DIAGNOSIS — M25611 Stiffness of right shoulder, not elsewhere classified: Secondary | ICD-10-CM | POA: Diagnosis not present

## 2017-12-22 DIAGNOSIS — M19011 Primary osteoarthritis, right shoulder: Secondary | ICD-10-CM | POA: Diagnosis not present

## 2017-12-22 DIAGNOSIS — M6281 Muscle weakness (generalized): Secondary | ICD-10-CM | POA: Diagnosis not present

## 2017-12-25 DIAGNOSIS — M25511 Pain in right shoulder: Secondary | ICD-10-CM | POA: Diagnosis not present

## 2017-12-25 DIAGNOSIS — M19011 Primary osteoarthritis, right shoulder: Secondary | ICD-10-CM | POA: Diagnosis not present

## 2017-12-25 DIAGNOSIS — M25611 Stiffness of right shoulder, not elsewhere classified: Secondary | ICD-10-CM | POA: Diagnosis not present

## 2017-12-25 DIAGNOSIS — M6281 Muscle weakness (generalized): Secondary | ICD-10-CM | POA: Diagnosis not present

## 2017-12-28 DIAGNOSIS — M25511 Pain in right shoulder: Secondary | ICD-10-CM | POA: Diagnosis not present

## 2017-12-28 DIAGNOSIS — M25611 Stiffness of right shoulder, not elsewhere classified: Secondary | ICD-10-CM | POA: Diagnosis not present

## 2017-12-28 DIAGNOSIS — M6281 Muscle weakness (generalized): Secondary | ICD-10-CM | POA: Diagnosis not present

## 2017-12-28 DIAGNOSIS — M19011 Primary osteoarthritis, right shoulder: Secondary | ICD-10-CM | POA: Diagnosis not present

## 2018-01-01 DIAGNOSIS — M25611 Stiffness of right shoulder, not elsewhere classified: Secondary | ICD-10-CM | POA: Diagnosis not present

## 2018-01-01 DIAGNOSIS — M6281 Muscle weakness (generalized): Secondary | ICD-10-CM | POA: Diagnosis not present

## 2018-01-01 DIAGNOSIS — M19011 Primary osteoarthritis, right shoulder: Secondary | ICD-10-CM | POA: Diagnosis not present

## 2018-01-01 DIAGNOSIS — M25511 Pain in right shoulder: Secondary | ICD-10-CM | POA: Diagnosis not present

## 2018-01-02 DIAGNOSIS — H6123 Impacted cerumen, bilateral: Secondary | ICD-10-CM | POA: Diagnosis not present

## 2018-01-04 DIAGNOSIS — M25511 Pain in right shoulder: Secondary | ICD-10-CM | POA: Diagnosis not present

## 2018-01-04 DIAGNOSIS — M19011 Primary osteoarthritis, right shoulder: Secondary | ICD-10-CM | POA: Diagnosis not present

## 2018-01-04 DIAGNOSIS — M6281 Muscle weakness (generalized): Secondary | ICD-10-CM | POA: Diagnosis not present

## 2018-01-04 DIAGNOSIS — M25611 Stiffness of right shoulder, not elsewhere classified: Secondary | ICD-10-CM | POA: Diagnosis not present

## 2018-01-10 DIAGNOSIS — M25611 Stiffness of right shoulder, not elsewhere classified: Secondary | ICD-10-CM | POA: Diagnosis not present

## 2018-01-10 DIAGNOSIS — M19011 Primary osteoarthritis, right shoulder: Secondary | ICD-10-CM | POA: Diagnosis not present

## 2018-01-10 DIAGNOSIS — M6281 Muscle weakness (generalized): Secondary | ICD-10-CM | POA: Diagnosis not present

## 2018-01-10 DIAGNOSIS — M25511 Pain in right shoulder: Secondary | ICD-10-CM | POA: Diagnosis not present

## 2018-01-12 DIAGNOSIS — M25611 Stiffness of right shoulder, not elsewhere classified: Secondary | ICD-10-CM | POA: Diagnosis not present

## 2018-01-12 DIAGNOSIS — M19011 Primary osteoarthritis, right shoulder: Secondary | ICD-10-CM | POA: Diagnosis not present

## 2018-01-12 DIAGNOSIS — M6281 Muscle weakness (generalized): Secondary | ICD-10-CM | POA: Diagnosis not present

## 2018-01-12 DIAGNOSIS — M25511 Pain in right shoulder: Secondary | ICD-10-CM | POA: Diagnosis not present

## 2018-01-16 DIAGNOSIS — M6281 Muscle weakness (generalized): Secondary | ICD-10-CM | POA: Diagnosis not present

## 2018-01-16 DIAGNOSIS — M25511 Pain in right shoulder: Secondary | ICD-10-CM | POA: Diagnosis not present

## 2018-01-16 DIAGNOSIS — M19011 Primary osteoarthritis, right shoulder: Secondary | ICD-10-CM | POA: Diagnosis not present

## 2018-01-16 DIAGNOSIS — M25611 Stiffness of right shoulder, not elsewhere classified: Secondary | ICD-10-CM | POA: Diagnosis not present

## 2018-01-19 DIAGNOSIS — M25611 Stiffness of right shoulder, not elsewhere classified: Secondary | ICD-10-CM | POA: Diagnosis not present

## 2018-01-19 DIAGNOSIS — M25511 Pain in right shoulder: Secondary | ICD-10-CM | POA: Diagnosis not present

## 2018-01-19 DIAGNOSIS — M6281 Muscle weakness (generalized): Secondary | ICD-10-CM | POA: Diagnosis not present

## 2018-01-19 DIAGNOSIS — M19011 Primary osteoarthritis, right shoulder: Secondary | ICD-10-CM | POA: Diagnosis not present

## 2018-01-22 DIAGNOSIS — M6281 Muscle weakness (generalized): Secondary | ICD-10-CM | POA: Diagnosis not present

## 2018-01-22 DIAGNOSIS — M25511 Pain in right shoulder: Secondary | ICD-10-CM | POA: Diagnosis not present

## 2018-01-22 DIAGNOSIS — M19011 Primary osteoarthritis, right shoulder: Secondary | ICD-10-CM | POA: Diagnosis not present

## 2018-01-22 DIAGNOSIS — M25611 Stiffness of right shoulder, not elsewhere classified: Secondary | ICD-10-CM | POA: Diagnosis not present

## 2018-01-25 DIAGNOSIS — M25611 Stiffness of right shoulder, not elsewhere classified: Secondary | ICD-10-CM | POA: Diagnosis not present

## 2018-01-25 DIAGNOSIS — M6281 Muscle weakness (generalized): Secondary | ICD-10-CM | POA: Diagnosis not present

## 2018-01-25 DIAGNOSIS — M25511 Pain in right shoulder: Secondary | ICD-10-CM | POA: Diagnosis not present

## 2018-01-25 DIAGNOSIS — M19011 Primary osteoarthritis, right shoulder: Secondary | ICD-10-CM | POA: Diagnosis not present

## 2018-01-29 DIAGNOSIS — M25511 Pain in right shoulder: Secondary | ICD-10-CM | POA: Diagnosis not present

## 2018-01-29 DIAGNOSIS — M19011 Primary osteoarthritis, right shoulder: Secondary | ICD-10-CM | POA: Diagnosis not present

## 2018-01-29 DIAGNOSIS — M25611 Stiffness of right shoulder, not elsewhere classified: Secondary | ICD-10-CM | POA: Diagnosis not present

## 2018-01-29 DIAGNOSIS — M6281 Muscle weakness (generalized): Secondary | ICD-10-CM | POA: Diagnosis not present

## 2018-02-01 DIAGNOSIS — Z96611 Presence of right artificial shoulder joint: Secondary | ICD-10-CM | POA: Diagnosis not present

## 2018-02-01 DIAGNOSIS — M19011 Primary osteoarthritis, right shoulder: Secondary | ICD-10-CM | POA: Diagnosis not present

## 2018-02-01 DIAGNOSIS — M25611 Stiffness of right shoulder, not elsewhere classified: Secondary | ICD-10-CM | POA: Diagnosis not present

## 2018-02-01 DIAGNOSIS — M6281 Muscle weakness (generalized): Secondary | ICD-10-CM | POA: Diagnosis not present

## 2018-02-06 DIAGNOSIS — M19011 Primary osteoarthritis, right shoulder: Secondary | ICD-10-CM | POA: Diagnosis not present

## 2018-02-06 DIAGNOSIS — M25611 Stiffness of right shoulder, not elsewhere classified: Secondary | ICD-10-CM | POA: Diagnosis not present

## 2018-02-06 DIAGNOSIS — M6281 Muscle weakness (generalized): Secondary | ICD-10-CM | POA: Diagnosis not present

## 2018-02-06 DIAGNOSIS — M25511 Pain in right shoulder: Secondary | ICD-10-CM | POA: Diagnosis not present

## 2018-02-09 DIAGNOSIS — M25611 Stiffness of right shoulder, not elsewhere classified: Secondary | ICD-10-CM | POA: Diagnosis not present

## 2018-02-09 DIAGNOSIS — M19011 Primary osteoarthritis, right shoulder: Secondary | ICD-10-CM | POA: Diagnosis not present

## 2018-02-09 DIAGNOSIS — M25511 Pain in right shoulder: Secondary | ICD-10-CM | POA: Diagnosis not present

## 2018-02-09 DIAGNOSIS — M6281 Muscle weakness (generalized): Secondary | ICD-10-CM | POA: Diagnosis not present

## 2018-02-13 DIAGNOSIS — M6281 Muscle weakness (generalized): Secondary | ICD-10-CM | POA: Diagnosis not present

## 2018-02-13 DIAGNOSIS — M25611 Stiffness of right shoulder, not elsewhere classified: Secondary | ICD-10-CM | POA: Diagnosis not present

## 2018-02-13 DIAGNOSIS — M19011 Primary osteoarthritis, right shoulder: Secondary | ICD-10-CM | POA: Diagnosis not present

## 2018-02-13 DIAGNOSIS — M25511 Pain in right shoulder: Secondary | ICD-10-CM | POA: Diagnosis not present

## 2018-02-16 DIAGNOSIS — M19011 Primary osteoarthritis, right shoulder: Secondary | ICD-10-CM | POA: Diagnosis not present

## 2018-02-16 DIAGNOSIS — M25511 Pain in right shoulder: Secondary | ICD-10-CM | POA: Diagnosis not present

## 2018-02-16 DIAGNOSIS — M25611 Stiffness of right shoulder, not elsewhere classified: Secondary | ICD-10-CM | POA: Diagnosis not present

## 2018-02-16 DIAGNOSIS — M6281 Muscle weakness (generalized): Secondary | ICD-10-CM | POA: Diagnosis not present

## 2018-02-19 DIAGNOSIS — Z96611 Presence of right artificial shoulder joint: Secondary | ICD-10-CM | POA: Diagnosis not present

## 2018-02-19 DIAGNOSIS — M6281 Muscle weakness (generalized): Secondary | ICD-10-CM | POA: Diagnosis not present

## 2018-02-19 DIAGNOSIS — M25611 Stiffness of right shoulder, not elsewhere classified: Secondary | ICD-10-CM | POA: Diagnosis not present

## 2018-02-19 DIAGNOSIS — Z8546 Personal history of malignant neoplasm of prostate: Secondary | ICD-10-CM | POA: Diagnosis not present

## 2018-02-19 DIAGNOSIS — M19011 Primary osteoarthritis, right shoulder: Secondary | ICD-10-CM | POA: Diagnosis not present

## 2018-02-22 DIAGNOSIS — M19011 Primary osteoarthritis, right shoulder: Secondary | ICD-10-CM | POA: Diagnosis not present

## 2018-02-23 DIAGNOSIS — M19011 Primary osteoarthritis, right shoulder: Secondary | ICD-10-CM | POA: Diagnosis not present

## 2018-02-23 DIAGNOSIS — M25611 Stiffness of right shoulder, not elsewhere classified: Secondary | ICD-10-CM | POA: Diagnosis not present

## 2018-02-23 DIAGNOSIS — M25511 Pain in right shoulder: Secondary | ICD-10-CM | POA: Diagnosis not present

## 2018-02-23 DIAGNOSIS — M6281 Muscle weakness (generalized): Secondary | ICD-10-CM | POA: Diagnosis not present

## 2018-02-26 DIAGNOSIS — Z8546 Personal history of malignant neoplasm of prostate: Secondary | ICD-10-CM | POA: Diagnosis not present

## 2018-02-26 DIAGNOSIS — N529 Male erectile dysfunction, unspecified: Secondary | ICD-10-CM | POA: Diagnosis not present

## 2018-02-27 DIAGNOSIS — M6281 Muscle weakness (generalized): Secondary | ICD-10-CM | POA: Diagnosis not present

## 2018-02-27 DIAGNOSIS — M25511 Pain in right shoulder: Secondary | ICD-10-CM | POA: Diagnosis not present

## 2018-02-27 DIAGNOSIS — M25611 Stiffness of right shoulder, not elsewhere classified: Secondary | ICD-10-CM | POA: Diagnosis not present

## 2018-02-27 DIAGNOSIS — M19011 Primary osteoarthritis, right shoulder: Secondary | ICD-10-CM | POA: Diagnosis not present

## 2018-03-02 DIAGNOSIS — M19011 Primary osteoarthritis, right shoulder: Secondary | ICD-10-CM | POA: Diagnosis not present

## 2018-03-02 DIAGNOSIS — M25611 Stiffness of right shoulder, not elsewhere classified: Secondary | ICD-10-CM | POA: Diagnosis not present

## 2018-03-02 DIAGNOSIS — M25511 Pain in right shoulder: Secondary | ICD-10-CM | POA: Diagnosis not present

## 2018-03-02 DIAGNOSIS — M6281 Muscle weakness (generalized): Secondary | ICD-10-CM | POA: Diagnosis not present

## 2018-03-08 DIAGNOSIS — Z85820 Personal history of malignant melanoma of skin: Secondary | ICD-10-CM | POA: Diagnosis not present

## 2018-03-08 DIAGNOSIS — D2262 Melanocytic nevi of left upper limb, including shoulder: Secondary | ICD-10-CM | POA: Diagnosis not present

## 2018-03-08 DIAGNOSIS — L812 Freckles: Secondary | ICD-10-CM | POA: Diagnosis not present

## 2018-03-08 DIAGNOSIS — D1801 Hemangioma of skin and subcutaneous tissue: Secondary | ICD-10-CM | POA: Diagnosis not present

## 2018-03-08 DIAGNOSIS — D2271 Melanocytic nevi of right lower limb, including hip: Secondary | ICD-10-CM | POA: Diagnosis not present

## 2018-03-08 DIAGNOSIS — D2272 Melanocytic nevi of left lower limb, including hip: Secondary | ICD-10-CM | POA: Diagnosis not present

## 2018-03-08 DIAGNOSIS — L821 Other seborrheic keratosis: Secondary | ICD-10-CM | POA: Diagnosis not present

## 2018-03-08 DIAGNOSIS — D225 Melanocytic nevi of trunk: Secondary | ICD-10-CM | POA: Diagnosis not present

## 2018-04-06 DIAGNOSIS — H26491 Other secondary cataract, right eye: Secondary | ICD-10-CM | POA: Diagnosis not present

## 2018-04-06 DIAGNOSIS — H35711 Central serous chorioretinopathy, right eye: Secondary | ICD-10-CM | POA: Diagnosis not present

## 2018-04-06 DIAGNOSIS — H353132 Nonexudative age-related macular degeneration, bilateral, intermediate dry stage: Secondary | ICD-10-CM | POA: Diagnosis not present

## 2018-04-06 DIAGNOSIS — H35033 Hypertensive retinopathy, bilateral: Secondary | ICD-10-CM | POA: Diagnosis not present

## 2018-04-09 DIAGNOSIS — R030 Elevated blood-pressure reading, without diagnosis of hypertension: Secondary | ICD-10-CM | POA: Diagnosis not present

## 2018-06-20 DIAGNOSIS — E7800 Pure hypercholesterolemia, unspecified: Secondary | ICD-10-CM | POA: Diagnosis not present

## 2018-06-20 DIAGNOSIS — Z131 Encounter for screening for diabetes mellitus: Secondary | ICD-10-CM | POA: Diagnosis not present

## 2018-06-27 DIAGNOSIS — I10 Essential (primary) hypertension: Secondary | ICD-10-CM | POA: Diagnosis not present

## 2018-06-27 DIAGNOSIS — Z1389 Encounter for screening for other disorder: Secondary | ICD-10-CM | POA: Diagnosis not present

## 2018-06-27 DIAGNOSIS — M1990 Unspecified osteoarthritis, unspecified site: Secondary | ICD-10-CM | POA: Diagnosis not present

## 2018-06-27 DIAGNOSIS — E7800 Pure hypercholesterolemia, unspecified: Secondary | ICD-10-CM | POA: Diagnosis not present

## 2018-06-27 DIAGNOSIS — C61 Malignant neoplasm of prostate: Secondary | ICD-10-CM | POA: Diagnosis not present

## 2018-06-27 DIAGNOSIS — Z85820 Personal history of malignant melanoma of skin: Secondary | ICD-10-CM | POA: Diagnosis not present

## 2018-06-27 DIAGNOSIS — Z0000 Encounter for general adult medical examination without abnormal findings: Secondary | ICD-10-CM | POA: Diagnosis not present

## 2018-07-10 ENCOUNTER — Other Ambulatory Visit: Payer: Self-pay | Admitting: Family Medicine

## 2018-07-10 DIAGNOSIS — R109 Unspecified abdominal pain: Secondary | ICD-10-CM | POA: Diagnosis not present

## 2018-07-10 DIAGNOSIS — R110 Nausea: Secondary | ICD-10-CM

## 2018-07-10 DIAGNOSIS — R1011 Right upper quadrant pain: Secondary | ICD-10-CM

## 2018-07-10 DIAGNOSIS — R21 Rash and other nonspecific skin eruption: Secondary | ICD-10-CM | POA: Diagnosis not present

## 2018-07-17 ENCOUNTER — Other Ambulatory Visit: Payer: Medicare Other

## 2018-07-18 DIAGNOSIS — Z8619 Personal history of other infectious and parasitic diseases: Secondary | ICD-10-CM

## 2018-07-18 HISTORY — DX: Personal history of other infectious and parasitic diseases: Z86.19

## 2018-07-24 ENCOUNTER — Ambulatory Visit
Admission: RE | Admit: 2018-07-24 | Discharge: 2018-07-24 | Disposition: A | Discharge status: Home / Self Care | Payer: Medicare Other | Source: Ambulatory Visit | Attending: Family Medicine | Admitting: Family Medicine

## 2018-07-24 DIAGNOSIS — R1011 Right upper quadrant pain: Secondary | ICD-10-CM

## 2018-07-24 DIAGNOSIS — I714 Abdominal aortic aneurysm, without rupture: Secondary | ICD-10-CM | POA: Diagnosis not present

## 2018-07-24 DIAGNOSIS — R110 Nausea: Secondary | ICD-10-CM

## 2018-07-25 DIAGNOSIS — B029 Zoster without complications: Secondary | ICD-10-CM | POA: Diagnosis not present

## 2018-07-25 DIAGNOSIS — R899 Unspecified abnormal finding in specimens from other organs, systems and tissues: Secondary | ICD-10-CM | POA: Diagnosis not present

## 2018-07-25 DIAGNOSIS — I714 Abdominal aortic aneurysm, without rupture: Secondary | ICD-10-CM | POA: Diagnosis not present

## 2018-07-25 DIAGNOSIS — I10 Essential (primary) hypertension: Secondary | ICD-10-CM | POA: Diagnosis not present

## 2018-08-09 DIAGNOSIS — B0229 Other postherpetic nervous system involvement: Secondary | ICD-10-CM | POA: Diagnosis not present

## 2018-08-14 ENCOUNTER — Encounter: Payer: Self-pay | Admitting: Vascular Surgery

## 2018-08-14 ENCOUNTER — Other Ambulatory Visit: Payer: Self-pay

## 2018-08-14 ENCOUNTER — Ambulatory Visit (INDEPENDENT_AMBULATORY_CARE_PROVIDER_SITE_OTHER): Payer: Medicare Other | Admitting: Vascular Surgery

## 2018-08-14 VITALS — BP 140/92 | HR 85 | Temp 98.1°F | Resp 20 | Ht 68.0 in | Wt 170.0 lb

## 2018-08-14 DIAGNOSIS — I714 Abdominal aortic aneurysm, without rupture, unspecified: Secondary | ICD-10-CM

## 2018-08-14 NOTE — Progress Notes (Signed)
Vascular and Vein Specialist of [place]  Patient name: Aaron Hawkins MRN: [id number] DOB: June 27, 1941 Sex: male  REASON FOR CONSULT: Discuss recent diagnosis of abdominal aortic aneurysm  HPI: Aaron Hawkins is a 78 y.o. male, who is here today for discussion of ultrasound showing 4.8 cm abdominal aortic aneurysm.  He is here today with his wife.  He had abdominal discomfort which turned out to be probable shingles.  Ultrasound was obtained for evaluation of this and had an incidental finding of 4.8 cm infrarenal aneurysm.  He had no prior knowledge of his aneurysm.  No family history of aneurysm.  Aaron Hawkins is very active and good health with no history of cardiac disease have a history of prostate cancer  Past Medical History:  Diagnosis Date  . Arthritis    "probably in my knees and right shoulder" (11/22/2017)  . Duodenal ulcer 1964  . Enlarged prostate without urinary obstruction   . History of Guillain-Barre syndrome 2003  . History of hydronephrosis    W/ STRICTURE - not aware of this  . History of kidney stones    "passed it"  . History of melanoma excision    BACK-- 04-04-2007 W/ SLN BX RIGHT AXILL, right arm 2017  . History of nephrolithiasis    right kidney  . Microscopic hematuria    history of  . PONV (postoperative nausea and vomiting)    w/hip replacement  . Prostate cancer ([place]) 05/30/13   gleason 3+3=6, volume 58 cc  . Skin cancer 04/04/2007; 2017   "back, right arm"    Family History  Problem Relation Age of Onset  . Cancer Father        prostate  . Cancer Brother        prostate    SOCIAL HISTORY: Social History   Socioeconomic History  . Marital status: Married    Spouse name: Not on file  . Number of children: Not on file  . Years of education: Not on file  . Highest education level: Not on file  Occupational History  . Not on file  Social Needs  . Financial resource strain: Not on file  . Food insecurity:      Worry: Not on file    Inability: Not on file  . Transportation needs:    Medical: Not on file    Non-medical: Not on file  Tobacco Use  . Smoking status: Former Smoker    Packs/day: 1.00    Years: 7.00    Pack years: 7.00    Types: Cigarettes    Last attempt to quit: 06/18/1966    Years since quitting: 52.1  . Smokeless tobacco: Never Used  Substance and Sexual Activity  . Alcohol use: Yes    Alcohol/week: 2.0 standard drinks    Types: 2 Glasses of wine per week  . Drug use: No  . Sexual activity: Not Currently  Lifestyle  . Physical activity:    Days per week: Not on file    Minutes per session: Not on file  . Stress: Not on file  Relationships  . Social connections:    Talks on phone: Not on file    Gets together: Not on file    Attends religious service: Not on file    Active member of club or organization: Not on file    Attends meetings of clubs or organizations: Not on file    Relationship status: Not on file  . Intimate partner violence:  Fear of current or ex partner: Not on file    Emotionally abused: Not on file    Physically abused: Not on file    Forced sexual activity: Not on file  Other Topics Concern  . Not on file  Social History Narrative  . Not on file    No Known Allergies  Current Outpatient Medications  Medication Sig Dispense Refill  . acetaminophen (TYLENOL) 500 MG tablet Take 500 mg by mouth every 6 (six) hours as needed.    . lidocaine (LIDODERM) 5 % Place 1 patch onto the skin daily. Remove & Discard patch within 12 hours or as directed by MD    . Multiple Vitamins-Minerals (PRESERVISION AREDS 2) CAPS Take 1 capsule by mouth 2 (two) times daily.    . Polyvinyl Alcohol-Povidone (REFRESH OP) Place 2 drops into both eyes daily as needed (dry eyes).    Marland Kitchen diclofenac sodium (VOLTAREN) 1 % GEL Apply 1 application topically daily as needed (joint pain).    . meloxicam (MOBIC) 7.5 MG tablet Take 1 tablet (7.5 mg total) by mouth daily.  (Patient not taking: Reported on 08/14/2018) 30 tablet 2  . omeprazole (PRILOSEC) 20 MG capsule Take 1 capsule (20 mg total) by mouth daily for 14 days. 14 capsule 0  . tamsulosin (FLOMAX) 0.4 MG CAPS capsule Take 2 capsules (0.8 mg total) by mouth daily. (Patient not taking: Reported on 11/07/2017) 60 capsule 11   No current facility-administered medications for this visit.     REVIEW OF SYSTEMS:  [X]  denotes positive finding, [ ]  denotes negative finding Cardiac  Comments:  Chest pain or chest pressure:    Shortness of breath upon exertion:    Short of breath when lying flat:    Irregular heart rhythm:        Vascular    Pain in calf, thigh, or hip brought on by ambulation:    Pain in feet at night that wakes you up from your sleep:     Blood clot in your veins:    Leg swelling:         Pulmonary    Oxygen at home:    Productive cough:     Wheezing:         Neurologic    Sudden weakness in arms or legs:     Sudden numbness in arms or legs:     Sudden onset of difficulty speaking or slurred speech:    Temporary loss of vision in one eye:     Problems with dizziness:         Gastrointestinal    Blood in stool:     Vomited blood:         Genitourinary    Burning when urinating:     Blood in urine:        Psychiatric    Major depression:         Hematologic    Bleeding problems:    Problems with blood clotting too easily:        Skin    Rashes or ulcers: x  recently from shingles      Constitutional    Fever or chills:      PHYSICAL EXAM: Vitals:   08/14/18 0944 08/14/18 0947  BP: (!) 168/100 (!) 140/92  Pulse: 85   Resp: 20   Temp: 98.1 F (36.7 C)   TempSrc: Oral   Weight: 170 lb (77.1 kg)   Height: 5\' 8"  (1.727 m)  GENERAL: The patient is a well-nourished male, in no acute distress. The vital signs are documented above. CARDIOVASCULAR: Carotid arteries are without bruits bilaterally.  He has 2+ radial and 2+ femoral pulses.  2-3+ popliteal pulses  and 2+ dorsalis pedis pulses PULMONARY: There is good air exchange  ABDOMEN: Soft and non-tender.  He does have a palpable nontender abdominal aortic aneurysm MUSCULOSKELETAL: There are no major deformities or cyanosis. NEUROLOGIC: No focal weakness or paresthesias are detected. SKIN: There are no ulcers or rashes noted. PSYCHIATRIC: The patient has a normal affect.  DATA:  Ultrasound from 07/24/2018 reveals 4.8 cm aneurysm  MEDICAL ISSUES: Had long discussion with the patient and his wife.  Explained the significance of his moderate size aneurysm.  Have recommended that we see him in 6 months with CT angiogram of his abdomen and pelvis for further definition.  Explained that he is below the threshold recommended elective repair this would be in the 5 to 5-1/2 cm range.  I did describe symptoms of aneurysm and the urgency to present to [facility] immediately should this occur.  I discussed open and stent graft repair of aneurysm.  Explained that he should not have to do any limitation regarding activity but should continue to maintain adequate blood pressure control.  We will see him again in 6 months with CT scan   [name], MD [facility] Vascular and Vein Specialists of [facility] Tel [phone] Pager [phone]

## 2018-08-21 DIAGNOSIS — Z8546 Personal history of malignant neoplasm of prostate: Secondary | ICD-10-CM | POA: Diagnosis not present

## 2018-08-24 DIAGNOSIS — H35711 Central serous chorioretinopathy, right eye: Secondary | ICD-10-CM | POA: Diagnosis not present

## 2018-08-24 DIAGNOSIS — H52221 Regular astigmatism, right eye: Secondary | ICD-10-CM | POA: Diagnosis not present

## 2018-08-24 DIAGNOSIS — H353132 Nonexudative age-related macular degeneration, bilateral, intermediate dry stage: Secondary | ICD-10-CM | POA: Diagnosis not present

## 2018-08-24 DIAGNOSIS — H524 Presbyopia: Secondary | ICD-10-CM | POA: Diagnosis not present

## 2018-08-24 DIAGNOSIS — H5202 Hypermetropia, left eye: Secondary | ICD-10-CM | POA: Diagnosis not present

## 2018-08-24 DIAGNOSIS — H26491 Other secondary cataract, right eye: Secondary | ICD-10-CM | POA: Diagnosis not present

## 2018-08-24 DIAGNOSIS — H35033 Hypertensive retinopathy, bilateral: Secondary | ICD-10-CM | POA: Diagnosis not present

## 2018-08-28 DIAGNOSIS — Z8546 Personal history of malignant neoplasm of prostate: Secondary | ICD-10-CM | POA: Diagnosis not present

## 2018-08-30 DIAGNOSIS — Z85820 Personal history of malignant melanoma of skin: Secondary | ICD-10-CM | POA: Diagnosis not present

## 2018-08-30 DIAGNOSIS — D225 Melanocytic nevi of trunk: Secondary | ICD-10-CM | POA: Diagnosis not present

## 2018-08-30 DIAGNOSIS — B0229 Other postherpetic nervous system involvement: Secondary | ICD-10-CM | POA: Diagnosis not present

## 2018-08-30 DIAGNOSIS — D2271 Melanocytic nevi of right lower limb, including hip: Secondary | ICD-10-CM | POA: Diagnosis not present

## 2018-08-30 DIAGNOSIS — D2272 Melanocytic nevi of left lower limb, including hip: Secondary | ICD-10-CM | POA: Diagnosis not present

## 2018-08-30 DIAGNOSIS — L812 Freckles: Secondary | ICD-10-CM | POA: Diagnosis not present

## 2018-08-30 DIAGNOSIS — D1801 Hemangioma of skin and subcutaneous tissue: Secondary | ICD-10-CM | POA: Diagnosis not present

## 2018-08-30 DIAGNOSIS — L821 Other seborrheic keratosis: Secondary | ICD-10-CM | POA: Diagnosis not present

## 2018-11-23 DIAGNOSIS — M19011 Primary osteoarthritis, right shoulder: Secondary | ICD-10-CM | POA: Diagnosis not present

## 2018-12-26 IMAGING — DX DG SHOULDER 2+V PORT*R*
1 series · 1 of 1 positions shown · non-contrast
Comparison: CT scan of November 13, 2017.

CLINICAL DATA: Status post right shoulder arthroplasty.

EXAM:
PORTABLE RIGHT SHOULDER

[shoulder ap]
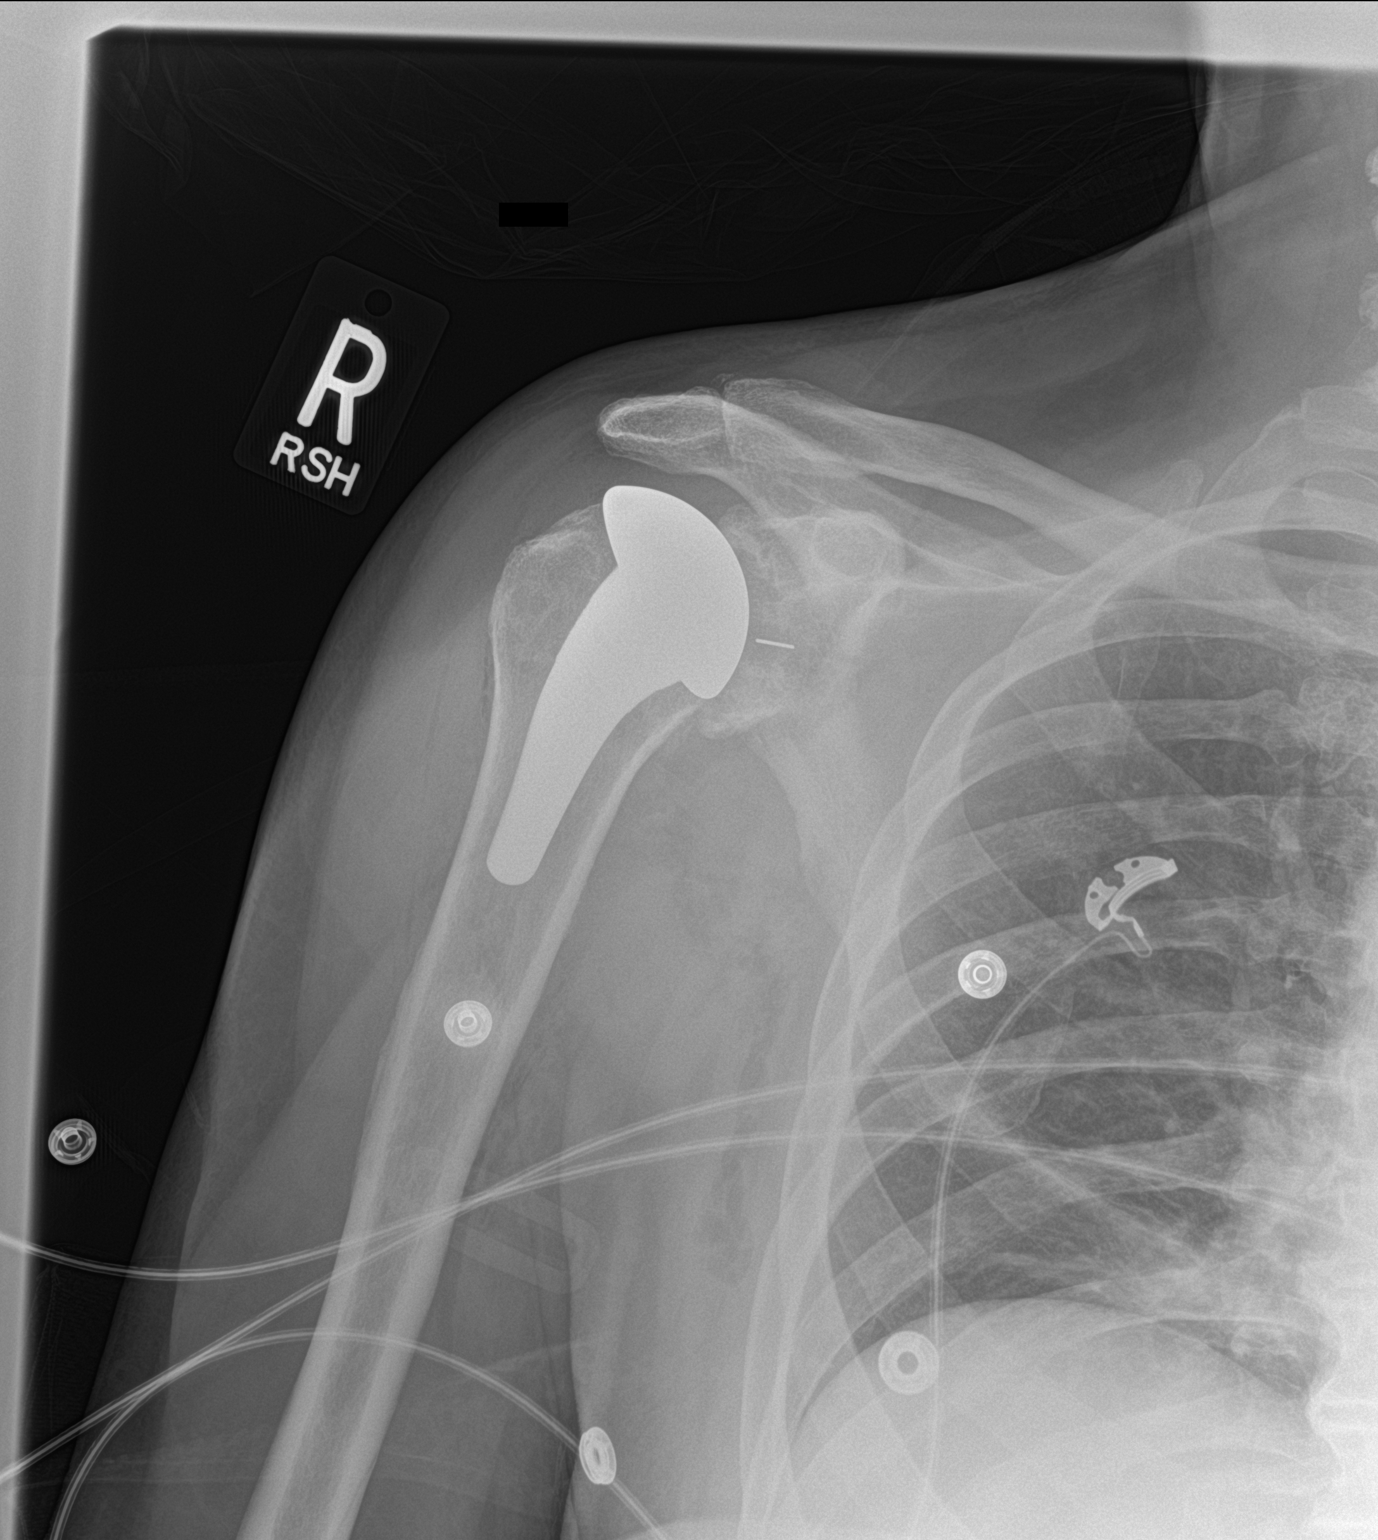

[1 of 1 positions shown; findings below may reference images not displayed]

FINDINGS: Right shoulder prosthesis appears to be well positioned. No fracture
or dislocation is noted. Visualized ribs appear normal.
IMPRESSION: Status post right shoulder arthroplasty.

## 2018-12-27 ENCOUNTER — Other Ambulatory Visit: Payer: Self-pay | Admitting: Vascular Surgery

## 2018-12-27 DIAGNOSIS — I714 Abdominal aortic aneurysm, without rupture, unspecified: Secondary | ICD-10-CM

## 2019-01-25 ENCOUNTER — Ambulatory Visit
Admission: RE | Admit: 2019-01-25 | Discharge: 2019-01-25 | Disposition: A | Discharge status: Home / Self Care | Payer: Medicare Other | Source: Ambulatory Visit | Attending: Vascular Surgery | Admitting: Vascular Surgery

## 2019-01-25 DIAGNOSIS — I714 Abdominal aortic aneurysm, without rupture, unspecified: Secondary | ICD-10-CM

## 2019-01-25 DIAGNOSIS — I728 Aneurysm of other specified arteries: Secondary | ICD-10-CM | POA: Diagnosis not present

## 2019-01-25 DIAGNOSIS — I723 Aneurysm of iliac artery: Secondary | ICD-10-CM | POA: Diagnosis not present

## 2019-01-25 MED ORDER — IOPAMIDOL (ISOVUE-370) INJECTION 76%
75.0000 mL | Freq: Once | INTRAVENOUS | Status: AC | PRN
  Administered 2019-01-25: 75 mL via INTRAVENOUS

## 2019-02-01 ENCOUNTER — Telehealth: Payer: Self-pay | Admitting: *Deleted

## 2019-02-01 NOTE — Telephone Encounter (Signed)
Virtual Visit Pre-Appointment Phone Call  Today, I spoke with Aaron Hawkins and performed the following actions:  1. I explained that we are currently trying to limit exposure to the COVID-19 virus by seeing patients at home rather than in the office.  I explained that the visits are best done by video, but can be done by telephone.  I asked the patient if a virtual visit that the patient would like to try instead of coming into the office. Aaron Hawkins agreed to proceed with the virtual visit scheduled with Dr.Todd Early On 02/05/19 .      2. I confirmed the BEST phone number to call the day of the visit and- I included this in appointment notes.  3. I asked if the patient had access to (through a family member/friend) a smartphone with video capability to be used for his visit?"  The patient said yes -    4. I confirmed consent by  a. sending through [employer] or by email the [facility] as written at the end of this message or  b. verbally as listed below. i. This visit is being performed in the setting of COVID-19. ii. All virtual visits are billed to your insurance company just like a normal visit would be.   iii. We'd like you to understand that the technology does not allow for your provider to perform an examination, and thus may limit your provider's ability to fully assess your condition.  iv. If your provider identifies any concerns that need to be evaluated in person, we will make arrangements to do so.   v. Finally, though the technology is pretty good, we cannot assure that it will always work on either your or our end, and in the setting of a video visit, we may have to convert it to a phone-only visit.  In either situation, we cannot ensure that we have a secure connection.   vi. Are you willing to proceed?"  STAFF: Did the patient verbally acknowledge consent to telehealth visit? Document YES/NO here: YES  2. I advised the patient to be  prepared - I asked that the patient, on the day of his visit, record any information possible with the equipment at his home, such as blood pressure, pulse, oxygen saturation, and your weight and write them all down. I asked the patient to have a pen and paper handy nearby the day of the visit as well.  3. If the patient was scheduled for a video visit, I informed the patient that the visit with the doctor would start with a text to the smartphone # given to [place] by the patient.         If the patient was scheduled for a telephone call, I informed the patient that the visit with the doctor would start with a call to the telephone # given to [place] by the patient.  4. I Informed patient they will receive a phone call 15 minutes prior to their appointment time from a [place] or nurse to review medications, allergies, etc. to prepare for the visit.    TELEPHONE CALL NOTE  Aaron Hawkins has been deemed a candidate for a follow-up tele-health visit to limit community exposure during the Covid-19 pandemic. I spoke with the patient via phone to ensure availability of phone/video source, confirm preferred email & phone number, and discuss instructions and expectations.  I reminded Aaron Hawkins to be prepared with any vital  sign and/or heart rhythm information that could potentially be obtained via home monitoring, at the time of his visit. I reminded Aaron Hawkins to expect a phone call prior to his visit.  Cleaster Corin, NT 02/01/2019 1:57 PM     FULL LENGTH CONSENT FOR TELE-HEALTH VISIT   I hereby voluntarily request, consent and authorize [facility] and its employed or contracted physicians, physician assistants, nurse practitioners or other licensed health care professionals (the Practitioner), to provide me with telemedicine health care services (the "Services") as deemed necessary by the treating Practitioner. I acknowledge and consent to receive the Services by the Practitioner via  telemedicine. I understand that the telemedicine visit will involve communicating with the Practitioner through live audiovisual communication technology and the disclosure of certain medical information by electronic transmission. I acknowledge that I have been given the opportunity to request an in-person assessment or other available alternative prior to the telemedicine visit and am voluntarily participating in the telemedicine visit.  I understand that I have the right to withhold or withdraw my consent to the use of telemedicine in the course of my care at any time, without affecting my right to future care or treatment, and that the Practitioner or I may terminate the telemedicine visit at any time. I understand that I have the right to inspect all information obtained and/or recorded in the course of the telemedicine visit and may receive copies of available information for a reasonable fee.  I understand that some of the potential risks of receiving the Services via telemedicine include:  Marland Kitchen Delay or interruption in medical evaluation due to technological equipment failure or disruption; . Information transmitted may not be sufficient (e.g. poor resolution of images) to allow for appropriate medical decision making by the Practitioner; and/or  . In rare instances, security protocols could fail, causing a breach of personal health information.  Furthermore, I acknowledge that it is my responsibility to provide information about my medical history, conditions and care that is complete and accurate to the best of my ability. I acknowledge that Practitioner's advice, recommendations, and/or decision may be based on factors not within their control, such as incomplete or inaccurate data provided by me or distortions of diagnostic images or specimens that may result from electronic transmissions. I understand that the practice of medicine is not an exact science and that Practitioner makes no warranties or  guarantees regarding treatment outcomes. I acknowledge that I will receive a copy of this consent concurrently upon execution via email to the email address I last provided but may also request a printed copy by calling the office of [facility].    I understand that my insurance will be billed for this visit.   I have read or had this consent read to me. . I understand the contents of this consent, which adequately explains the benefits and risks of the Services being provided via telemedicine.  . I have been provided ample opportunity to ask questions regarding this consent and the Services and have had my questions answered to my satisfaction. . I give my informed consent for the services to be provided through the use of telemedicine in my medical care  By participating in this telemedicine visit I agree to the above.

## 2019-02-05 ENCOUNTER — Other Ambulatory Visit: Payer: Self-pay

## 2019-02-05 ENCOUNTER — Ambulatory Visit (INDEPENDENT_AMBULATORY_CARE_PROVIDER_SITE_OTHER): Payer: Medicare Other | Admitting: Vascular Surgery

## 2019-02-05 ENCOUNTER — Encounter: Payer: Self-pay | Admitting: Vascular Surgery

## 2019-02-05 VITALS — Ht 68.0 in | Wt 170.0 lb

## 2019-02-05 DIAGNOSIS — I714 Abdominal aortic aneurysm, without rupture, unspecified: Secondary | ICD-10-CM

## 2019-02-05 NOTE — Progress Notes (Signed)
Virtual Visit via Video Note   I connected with [name] on 02/05/2019 using the [employer] virtual platform and verified that I was speaking with the correct person using two identifiers. Patient was located at his home and accompanied by his wife. I am located at [facility].   The limitations of evaluation and management by telemedicine and the availability of in person appointments have been previously discussed with the patient and are documented in the patients chart. The patient expressed understanding and consented to proceed.  We initiated the visit and video platform and had difficulty with audio reception therefore completed on telephone   PCP: Lawerance Cruel, MD   Chief Complaint: Abdominal aortic aneurysm  History of Present Illness: Aaron Hawkins is a 78 y.o. male with patient has a known abdominal aortic aneurysm and is being followed for potential expansion.  He has had no new difficulty with major medical problems since my last visit with him.  Specifically no cardiac difficulties.  He has no symptoms referable to his aneurysm.  Past Medical History:  Diagnosis Date  . Arthritis    "probably in my knees and right shoulder" (11/22/2017)  . Duodenal ulcer 1964  . Enlarged prostate without urinary obstruction   . History of Guillain-Barre syndrome 2003  . History of hydronephrosis    W/ STRICTURE - not aware of this  . History of kidney stones    "passed it"  . History of melanoma excision    BACK-- 04-04-2007 W/ SLN BX RIGHT AXILL, right arm 2017  . History of nephrolithiasis    right kidney  . Microscopic hematuria    history of  . PONV (postoperative nausea and vomiting)    w/hip replacement  . Prostate cancer ([place]) 05/30/13   gleason 3+3=6, volume 58 cc  . Skin cancer 04/04/2007; 2017   "back, right arm"    Past Surgical History:  Procedure Laterality Date  . CATARACT EXTRACTION W/ INTRAOCULAR LENS  IMPLANT, BILATERAL Bilateral   . HERNIA  REPAIR  5638'V   w/mesh, umbilical  . INGUINAL HERNIA REPAIR Bilateral 1990's  . JOINT REPLACEMENT    . MELANOMA EXCISION  04/04/2007; 2017   back W/ SLN BX RIGHT AXILL; right arm  . PROSTATE BIOPSY  01/02/13   benign, one small focus of atypical cells right core  . PROSTATE BIOPSY  05/30/13   Gleason 3+3=6, volume 49 cc  . RADIOACTIVE SEED IMPLANT N/A 08/08/2013   Procedure: RADIOACTIVE SEED IMPLANT;  Surgeon: Molli Hazard, MD;  Location: [facility];  Service: Urology;  Laterality: N/A;   6 SEED IMPLANTED NO SEEDS FOUND IN BLADDER  . TONSILLECTOMY AND ADENOIDECTOMY  childhood  . TOTAL HIP ARTHROPLASTY Right 01-06-2009  . TOTAL SHOULDER ARTHROPLASTY Right 11/22/2017  . TOTAL SHOULDER ARTHROPLASTY Right 11/22/2017   Procedure: TOTAL SHOULDER ARTHROPLASTY;  Surgeon: Hiram Gash, MD;  Location: [facility];  Service: Orthopedics;  Laterality: Right;    Current Meds  Medication Sig  . acetaminophen (TYLENOL) 500 MG tablet Take 500 mg by mouth every 6 (six) hours as needed.  . Multiple Vitamins-Minerals (PRESERVISION AREDS 2) CAPS Take 1 capsule by mouth 2 (two) times daily.    12 system ROS was negative unless otherwise noted in HPI  Observations/Objective: I did review his images from his CT scan and discussed these with the patient.  This shows maximal diameter of his infrarenal aorta at 5.1 cm.  This is increased in size from a maximal  diameter of 4.2 in February 2018.  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  The interpreting radiologist feels that a area of cyst in the left upper pole is of indeterminant densities and feels that MRI may be appropriate for better evaluation of this cystic structure.  Assessment and Plan: Had long discussion with the patient regarding his aneurysm.  He has grown 9 mm in 30 months.  I explained that this is typical and expected growth.  I did explain that  at his age and health the threshold for elective repair would be approximately 5.5 cm.  He has not shown rapid growth but slow continued growth.  He also has iliac artery aneurysms.  He does appear as though he would be a candidate for stent graft repair if he had an indication for surgery.  I have recommended that we see him in our office in 6 months with CT scan of his abdomen and pelvis.   He does have follow-up with Dr. Louis Meckel for urology for prostate issues.  I have asked that he contact his office for further recommendations regarding the cystic lesion in his  upper pole of his left kidney   Follow Up Instructions:  6 months with CT scan abdomen and pelvis   I discussed the assessment and treatment plan with the patient. The patient was provided an opportunity to ask questions and all were answered. The patient agreed with the plan and demonstrated an understanding of the instructions.   The patient was advised to call back or seek an in-person evaluation if the symptoms worsen or if the condition fails to improve as anticipated.     Annamary Rummage Vascular and Vein Specialists of [place] Office: [phone]  02/05/2019, 12:46 PM

## 2019-03-05 DIAGNOSIS — D225 Melanocytic nevi of trunk: Secondary | ICD-10-CM | POA: Diagnosis not present

## 2019-03-05 DIAGNOSIS — L821 Other seborrheic keratosis: Secondary | ICD-10-CM | POA: Diagnosis not present

## 2019-03-05 DIAGNOSIS — D2271 Melanocytic nevi of right lower limb, including hip: Secondary | ICD-10-CM | POA: Diagnosis not present

## 2019-03-05 DIAGNOSIS — D2272 Melanocytic nevi of left lower limb, including hip: Secondary | ICD-10-CM | POA: Diagnosis not present

## 2019-03-05 DIAGNOSIS — Z85820 Personal history of malignant melanoma of skin: Secondary | ICD-10-CM | POA: Diagnosis not present

## 2019-03-05 DIAGNOSIS — L812 Freckles: Secondary | ICD-10-CM | POA: Diagnosis not present

## 2019-03-05 DIAGNOSIS — D1801 Hemangioma of skin and subcutaneous tissue: Secondary | ICD-10-CM | POA: Diagnosis not present

## 2019-07-25 ENCOUNTER — Other Ambulatory Visit: Payer: Self-pay

## 2019-07-25 DIAGNOSIS — I714 Abdominal aortic aneurysm, without rupture, unspecified: Secondary | ICD-10-CM

## 2019-08-07 ENCOUNTER — Ambulatory Visit
Admission: RE | Admit: 2019-08-07 | Discharge: 2019-08-07 | Disposition: A | Discharge status: Home / Self Care | Payer: Medicare Other | Source: Ambulatory Visit | Attending: Vascular Surgery | Admitting: Vascular Surgery

## 2019-08-07 DIAGNOSIS — I714 Abdominal aortic aneurysm, without rupture, unspecified: Secondary | ICD-10-CM

## 2019-08-07 MED ORDER — IOPAMIDOL (ISOVUE-370) INJECTION 76%
75.0000 mL | Freq: Once | INTRAVENOUS | Status: AC | PRN
  Administered 2019-08-07: 75 mL via INTRAVENOUS

## 2019-08-12 ENCOUNTER — Telehealth (HOSPITAL_COMMUNITY): Payer: Self-pay

## 2019-08-12 NOTE — Telephone Encounter (Signed)

## 2019-08-13 ENCOUNTER — Encounter: Payer: Self-pay | Admitting: Vascular Surgery

## 2019-08-13 ENCOUNTER — Ambulatory Visit (INDEPENDENT_AMBULATORY_CARE_PROVIDER_SITE_OTHER): Payer: Medicare Other | Admitting: Vascular Surgery

## 2019-08-13 ENCOUNTER — Other Ambulatory Visit: Payer: Self-pay

## 2019-08-13 VITALS — BP 171/89 | HR 70 | Temp 98.2°F | Resp 20 | Ht 68.0 in | Wt 183.9 lb

## 2019-08-13 DIAGNOSIS — I714 Abdominal aortic aneurysm, without rupture, unspecified: Secondary | ICD-10-CM

## 2019-08-13 NOTE — Progress Notes (Signed)
Vascular and Vein Specialist of [facility]  Patient name: Aaron Hawkins MRN: [id number] DOB: [birth year] Sex: male  REASON FOR VISIT: Continued follow-up of known abdominal aortic aneurysm  HPI: Aaron Hawkins is a 79 y.o. male here today for follow-up.  Looks quite good today.  Appears younger than his stated age of 67.  Remains very active and is making the best of restrictions regarding Covid.  He has no new medical difficulties and no symptoms associated with his aneurysm  Past Medical History:  Diagnosis Date  . Arthritis    "probably in my knees and right shoulder" (11/22/2017)  . Duodenal ulcer 1964  . Enlarged prostate without urinary obstruction   . History of Guillain-Barre syndrome 2003  . History of hydronephrosis    W/ STRICTURE - not aware of this  . History of kidney stones    "passed it"  . History of melanoma excision    BACK-- 04-04-2007 W/ SLN BX RIGHT AXILL, right arm 2017  . History of nephrolithiasis    right kidney  . Microscopic hematuria    history of  . PONV (postoperative nausea and vomiting)    w/hip replacement  . Prostate cancer ([place]) 05/30/13   gleason 3+3=6, volume 58 cc  . Skin cancer 04/04/2007; 2017   "back, right arm"    Family History  Problem Relation Age of Onset  . Cancer Father        prostate  . Cancer Brother        prostate    SOCIAL HISTORY: Social History   Tobacco Use  . Smoking status: Former Smoker    Packs/day: 1.00    Years: 7.00    Pack years: 7.00    Types: Cigarettes    Quit date: 06/18/1966    Years since quitting: 53.1  . Smokeless tobacco: Never Used  Substance Use Topics  . Alcohol use: Yes    Alcohol/week: 2.0 standard drinks    Types: 2 Glasses of wine per week    No Known Allergies  Current Outpatient Medications  Medication Sig Dispense Refill  . acetaminophen (TYLENOL) 500 MG tablet Take 500 mg by mouth every 6 (six) hours as needed.    . diclofenac  sodium (VOLTAREN) 1 % GEL Apply 1 application topically daily as needed (joint pain).    Marland Kitchen lidocaine (LIDODERM) 5 % Place 1 patch onto the skin daily. Remove & Discard patch within 12 hours or as directed by MD    . Multiple Vitamins-Minerals (PRESERVISION AREDS 2) CAPS Take 1 capsule by mouth 2 (two) times daily.    . Polyvinyl Alcohol-Povidone (REFRESH OP) Place 2 drops into both eyes daily as needed (dry eyes).    . tamsulosin (FLOMAX) 0.4 MG CAPS capsule Take 2 capsules (0.8 mg total) by mouth daily. (Patient not taking: Reported on 11/07/2017) 60 capsule 11   No current facility-administered medications for this visit.    REVIEW OF SYSTEMS:  [X]  denotes positive finding, [ ]  denotes negative finding Cardiac  Comments:  Chest pain or chest pressure:    Shortness of breath upon exertion:    Short of breath when lying flat:    Irregular heart rhythm:        Vascular    Pain in calf, thigh, or hip brought on by ambulation:    Pain in feet at night that wakes you up from your sleep:     Blood clot in your veins:    Leg swelling:  PHYSICAL EXAM: Vitals:   08/13/19 1001  BP: (!) 171/89  Pulse: 70  Resp: 20  Temp: 98.2 F (36.8 C)  SpO2: 100%  Weight: 183 lb 14.4 oz (83.4 kg)  Height: 5\' 8"  (1.727 m)    GENERAL: The patient is a well-nourished male, in no acute distress. The vital signs are documented above. CARDIOVASCULAR: 2+ radial pulses bilaterally.  Easily palpable abdominal aortic aneurysm which is nontender.  2+ femoral pulses and 2+ dorsalis pedis pulses. PULMONARY: There is good air exchange  MUSCULOSKELETAL: There are no major deformities or cyanosis. NEUROLOGIC: No focal weakness or paresthesias are detected. SKIN: There are no ulcers or rashes noted. PSYCHIATRIC: The patient has a normal affect.  DATA:  CT scan today reveals maximal diameter 5.4 cm up from 5.1 cm 6 months ago.  He does appear to have adequate infrarenal neck for stent grafting.  Does  have some internal iliac artery aneurysms that would require dressing as well  MEDICAL ISSUES: Had a long discussion with patient regarding options.  He has had slightly more rapid growth than anticipated over the last 6 months.  He certainly is at the threshold for elective repair.  He will discuss this with his wife but is leaning towards elective repair once elective surgery is resumed.  We will coordinate this with him.    [name], MD FACS Vascular and Vein Specialists of [facility] Tel [phone] Pager [phone]

## 2019-08-14 ENCOUNTER — Other Ambulatory Visit: Payer: Self-pay | Admitting: *Deleted

## 2019-08-14 NOTE — Progress Notes (Signed)
Patient requested any date for surgery after 09/01/2019. Instructed to be at [facility] admitting at 8:00 am or as directed by the hospital on 09/06/2019 for surgery with Dr. [name]. NPO past MN night prior. Expect a call and follow the detailed surgery instructions received from the hospital pre-admission department. Verbalized understanding. To call this office if questions.

## 2019-08-29 ENCOUNTER — Encounter: Payer: Self-pay | Admitting: Vascular Surgery

## 2019-09-02 NOTE — Progress Notes (Signed)
[id number] - [place], [address] AT [address] [place] [address] Phone: [phone] Fax: [phone]      Your procedure is scheduled on Wednesday, September 11, 2019.  Report to [facility] Main Entrance "A" at 8:00 A.M., and check in at the Admitting office.  Call this number if you have problems the morning of surgery:  [phone]  Call [phone] if you have any questions prior to your surgery date Monday-Friday 8am-4pm    Remember:  Do not eat or drink after midnight the night before your surgery    Take these medicines the morning of surgery with A SIP OF WATER:  acetaminophen (TYLENOL) - if needed Polyvinyl Alcohol-Povidone (REFRESH OP) - if needed  7 days prior to surgery STOP taking any Aspirin (unless otherwise instructed by your surgeon), Aleve, Naproxen, Ibuprofen, Motrin, Advil, Goody's, BC's, all herbal medications, fish oil, and all vitamins.    The Morning of Surgery  Do not wear jewelry.  Do not wear lotions, powders, colognes, or deodorant  Men may shave face and neck.  Do not bring valuables to the hospital.  [facility] is not responsible for any belongings or valuables.  If you are a smoker, DO NOT Smoke 24 hours prior to surgery  If you wear a CPAP at night please bring your mask the morning of surgery   Remember that you must have someone to transport you home after your surgery, and remain with you for 24 hours if you are discharged the same day.   Please bring cases for contacts, glasses, hearing aids, dentures or bridgework because it cannot be worn into surgery.    Leave your suitcase in the car.  After surgery it may be brought to your room.  For patients admitted to the hospital, discharge time will be determined by your treatment team.  Patients discharged the day of surgery will not be allowed to drive home.    Special instructions:   [facility]- Preparing For Surgery  Before surgery, you can play an important role. Because skin is not sterile, your skin needs to be as free of germs as possible. You can reduce the number of germs on your skin by washing with CHG (chlorahexidine gluconate) Soap before surgery.  CHG is an antiseptic cleaner which kills germs and bonds with the skin to continue killing germs even after washing.    Oral Hygiene is also important to reduce your risk of infection.  Remember - BRUSH YOUR TEETH THE MORNING OF SURGERY WITH YOUR REGULAR TOOTHPASTE  Please do not use if you have an allergy to CHG or antibacterial soaps. If your skin becomes reddened/irritated stop using the CHG.  Do not shave (including legs and underarms) for at least 48 hours prior to first CHG shower. It is OK to shave your face.  Please follow these instructions carefully.   1. Shower the NIGHT BEFORE SURGERY and the MORNING OF SURGERY with CHG Soap.   2. If you chose to wash your hair, wash your hair first as usual with your normal shampoo.  3. After you shampoo, rinse your hair and body thoroughly to remove the shampoo.  4. Use CHG as you would any other liquid soap. You can apply CHG directly to the skin and wash gently with a scrungie or a clean washcloth.   5. Apply the CHG Soap to your body ONLY FROM THE NECK DOWN.  Do not  use on open wounds or open sores. Avoid contact with your eyes, ears, mouth and genitals (private parts). Wash Face and genitals (private parts)  with your normal soap.   6. Wash thoroughly, paying special attention to the area where your surgery will be performed.  7. Thoroughly rinse your body with warm water from the neck down.  8. DO NOT shower/wash with your normal soap after using and rinsing off the CHG Soap.  9. Pat yourself dry with a CLEAN TOWEL.  10. Wear CLEAN PAJAMAS to bed the night before surgery, wear comfortable clothes the morning of surgery  11. Place CLEAN SHEETS on your bed the  night of your first shower and DO NOT SLEEP WITH PETS.    Day of Surgery:  Please shower the morning of surgery with the CHG soap Do not apply any deodorants/lotions. Please wear clean clothes to the hospital/surgery center.   Remember to brush your teeth WITH YOUR REGULAR TOOTHPASTE.   Please read over the following fact sheets that you were given.

## 2019-09-03 ENCOUNTER — Other Ambulatory Visit: Payer: Self-pay

## 2019-09-03 ENCOUNTER — Encounter (HOSPITAL_COMMUNITY): Payer: Self-pay | Admitting: Vascular Surgery

## 2019-09-03 ENCOUNTER — Encounter (HOSPITAL_COMMUNITY)
Admission: RE | Admit: 2019-09-03 | Discharge: 2019-09-03 | Disposition: A | Discharge status: Home / Self Care | Payer: Medicare Other | Source: Ambulatory Visit | Attending: Vascular Surgery | Admitting: Vascular Surgery

## 2019-09-03 ENCOUNTER — Encounter (HOSPITAL_COMMUNITY): Payer: Self-pay

## 2019-09-03 ENCOUNTER — Other Ambulatory Visit (HOSPITAL_COMMUNITY): Payer: Medicare Other

## 2019-09-03 DIAGNOSIS — I491 Atrial premature depolarization: Secondary | ICD-10-CM | POA: Insufficient documentation

## 2019-09-03 DIAGNOSIS — Z01818 Encounter for other preprocedural examination: Secondary | ICD-10-CM | POA: Diagnosis not present

## 2019-09-03 DIAGNOSIS — I4510 Unspecified right bundle-branch block: Secondary | ICD-10-CM | POA: Insufficient documentation

## 2019-09-03 HISTORY — DX: Personal history of other diseases of the digestive system: Z87.19

## 2019-09-03 LAB — URINALYSIS, ROUTINE W REFLEX MICROSCOPIC
Bilirubin Urine: NEGATIVE
Glucose, UA: NEGATIVE mg/dL
Hgb urine dipstick: NEGATIVE
Ketones, ur: NEGATIVE mg/dL
Leukocytes,Ua: NEGATIVE
Nitrite: NEGATIVE
Protein, ur: NEGATIVE mg/dL
Specific Gravity, Urine: 1.026 (ref 1.005–1.030)
pH: 5 (ref 5.0–8.0)

## 2019-09-03 LAB — TYPE AND SCREEN
ABO/RH(D): A POS
Antibody Screen: NEGATIVE

## 2019-09-03 LAB — CBC
HCT: 29.5 % — ABNORMAL LOW (ref 39.0–52.0)
Hemoglobin: 7.4 g/dL — ABNORMAL LOW (ref 13.0–17.0)
MCH: 15.9 pg — ABNORMAL LOW (ref 26.0–34.0)
MCHC: 25.1 g/dL — ABNORMAL LOW (ref 30.0–36.0)
MCV: 63.4 fL — ABNORMAL LOW (ref 80.0–100.0)
Platelets: 340 10*3/uL (ref 150–400)
RBC: 4.65 MIL/uL (ref 4.22–5.81)
RDW: 19.9 % — ABNORMAL HIGH (ref 11.5–15.5)
WBC: 6 10*3/uL (ref 4.0–10.5)
nRBC: 0 % (ref 0.0–0.2)

## 2019-09-03 LAB — APTT: aPTT: 31 seconds (ref 24–36)

## 2019-09-03 LAB — SURGICAL PCR SCREEN
MRSA, PCR: NEGATIVE
Staphylococcus aureus: NEGATIVE

## 2019-09-03 LAB — COMPREHENSIVE METABOLIC PANEL
ALT: 11 U/L (ref 0–44)
AST: 15 U/L (ref 15–41)
Albumin: 3.7 g/dL (ref 3.5–5.0)
Alkaline Phosphatase: 76 U/L (ref 38–126)
Anion gap: 9 (ref 5–15)
BUN: 22 mg/dL (ref 8–23)
CO2: 27 mmol/L (ref 22–32)
Calcium: 9.1 mg/dL (ref 8.9–10.3)
Chloride: 103 mmol/L (ref 98–111)
Creatinine, Ser: 1.07 mg/dL (ref 0.61–1.24)
GFR calc Af Amer: >60 mL/min (ref >60)
GFR calc non Af Amer: >60 mL/min (ref >60)
Glucose, Bld: 105 mg/dL — ABNORMAL HIGH (ref 70–99)
Potassium: 3.8 mmol/L (ref 3.5–5.1)
Sodium: 139 mmol/L (ref 135–145)
Total Bilirubin: 0.7 mg/dL (ref 0.3–1.2)
Total Protein: 6.6 g/dL (ref 6.5–8.1)

## 2019-09-03 LAB — PROTIME-INR
INR: 1 (ref 0.8–1.2)
Prothrombin Time: 13.5 seconds (ref 11.4–15.2)

## 2019-09-03 LAB — ABO/RH: ABO/RH(D): A POS

## 2019-09-03 NOTE — Progress Notes (Signed)
PCP - Dr. Lawerance Cruel Cardiologist - denies  PPM/ICD - denies  Chest x-ray - N/A EKG - 09/03/2019 Stress Test - denies ECHO - denies Cardiac Cath - denies   Sleep Study - deneis CPAP - N/A  Blood Thinner Instructions: N/A Aspirin Instructions:N/A  ERAS Protcol - No  COVID TEST- Scheduled for Monday, 2/22/221. Patient verbalized understanding of self-quarantine instructions, appointment time and place.  Anesthesia review: YES, abnormal EKG  Patient denies shortness of breath, fever, cough and chest pain at PAT appointment  All instructions explained to the patient, with a verbal understanding of the material. Patient agrees to go over the instructions while at home for a better understanding. Patient also instructed to self quarantine after being tested for COVID-19. The opportunity to ask questions was provided.

## 2019-09-04 DIAGNOSIS — K269 Duodenal ulcer, unspecified as acute or chronic, without hemorrhage or perforation: Secondary | ICD-10-CM | POA: Diagnosis not present

## 2019-09-04 DIAGNOSIS — Z79899 Other long term (current) drug therapy: Secondary | ICD-10-CM | POA: Diagnosis not present

## 2019-09-04 DIAGNOSIS — I714 Abdominal aortic aneurysm, without rupture: Secondary | ICD-10-CM | POA: Diagnosis not present

## 2019-09-04 DIAGNOSIS — D649 Anemia, unspecified: Secondary | ICD-10-CM | POA: Diagnosis not present

## 2019-09-04 NOTE — Progress Notes (Signed)
Anesthesia Chart Review:  New anemia noted on preop labs. Hgb 7.4. Last labs in Epic from 10/2017 show Hgb 16.7. Result was called to Dr. Luther Parody office and per scheduler in his office, he is consulting with another of pt's providers regarding management.   Remainder of preop labs unremarkable.  EKG 09/03/19: Sinus rhythm with Premature atrial complexes. Rate 76. Incomplete right bundle branch block.   Wynonia Musty [facility] Short Stay Center/Anesthesiology Phone [phone] 09/04/2019 1:52 PM

## 2019-09-06 ENCOUNTER — Telehealth: Payer: Self-pay

## 2019-09-06 DIAGNOSIS — D649 Anemia, unspecified: Secondary | ICD-10-CM | POA: Diagnosis not present

## 2019-09-06 NOTE — Telephone Encounter (Signed)
Pt called regarding his low Hgb and his upcoming surgery next week. We let him know that Dr. [name] is aware and that as soon as we get any further information, we will contact him. I moved his Covid screening from Monday to Tuesday. Pt is aware.

## 2019-09-09 ENCOUNTER — Other Ambulatory Visit (HOSPITAL_COMMUNITY): Payer: Medicare Other

## 2019-09-10 ENCOUNTER — Inpatient Hospital Stay (HOSPITAL_COMMUNITY): Admission: RE | Admit: 2019-09-10 | Payer: Medicare Other | Source: Ambulatory Visit

## 2019-09-11 ENCOUNTER — Encounter (HOSPITAL_COMMUNITY): Admission: RE | Payer: Self-pay | Source: Home / Self Care

## 2019-09-11 ENCOUNTER — Inpatient Hospital Stay (HOSPITAL_COMMUNITY): Admission: RE | Admit: 2019-09-11 | Payer: Medicare Other | Source: Home / Self Care | Admitting: Vascular Surgery

## 2019-09-11 SURGERY — INSERTION, ENDOVASCULAR STENT GRAFT, AORTA, ABDOMINAL
Anesthesia: Choice

## 2019-09-17 DIAGNOSIS — D509 Iron deficiency anemia, unspecified: Secondary | ICD-10-CM | POA: Diagnosis not present

## 2019-09-17 DIAGNOSIS — D500 Iron deficiency anemia secondary to blood loss (chronic): Secondary | ICD-10-CM | POA: Diagnosis not present

## 2019-09-26 ENCOUNTER — Other Ambulatory Visit (HOSPITAL_COMMUNITY): Payer: Self-pay

## 2019-09-27 ENCOUNTER — Other Ambulatory Visit: Payer: Self-pay

## 2019-09-27 ENCOUNTER — Encounter (HOSPITAL_COMMUNITY)
Admission: RE | Admit: 2019-09-27 | Discharge: 2019-09-27 | Disposition: A | Discharge status: Home / Self Care | Payer: Medicare Other | Source: Ambulatory Visit | Attending: Family Medicine | Admitting: Family Medicine

## 2019-09-27 DIAGNOSIS — K922 Gastrointestinal hemorrhage, unspecified: Secondary | ICD-10-CM | POA: Diagnosis not present

## 2019-09-27 DIAGNOSIS — D509 Iron deficiency anemia, unspecified: Secondary | ICD-10-CM | POA: Diagnosis not present

## 2019-09-27 MED ORDER — SODIUM CHLORIDE 0.9 % IV SOLN
510.0000 mg | INTRAVENOUS | Status: DC
  Administered 2019-09-27: 510 mg via INTRAVENOUS
  Filled 2019-09-27: qty 17

## 2019-09-27 NOTE — Discharge Instructions (Signed)

## 2019-10-04 ENCOUNTER — Other Ambulatory Visit: Payer: Self-pay

## 2019-10-04 ENCOUNTER — Encounter (HOSPITAL_COMMUNITY)
Admission: RE | Admit: 2019-10-04 | Discharge: 2019-10-04 | Disposition: A | Discharge status: Home / Self Care | Payer: Medicare Other | Source: Ambulatory Visit | Attending: Cardiovascular Disease | Admitting: Cardiovascular Disease

## 2019-10-04 DIAGNOSIS — K922 Gastrointestinal hemorrhage, unspecified: Secondary | ICD-10-CM | POA: Diagnosis not present

## 2019-10-04 MED ORDER — SODIUM CHLORIDE 0.9 % IV SOLN
510.0000 mg | INTRAVENOUS | Status: DC
  Administered 2019-10-04: 510 mg via INTRAVENOUS
  Filled 2019-10-04: qty 17

## 2019-10-07 DIAGNOSIS — K922 Gastrointestinal hemorrhage, unspecified: Secondary | ICD-10-CM | POA: Diagnosis not present

## 2019-10-07 DIAGNOSIS — D62 Acute posthemorrhagic anemia: Secondary | ICD-10-CM | POA: Diagnosis not present

## 2019-10-11 DIAGNOSIS — Z1159 Encounter for screening for other viral diseases: Secondary | ICD-10-CM | POA: Diagnosis not present

## 2019-10-15 DIAGNOSIS — D509 Iron deficiency anemia, unspecified: Secondary | ICD-10-CM | POA: Diagnosis not present

## 2019-10-15 DIAGNOSIS — K625 Hemorrhage of anus and rectum: Secondary | ICD-10-CM | POA: Diagnosis not present

## 2019-10-16 ENCOUNTER — Encounter: Payer: Self-pay | Admitting: Vascular Surgery

## 2019-10-16 DIAGNOSIS — D490 Neoplasm of unspecified behavior of digestive system: Secondary | ICD-10-CM | POA: Diagnosis not present

## 2019-10-16 DIAGNOSIS — D509 Iron deficiency anemia, unspecified: Secondary | ICD-10-CM | POA: Diagnosis not present

## 2019-10-16 DIAGNOSIS — K2930 Chronic superficial gastritis without bleeding: Secondary | ICD-10-CM | POA: Diagnosis not present

## 2019-10-16 DIAGNOSIS — D500 Iron deficiency anemia secondary to blood loss (chronic): Secondary | ICD-10-CM | POA: Diagnosis not present

## 2019-10-16 DIAGNOSIS — C183 Malignant neoplasm of hepatic flexure: Secondary | ICD-10-CM | POA: Diagnosis not present

## 2019-10-18 DIAGNOSIS — K2930 Chronic superficial gastritis without bleeding: Secondary | ICD-10-CM | POA: Diagnosis not present

## 2019-10-18 DIAGNOSIS — D509 Iron deficiency anemia, unspecified: Secondary | ICD-10-CM | POA: Diagnosis not present

## 2019-10-18 DIAGNOSIS — C183 Malignant neoplasm of hepatic flexure: Secondary | ICD-10-CM | POA: Diagnosis not present

## 2019-10-22 ENCOUNTER — Other Ambulatory Visit (HOSPITAL_COMMUNITY): Payer: Self-pay | Admitting: Gastroenterology

## 2019-10-22 ENCOUNTER — Other Ambulatory Visit: Payer: Self-pay | Admitting: Gastroenterology

## 2019-10-22 DIAGNOSIS — C183 Malignant neoplasm of hepatic flexure: Secondary | ICD-10-CM

## 2019-10-31 ENCOUNTER — Other Ambulatory Visit: Payer: Self-pay

## 2019-10-31 ENCOUNTER — Ambulatory Visit (HOSPITAL_COMMUNITY)
Admission: RE | Admit: 2019-10-31 | Discharge: 2019-10-31 | Disposition: A | Discharge status: Home / Self Care | Payer: Medicare Other | Source: Ambulatory Visit | Attending: Gastroenterology | Admitting: Gastroenterology

## 2019-10-31 DIAGNOSIS — C183 Malignant neoplasm of hepatic flexure: Secondary | ICD-10-CM | POA: Diagnosis not present

## 2019-10-31 DIAGNOSIS — I714 Abdominal aortic aneurysm, without rupture: Secondary | ICD-10-CM | POA: Diagnosis not present

## 2019-10-31 MED ORDER — IOHEXOL 300 MG/ML  SOLN
100.0000 mL | Freq: Once | INTRAMUSCULAR | Status: AC | PRN
  Administered 2019-10-31: 100 mL via INTRAVENOUS

## 2019-10-31 MED ORDER — SODIUM CHLORIDE (PF) 0.9 % IJ SOLN
INTRAMUSCULAR | Status: AC
  Filled 2019-10-31: qty 50

## 2019-11-08 ENCOUNTER — Ambulatory Visit: Payer: Self-pay | Admitting: General Surgery

## 2019-11-08 ENCOUNTER — Other Ambulatory Visit: Payer: Medicare Other

## 2019-11-08 DIAGNOSIS — C184 Malignant neoplasm of transverse colon: Secondary | ICD-10-CM | POA: Diagnosis not present

## 2019-11-08 NOTE — H&P (View-Only) (Signed)
The patient is a 79 year old male who presents with colorectal cancer. 79 year old male who presents to the office with a newly diagnosed colon cancer. He was undergoing a workup for endovascular abdominal aortic aneurysm repair by Dr. [name] in February. He was noted to have anemia on his preop lab work. This prompted a colonoscopy, and 2 iron infusions. He states that his most recent hemoglobin was in the 9 range. This was drawn prior to his colonoscopy. He was noted to have a hepatic flexure mass which was biopsied. Pathology shows adenocarcinoma. CT scans of the chest, abdomen and pelvis show no signs of metastatic disease. CEA level was normal. Patient does not have any abdominal surgery history except for repair of an epigastric hernia and inguinal hernias. He is not a smoker. He has no cardiac history. Last CT scan performed in January shows abdominal aortic aneurysm proximally 5.4 cm in diameter.   Past Surgical History Sabino Gasser, CMA; 11/08/2019 2:11 PM) Colon Polyp Removal - Colonoscopy Hip Surgery Right. Open Inguinal Hernia Surgery Bilateral. Prostate Surgery - Removal Shoulder Surgery Right.  Diagnostic Studies History Sabino Gasser, [place]; 11/08/2019 2:11 PM) Colonoscopy within last year  Allergies Sabino Gasser, CMA; 11/08/2019 2:11 PM) No Known Drug Allergies [11/08/2019]: Allergies Reconciled  Medication History Sabino Gasser, [place]; 11/08/2019 2:12 PM) No Current Medications Medications Reconciled  Social History Sabino Gasser, [place]; 11/08/2019 2:11 PM) Alcohol use Recently quit alcohol use. Caffeine use Carbonated beverages, Coffee, Tea. No drug use Tobacco use Former smoker.  Family History Sabino Gasser, [place]; 11/08/2019 2:11 PM) Breast Cancer Sister. Prostate Cancer Brother.  Other Problems Sabino Gasser, CMA; 11/08/2019 2:11 PM) Arthritis Colon Cancer Enlarged Prostate Gastric Ulcer Inguinal Hernia Melanoma Prostate  Cancer     Review of Systems Sabino Gasser CMA; 11/08/2019 2:11 PM) General Not Present- Appetite Loss, Chills, Fatigue, Fever, Night Sweats, Weight Gain and Weight Loss. Skin Not Present- Change in Wart/Mole, Dryness, Hives, Jaundice, New Lesions, Non-Healing Wounds, Rash and Ulcer. HEENT Present- Wears glasses/contact lenses. Not Present- Earache, Hearing Loss, Hoarseness, Nose Bleed, Oral Ulcers, Ringing in the Ears, Seasonal Allergies, Sinus Pain, Sore Throat, Visual Disturbances and Yellow Eyes. Respiratory Not Present- Bloody sputum, Chronic Cough, Difficulty Breathing, Snoring and Wheezing. Cardiovascular Not Present- Chest Pain, Difficulty Breathing Lying Down, Leg Cramps, Palpitations, Rapid Heart Rate, Shortness of Breath and Swelling of Extremities. Gastrointestinal Not Present- Abdominal Pain, Bloating, Bloody Stool, Change in Bowel Habits, Chronic diarrhea, Constipation, Difficulty Swallowing, Excessive gas, Gets full quickly at meals, Hemorrhoids, Indigestion, Nausea, Rectal Pain and Vomiting. Male Genitourinary Present- Frequency. Not Present- Blood in Urine, Change in Urinary Stream, Impotence, Nocturia, Painful Urination, Urgency and Urine Leakage. Musculoskeletal Not Present- Back Pain, Joint Pain, Joint Stiffness, Muscle Pain, Muscle Weakness and Swelling of Extremities. Neurological Not Present- Decreased Memory, Fainting, Headaches, Numbness, Seizures, Tingling, Tremor, Trouble walking and Weakness. Psychiatric Not Present- Anxiety, Bipolar, Change in Sleep Pattern, Depression, Fearful and Frequent crying. Endocrine Not Present- Cold Intolerance, Excessive Hunger, Hair Changes, Heat Intolerance, Hot flashes and New Diabetes. Hematology Not Present- Blood Thinners, Easy Bruising, Excessive bleeding, Gland problems, HIV and Persistent Infections.  Vitals Sabino Gasser CMA; 11/08/2019 2:12 PM) 11/08/2019 2:12 PM Weight: 169.5 lb Height: 68in Body Surface Area: 1.91 m  Body Mass Index: 25.77 kg/m  Temp.: 97.3F(Tympanic)  Pulse: 93 (Regular)  BP: 132/84(Sitting, Left Arm, Standard)        Physical Exam Leighton Ruff MD; [id number] 2:37 PM)  General Mental Status-Alert. General Appearance-Not in acute distress. Build & Nutrition-Well nourished.  Posture-Normal posture. Gait-Normal.  Head and Neck Head-normocephalic, atraumatic with no lesions or palpable masses. Trachea-midline.  Chest and Lung Exam Chest and lung exam reveals -on auscultation, normal breath sounds, no adventitious sounds and normal vocal resonance.  Cardiovascular Cardiovascular examination reveals -normal heart sounds, regular rate and rhythm with no murmurs and no digital clubbing, cyanosis, edema, increased warmth or tenderness.  Abdomen Inspection Inspection of the abdomen reveals - No Hernias. Skin - Scar - Epigastrium. Palpation/Percussion Palpation and Percussion of the abdomen reveal - Soft, Non Tender, No Rigidity (guarding) and No hepatosplenomegaly. Abdominal Mass Palpable - Location - Periumbilical(Pulsatile).  Neurologic Neurologic evaluation reveals -alert and oriented x 3 with no impairment of recent or remote memory, normal attention span and ability to concentrate, normal sensation and normal coordination.  Musculoskeletal Normal Exam - Bilateral-Upper Extremity Strength Normal and Lower Extremity Strength Normal.    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 Patient was undergoing workup for elective endovascular abdominal aortic aneurysm repair. He was noted to have anemia. His surgery was canceled and he was sent for evaluation with colonoscopy. This showed a mass that is hepatic flexure. Biopsy show adenocarcinoma. CT scans show no obvious signs of metastatic disease.  CEA was normal. I have recommended a right hemicolectomy. We have discussed this in detail including risk and benefits of various surgical approaches. We will try to get this scheduled as soon as possible. The surgery and anatomy were described to the patient as well as the risks of surgery and the possible complications. These include: Bleeding, deep abdominal infections and possible wound complications such as hernia and infection, damage to adjacent structures, leak of surgical connections, which can lead to other surgeries and possibly an ostomy, possible need for other procedures, such as abscess drains in radiology, possible prolonged hospital stay, possible diarrhea from removal of part of the colon, possible constipation from narcotics, possible bowel, bladder or sexual dysfunction if having rectal surgery, prolonged fatigue/weakness or appetite loss, possible early recurrence of of disease, possible complications of their medical problems such as heart disease or arrhythmias or lung problems, death (less than 1%). I believe the patient understands and wishes to proceed with the surgery.

## 2019-11-08 NOTE — H&P (Signed)
The patient is a 79 year old male who presents with colorectal cancer. 78 year old male who presents to the office with a newly diagnosed colon cancer. He was undergoing a workup for endovascular abdominal aortic aneurysm repair by Dr. [name] in February. He was noted to have anemia on his preop lab work. This prompted a colonoscopy, and 2 iron infusions. He states that his most recent hemoglobin was in the 9 range. This was drawn prior to his colonoscopy. He was noted to have a hepatic flexure mass which was biopsied. Pathology shows adenocarcinoma. CT scans of the chest, abdomen and pelvis show no signs of metastatic disease. CEA level was normal. Patient does not have any abdominal surgery history except for repair of an epigastric hernia and inguinal hernias. He is not a smoker. He has no cardiac history. Last CT scan performed in January shows abdominal aortic aneurysm proximally 5.4 cm in diameter.   Past Surgical History Sabino Gasser, CMA; 11/08/2019 2:11 PM) Colon Polyp Removal - Colonoscopy Hip Surgery Right. Open Inguinal Hernia Surgery Bilateral. Prostate Surgery - Removal Shoulder Surgery Right.  Diagnostic Studies History Sabino Gasser, [place]; 11/08/2019 2:11 PM) Colonoscopy within last year  Allergies Sabino Gasser, CMA; 11/08/2019 2:11 PM) No Known Drug Allergies [11/08/2019]: Allergies Reconciled  Medication History Sabino Gasser, [place]; 11/08/2019 2:12 PM) No Current Medications Medications Reconciled  Social History Sabino Gasser, [place]; 11/08/2019 2:11 PM) Alcohol use Recently quit alcohol use. Caffeine use Carbonated beverages, Coffee, Tea. No drug use Tobacco use Former smoker.  Family History Sabino Gasser, [place]; 11/08/2019 2:11 PM) Breast Cancer Sister. Prostate Cancer Brother.  Other Problems Sabino Gasser, CMA; 11/08/2019 2:11 PM) Arthritis Colon Cancer Enlarged Prostate Gastric Ulcer Inguinal Hernia Melanoma Prostate  Cancer     Review of Systems Sabino Gasser CMA; 11/08/2019 2:11 PM) General Not Present- Appetite Loss, Chills, Fatigue, Fever, Night Sweats, Weight Gain and Weight Loss. Skin Not Present- Change in Wart/Mole, Dryness, Hives, Jaundice, New Lesions, Non-Healing Wounds, Rash and Ulcer. HEENT Present- Wears glasses/contact lenses. Not Present- Earache, Hearing Loss, Hoarseness, Nose Bleed, Oral Ulcers, Ringing in the Ears, Seasonal Allergies, Sinus Pain, Sore Throat, Visual Disturbances and Yellow Eyes. Respiratory Not Present- Bloody sputum, Chronic Cough, Difficulty Breathing, Snoring and Wheezing. Cardiovascular Not Present- Chest Pain, Difficulty Breathing Lying Down, Leg Cramps, Palpitations, Rapid Heart Rate, Shortness of Breath and Swelling of Extremities. Gastrointestinal Not Present- Abdominal Pain, Bloating, Bloody Stool, Change in Bowel Habits, Chronic diarrhea, Constipation, Difficulty Swallowing, Excessive gas, Gets full quickly at meals, Hemorrhoids, Indigestion, Nausea, Rectal Pain and Vomiting. Male Genitourinary Present- Frequency. Not Present- Blood in Urine, Change in Urinary Stream, Impotence, Nocturia, Painful Urination, Urgency and Urine Leakage. Musculoskeletal Not Present- Back Pain, Joint Pain, Joint Stiffness, Muscle Pain, Muscle Weakness and Swelling of Extremities. Neurological Not Present- Decreased Memory, Fainting, Headaches, Numbness, Seizures, Tingling, Tremor, Trouble walking and Weakness. Psychiatric Not Present- Anxiety, Bipolar, Change in Sleep Pattern, Depression, Fearful and Frequent crying. Endocrine Not Present- Cold Intolerance, Excessive Hunger, Hair Changes, Heat Intolerance, Hot flashes and New Diabetes. Hematology Not Present- Blood Thinners, Easy Bruising, Excessive bleeding, Gland problems, HIV and Persistent Infections.  Vitals Sabino Gasser CMA; 11/08/2019 2:12 PM) 11/08/2019 2:12 PM Weight: 169.5 lb Height: 68in Body Surface Area: 1.91 m  Body Mass Index: 25.77 kg/m  Temp.: 97.34F(Tympanic)  Pulse: 93 (Regular)  BP: 132/84(Sitting, Left Arm, Standard)        Physical Exam Leighton Ruff MD; [id number] 2:37 PM)  General Mental Status-Alert. General Appearance-Not in acute distress. Build & Nutrition-Well nourished.  Posture-Normal posture. Gait-Normal.  Head and Neck Head-normocephalic, atraumatic with no lesions or palpable masses. Trachea-midline.  Chest and Lung Exam Chest and lung exam reveals -on auscultation, normal breath sounds, no adventitious sounds and normal vocal resonance.  Cardiovascular Cardiovascular examination reveals -normal heart sounds, regular rate and rhythm with no murmurs and no digital clubbing, cyanosis, edema, increased warmth or tenderness.  Abdomen Inspection Inspection of the abdomen reveals - No Hernias. Skin - Scar - Epigastrium. Palpation/Percussion Palpation and Percussion of the abdomen reveal - Soft, Non Tender, No Rigidity (guarding) and No hepatosplenomegaly. Abdominal Mass Palpable - Location - Periumbilical(Pulsatile).  Neurologic Neurologic evaluation reveals -alert and oriented x 3 with no impairment of recent or remote memory, normal attention span and ability to concentrate, normal sensation and normal coordination.  Musculoskeletal Normal Exam - Bilateral-Upper Extremity Strength Normal and Lower Extremity Strength Normal.    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 Patient was undergoing workup for elective endovascular abdominal aortic aneurysm repair. He was noted to have anemia. His surgery was canceled and he was sent for evaluation with colonoscopy. This showed a mass that is hepatic flexure. Biopsy show adenocarcinoma. CT scans show no obvious signs of metastatic disease.  CEA was normal. I have recommended a right hemicolectomy. We have discussed this in detail including risk and benefits of various surgical approaches. We will try to get this scheduled as soon as possible. The surgery and anatomy were described to the patient as well as the risks of surgery and the possible complications. These include: Bleeding, deep abdominal infections and possible wound complications such as hernia and infection, damage to adjacent structures, leak of surgical connections, which can lead to other surgeries and possibly an ostomy, possible need for other procedures, such as abscess drains in radiology, possible prolonged hospital stay, possible diarrhea from removal of part of the colon, possible constipation from narcotics, possible bowel, bladder or sexual dysfunction if having rectal surgery, prolonged fatigue/weakness or appetite loss, possible early recurrence of of disease, possible complications of their medical problems such as heart disease or arrhythmias or lung problems, death (less than 1%). I believe the patient understands and wishes to proceed with the surgery.

## 2019-11-19 NOTE — Patient Instructions (Addendum)
DUE TO COVID-19 ONLY ONE VISITOR IS ALLOWED TO COME WITH YOU AND STAY IN THE WAITING ROOM ONLY DURING PRE OP AND PROCEDURE DAY OF SURGERY. THE 1 VISITOR MAY VISIT WITH YOU AFTER SURGERY IN YOUR PRIVATE ROOM DURING VISITING HOURS ONLY!  YOU NEED TO HAVE A COVID 19 TEST ON 5-15- 21 @ 9:20 AM. THIS TEST MUST BE DONE BEFORE SURGERY, COME  [address], [place] , [address].  ([employer]) ONCE YOUR COVID TEST IS COMPLETED, PLEASE BEGIN THE QUARANTINE INSTRUCTIONS AS OUTLINED IN YOUR HANDOUT.                KALEO SWANTON  11/19/2019   Your procedure is scheduled on: 12-04-19   Report to [facility] Main  Entrance    Report to Admitting at 1:30 PM    Call this number if you have problems the morning of surgery [phone]   PLEASE CONSUME A CLEAR LIQUID DIET THE DAY OF YOUR PREP TO PREVENT DEHYDRATION    Remember: DRINK 2 PRESURGERY ENSURE DRINKS THE NIGHT BEFORE SURGERY AT 1000 PM AND 1 PRESURGERY DRINK THE DAY OF THE PROCEDURE 3 HOURS PRIOR TO SCHEDULED SURGERY. NOTHING BY MOUTH EXCEPT CLEAR LIQUIDS UNTIL THREE HOURS PRIOR TO SCHEDULED SURGERY. PLEASE FINISH PRESURGERY ENSURE DRINK PER SURGEON ORDER 3 HOURS PRIOR TO SCHEDULED SURGERY TIME WHICH NEEDS TO BE COMPLETED AT 12:30 PM.      CLEAR LIQUID DIET   Foods Allowed                                                                     Foods Excluded  Coffee and tea, regular and decaf                             liquids that you cannot  Plain Jell-O any favor except red or purple                                           see through such as: Fruit ices (not with fruit pulp)                                     milk, soups, orange juice  Iced Popsicles                                    All solid food Carbonated beverages, regular and diet                                    Cranberry, grape and apple juices Sports drinks like Gatorade Lightly seasoned clear broth or consume(fat free) Sugar, honey  syrup  Sample Menu Breakfast                                Lunch  Supper Cranberry juice                    Beef broth                            Chicken broth Jell-O                                     Grape juice                           Apple juice Coffee or tea                        Jell-O                                      Popsicle                                                Coffee or tea                        Coffee or tea  _____________________________________________________________________    Take these medicines the morning of surgery with A SIP OF WATER: None. You may use your eyedrops.  BRUSH YOUR TEETH MORNING OF SURGERY AND RINSE YOUR MOUTH OUT, NO CHEWING GUM CANDY OR MINTS.                                You may not have any metal on your body including hair pins and              piercings    Do not wear jewelry, cologne, lotions, powders or deodorant                          Men may shave face and neck.   Do not bring valuables to the hospital. [place].  Contacts, dentures or bridgework may not be worn into surgery.       Special Instructions: N/A             Please read over the following fact sheets you were given: _____________________________________________________________________             [facility] - Preparing for Surgery Before surgery, you can play an important role.  Because skin is not sterile, your skin needs to be as free of germs as possible.  You can reduce the number of germs on your skin by washing with CHG (chlorahexidine gluconate) soap before surgery.  CHG is an antiseptic cleaner which kills germs and bonds with the skin to continue killing germs even after washing. Please DO NOT use if you have an allergy to CHG or antibacterial soaps.  If your skin becomes reddened/irritated stop using the CHG and inform your nurse when you arrive at Short  Stay. Do not shave (including legs  and underarms) for at least 48 hours prior to the first CHG shower.  You may shave your face/neck. Please follow these instructions carefully:  1.  Shower with CHG Soap the night before surgery and the  morning of Surgery.  2.  If you choose to wash your hair, wash your hair first as usual with your  normal  shampoo.  3.  After you shampoo, rinse your hair and body thoroughly to remove the  shampoo.                           4.  Use CHG as you would any other liquid soap.  You can apply chg directly  to the skin and wash                       Gently with a scrungie or clean washcloth.  5.  Apply the CHG Soap to your body ONLY FROM THE NECK DOWN.   Do not use on face/ open                           Wound or open sores. Avoid contact with eyes, ears mouth and genitals (private parts).                       Wash face,  Genitals (private parts) with your normal soap.             6.  Wash thoroughly, paying special attention to the area where your surgery  will be performed.  7.  Thoroughly rinse your body with warm water from the neck down.  8.  DO NOT shower/wash with your normal soap after using and rinsing off  the CHG Soap.                9.  Pat yourself dry with a clean towel.            10.  Wear clean pajamas.            11.  Place clean sheets on your bed the night of your first shower and do not  sleep with pets. Day of Surgery : Do not apply any lotions/deodorants the morning of surgery.  Please wear clean clothes to the hospital/surgery center.  FAILURE TO FOLLOW THESE INSTRUCTIONS MAY RESULT IN THE CANCELLATION OF YOUR SURGERY PATIENT SIGNATURE_________________________________  NURSE SIGNATURE__________________________________  ________________________________________________________________________  WHAT IS A BLOOD TRANSFUSION? Blood Transfusion Information  A transfusion is the replacement of blood or some of its parts. Blood is made up of  multiple cells which provide different functions.  Red blood cells carry oxygen and are used for blood loss replacement.  White blood cells fight against infection.  Platelets control bleeding.  Plasma helps clot blood.  Other blood products are available for specialized needs, such as hemophilia or other clotting disorders. BEFORE THE TRANSFUSION  Who gives blood for transfusions?   Healthy volunteers who are fully evaluated to make sure their blood is safe. This is blood bank blood. Transfusion therapy is the safest it has ever been in the practice of medicine. Before blood is taken from a donor, a complete history is taken to make sure that person has no history of diseases nor engages in risky social behavior (examples are intravenous drug use or sexual activity with multiple partners). The donor's travel history is screened to minimize risk  of transmitting infections, such as malaria. The donated blood is tested for signs of infectious diseases, such as HIV and hepatitis. The blood is then tested to be sure it is compatible with you in order to minimize the chance of a transfusion reaction. If you or a relative donates blood, this is often done in anticipation of surgery and is not appropriate for emergency situations. It takes many days to process the donated blood. RISKS AND COMPLICATIONS Although transfusion therapy is very safe and saves many lives, the main dangers of transfusion include:   Getting an infectious disease.  Developing a transfusion reaction. This is an allergic reaction to something in the blood you were given. Every precaution is taken to prevent this. The decision to have a blood transfusion has been considered carefully by your caregiver before blood is given. Blood is not given unless the benefits outweigh the risks. AFTER THE TRANSFUSION  Right after receiving a blood transfusion, you will usually feel much better and more energetic. This is especially true if  your red blood cells have gotten low (anemic). The transfusion raises the level of the red blood cells which carry oxygen, and this usually causes an energy increase.  The nurse administering the transfusion will monitor you carefully for complications. HOME CARE INSTRUCTIONS  No special instructions are needed after a transfusion. You may find your energy is better. Speak with your caregiver about any limitations on activity for underlying diseases you may have. SEEK MEDICAL CARE IF:   Your condition is not improving after your transfusion.  You develop redness or irritation at the intravenous (IV) site. SEEK IMMEDIATE MEDICAL CARE IF:  Any of the following symptoms occur over the next 12 hours:  Shaking chills.  You have a temperature by mouth above 102 F (38.9 C), not controlled by medicine.  Chest, back, or muscle pain.  People around you feel you are not acting correctly or are confused.  Shortness of breath or difficulty breathing.  Dizziness and fainting.  You get a rash or develop hives.  You have a decrease in urine output.  Your urine turns a dark color or changes to pink, red, or brown. Any of the following symptoms occur over the next 10 days:  You have a temperature by mouth above 102 F (38.9 C), not controlled by medicine.  Shortness of breath.  Weakness after normal activity.  The white part of the eye turns yellow (jaundice).  You have a decrease in the amount of urine or are urinating less often.  Your urine turns a dark color or changes to pink, red, or brown. Document Released: 07/01/2000 Document Revised: 09/26/2011 Document Reviewed: 02/18/2008 [facility] Patient Information 2014 [place], [place].  _______________________________________________________________________

## 2019-11-19 NOTE — Progress Notes (Addendum)
PCP - Lawerance Cruel, MD Cardiologist - N/A  Chest x-ray - 10-31-19 EKG - 09-03-19 Stress Test -  ECHO -  Cardiac Cath -   Sleep Study -  CPAP -   Fasting Blood Sugar -  Checks Blood Sugar _____ times a day  Blood Thinner Instructions: N/A Aspirin Instructions: Last Dose:  Anesthesia review: AAA 5. 6 cm as of 10-31-19  Patient denies shortness of breath, fever, cough and chest pain at PAT appointment   Patient verbalized understanding of instructions that were given to them at the PAT appointment. Patient was also instructed that they will need to review over the PAT instructions again at home before surgery.

## 2019-11-22 ENCOUNTER — Encounter (HOSPITAL_COMMUNITY)
Admission: RE | Admit: 2019-11-22 | Discharge: 2019-11-22 | Disposition: A | Discharge status: Home / Self Care | Payer: Medicare Other | Source: Ambulatory Visit | Attending: General Surgery | Admitting: General Surgery

## 2019-11-22 ENCOUNTER — Other Ambulatory Visit: Payer: Self-pay

## 2019-11-22 ENCOUNTER — Other Ambulatory Visit (HOSPITAL_COMMUNITY): Payer: Medicare Other

## 2019-11-22 ENCOUNTER — Encounter (HOSPITAL_COMMUNITY): Payer: Self-pay

## 2019-11-22 DIAGNOSIS — I491 Atrial premature depolarization: Secondary | ICD-10-CM | POA: Insufficient documentation

## 2019-11-22 DIAGNOSIS — Z01812 Encounter for preprocedural laboratory examination: Secondary | ICD-10-CM | POA: Insufficient documentation

## 2019-11-22 DIAGNOSIS — Z8546 Personal history of malignant neoplasm of prostate: Secondary | ICD-10-CM | POA: Diagnosis not present

## 2019-11-22 DIAGNOSIS — M1990 Unspecified osteoarthritis, unspecified site: Secondary | ICD-10-CM | POA: Insufficient documentation

## 2019-11-22 DIAGNOSIS — Z87891 Personal history of nicotine dependence: Secondary | ICD-10-CM | POA: Insufficient documentation

## 2019-11-22 DIAGNOSIS — Z85038 Personal history of other malignant neoplasm of large intestine: Secondary | ICD-10-CM | POA: Diagnosis not present

## 2019-11-22 DIAGNOSIS — I714 Abdominal aortic aneurysm, without rupture: Secondary | ICD-10-CM | POA: Diagnosis not present

## 2019-11-22 DIAGNOSIS — R918 Other nonspecific abnormal finding of lung field: Secondary | ICD-10-CM | POA: Insufficient documentation

## 2019-11-22 DIAGNOSIS — Z79899 Other long term (current) drug therapy: Secondary | ICD-10-CM | POA: Insufficient documentation

## 2019-11-22 DIAGNOSIS — Z85820 Personal history of malignant melanoma of skin: Secondary | ICD-10-CM | POA: Diagnosis not present

## 2019-11-22 DIAGNOSIS — I700 Atherosclerosis of aorta: Secondary | ICD-10-CM | POA: Diagnosis not present

## 2019-11-22 DIAGNOSIS — C189 Malignant neoplasm of colon, unspecified: Secondary | ICD-10-CM | POA: Insufficient documentation

## 2019-11-22 DIAGNOSIS — K5730 Diverticulosis of large intestine without perforation or abscess without bleeding: Secondary | ICD-10-CM | POA: Insufficient documentation

## 2019-11-22 LAB — BASIC METABOLIC PANEL
Anion gap: 8 (ref 5–15)
BUN: 27 mg/dL — ABNORMAL HIGH (ref 8–23)
CO2: 26 mmol/L (ref 22–32)
Calcium: 8.8 mg/dL — ABNORMAL LOW (ref 8.9–10.3)
Chloride: 105 mmol/L (ref 98–111)
Creatinine, Ser: 1.09 mg/dL (ref 0.61–1.24)
GFR calc Af Amer: >60 mL/min (ref >60)
GFR calc non Af Amer: >60 mL/min (ref >60)
Glucose, Bld: 111 mg/dL — ABNORMAL HIGH (ref 70–99)
Potassium: 4.5 mmol/L (ref 3.5–5.1)
Sodium: 139 mmol/L (ref 135–145)

## 2019-11-22 LAB — CBC
HCT: 35.1 % — ABNORMAL LOW (ref 39.0–52.0)
Hemoglobin: 9.7 g/dL — ABNORMAL LOW (ref 13.0–17.0)
MCH: 19.2 pg — ABNORMAL LOW (ref 26.0–34.0)
MCHC: 27.6 g/dL — ABNORMAL LOW (ref 30.0–36.0)
MCV: 69.5 fL — ABNORMAL LOW (ref 80.0–100.0)
Platelets: 303 10*3/uL (ref 150–400)
RBC: 5.05 MIL/uL (ref 4.22–5.81)
RDW: 26.5 % — ABNORMAL HIGH (ref 11.5–15.5)
WBC: 4.9 10*3/uL (ref 4.0–10.5)
nRBC: 0 % (ref 0.0–0.2)

## 2019-11-22 LAB — TYPE AND SCREEN
ABO/RH(D): A POS
Antibody Screen: NEGATIVE

## 2019-11-27 NOTE — Progress Notes (Addendum)
Anesthesia Chart Review   Case: [id number] Date/Time: 12/04/19 1515   Procedure: XI ROBOT ASSISTED PARTIAL COLECTOMY (N/A )   Anesthesia type: General   Pre-op diagnosis: COLON CANCER   Location: [facility] ROOM 02 / [facility]   Surgeons: Leighton Ruff, MD      [age] y.o. former smoker (7 pack years, quit 06/18/66) with h/o PONV, AAA, colon cancer scheduled for above procedure [id number] with Dr. Leighton Ruff.   AAA followed by Dr. Curt Jews.  Previously scheduled for elective repair.  During preop evaluation Hgb 7.4.  Subsequently underwent colonoscopy and 2 iron transfusions.  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    Ct Abdomen 10/31/2019 with AAA 5.6cm, 5.4cm on CT Angio Abdomen/Pelvis 08/07/2019.   Vascular clearance requested.   Addendum 12/02/2019:  Vascular clearance received and on chart.  VS: Ht 5\' 8"  (1.727 m)   Wt 76.2 kg   BMI 25.54 kg/m   PROVIDERS: Lawerance Cruel, MD is PCP   Curt Jews, MD is Cardiologist  LABS: Labs reviewed: Acceptable for surgery. (all labs ordered are listed, but only abnormal results are displayed)  Labs Reviewed  BASIC METABOLIC PANEL - Abnormal; Notable for the following components:      Result Value   Glucose, Bld 111 (*)    BUN 27 (*)    Calcium 8.8 (*)    All other components within normal limits  CBC - Abnormal; Notable for the following components:   Hemoglobin 9.7 (*)    HCT 35.1 (*)    MCV 69.5 (*)    MCH 19.2 (*)    MCHC 27.6 (*)    RDW 26.5 (*)    All other components within normal limits  TYPE AND SCREEN     IMAGES: CT Abdomen Pelvis 10/31/2019 IMPRESSION: 1. Annular lesion at the junction of the ascending colon and hepatic flexure, compatible with reported history of primary colonic malignancy. 2. Mildly prominent rounded right mesenteric lymph node near the colonic lesion, suspicious for local nodal metastasis. 3. Tiny pulmonary nodules in the lower lobes, largest 3  mm, recommend attention on follow-up chest CT in 3 months. 4. No additional potential findings of metastatic disease in the chest, abdomen or pelvis. 5. Infrarenal 5.6 cm abdominal aortic aneurysm. Greater than 5.5 cm Abdominal Aortic Aneurysm (ICD10-I71.9). Vascular surgery referral/consultation is recommended due to increased risk for rupture. This recommendation follows ACR consensus guidelines: White Paper of the ACR Incidental Findings Committee II on Vascular Findings. J Am Coll Radiol 2013; 10:789-794. 6. Moderate sigmoid diverticulosis. 7. Aortic Atherosclerosis (ICD10-I70.0).  EKG: 09/03/2019 Rate 76 bpm Sinus rhythm with Premature atrial complexes Incomplete right bundle branch block Borderline ECG PAC's are new since prior tracing  CV:  Past Medical History:  Diagnosis Date  . Arthritis    "probably in my knees and right shoulder" (11/22/2017)  . Duodenal ulcer 1964  . Enlarged prostate without urinary obstruction   . History of Guillain-Barre syndrome 2003  . History of hiatal hernia    per patient about 20 years ago  . History of hydronephrosis    W/ STRICTURE - not aware of this  . History of kidney stones    "passed it"  . History of melanoma excision    BACK-- 04-04-2007 W/ SLN BX RIGHT AXILL, right arm 2017  . History of nephrolithiasis    right kidney  . Inguinal hernia    bilateral sides per patient about 20 years ago  . Microscopic hematuria  history of  . PONV (postoperative nausea and vomiting)    w/hip replacement  . Prostate cancer ([place]) 05/30/13   gleason 3+3=6, volume 58 cc  . Skin cancer 04/04/2007; 2017   "back, right arm"    Past Surgical History:  Procedure Laterality Date  . CATARACT EXTRACTION W/ INTRAOCULAR LENS  IMPLANT, BILATERAL Bilateral   . HERNIA REPAIR  [id number]   w/mesh, umbilical  . INGUINAL HERNIA REPAIR Bilateral 1990's  . JOINT REPLACEMENT    . MELANOMA EXCISION  04/04/2007; 2017   back W/ SLN BX RIGHT AXILL; right  arm  . PROSTATE BIOPSY  01/02/13   benign, one small focus of atypical cells right core  . PROSTATE BIOPSY  05/30/13   Gleason 3+3=6, volume 49 cc  . RADIOACTIVE SEED IMPLANT N/A 08/08/2013   Procedure: RADIOACTIVE SEED IMPLANT;  Surgeon: Molli Hazard, MD;  Location: [facility];  Service: Urology;  Laterality: N/A;   59 SEED IMPLANTED NO SEEDS FOUND IN BLADDER  . TONSILLECTOMY AND ADENOIDECTOMY  childhood  . TOTAL HIP ARTHROPLASTY Right 01-06-2009  . TOTAL SHOULDER ARTHROPLASTY Right 11/22/2017  . TOTAL SHOULDER ARTHROPLASTY Right 11/22/2017   Procedure: TOTAL SHOULDER ARTHROPLASTY;  Surgeon: Hiram Gash, MD;  Location: [facility];  Service: Orthopedics;  Laterality: Right;    MEDICATIONS: . acetaminophen (TYLENOL) 500 MG tablet  . Multiple Vitamins-Minerals (PRESERVISION AREDS 2) CAPS  . Polyvinyl Alcohol-Povidone (REFRESH OP)   No current facility-administered medications for this encounter.      Maia Plan [facility] Pre-Surgical Testing [phone] 11/27/19  3:20 PM

## 2019-11-29 ENCOUNTER — Encounter: Payer: Self-pay | Admitting: Vascular Surgery

## 2019-11-30 ENCOUNTER — Other Ambulatory Visit (HOSPITAL_COMMUNITY)
Admission: RE | Admit: 2019-11-30 | Discharge: 2019-11-30 | Disposition: A | Discharge status: Home / Self Care | Payer: Medicare Other | Source: Ambulatory Visit | Attending: General Surgery | Admitting: General Surgery

## 2019-11-30 DIAGNOSIS — Z01812 Encounter for preprocedural laboratory examination: Secondary | ICD-10-CM | POA: Insufficient documentation

## 2019-11-30 DIAGNOSIS — Z20822 Contact with and (suspected) exposure to covid-19: Secondary | ICD-10-CM | POA: Diagnosis not present

## 2019-11-30 LAB — SARS CORONAVIRUS 2 (TAT 6-24 HRS): SARS Coronavirus 2: NEGATIVE

## 2019-12-02 NOTE — Anesthesia Preprocedure Evaluation (Addendum)
Anesthesia Evaluation  Patient identified by MRN, date of birth, ID band Patient awake    Reviewed: Allergy & Precautions, NPO status , Patient's Chart, lab work & pertinent test results  History of Anesthesia Complications Negative for: history of anesthetic complications  Airway Mallampati: I  TM Distance: >3 FB Neck ROM: Full    Dental no notable dental hx.    Pulmonary former smoker,    Pulmonary exam normal        Cardiovascular negative cardio ROS Normal cardiovascular exam     Neuro/Psych negative neurological ROS  negative psych ROS   GI/Hepatic Neg liver ROS, hiatal hernia, PUD, Colon ca   Endo/Other  negative endocrine ROS  Renal/GU negative Renal ROS   Hx of prostate ca    Musculoskeletal  (+) Arthritis ,   Abdominal   Peds  Hematology  (+) anemia , Hgb 9.7   Anesthesia Other Findings Day of surgery medications reviewed with patient.  Reproductive/Obstetrics negative OB ROS                           Anesthesia Physical Anesthesia Plan  ASA: III  Anesthesia Plan: General   Post-op Pain Management:    Induction: Intravenous  PONV Risk Score and Plan: 4 or greater and Treatment may vary due to age or medical condition, Ondansetron and Dexamethasone  Airway Management Planned: Oral ETT  Additional Equipment: None  Intra-op Plan:   Post-operative Plan: Extubation in OR  Informed Consent: I have reviewed the patients History and Physical, chart, labs and discussed the procedure including the risks, benefits and alternatives for the proposed anesthesia with the patient or authorized representative who has indicated his/her understanding and acceptance.     Dental advisory given  Plan Discussed with: CRNA  Anesthesia Plan Comments: (See PAT note 11/22/19, Konrad Felix, PA-C)      Anesthesia Quick Evaluation

## 2019-12-03 ENCOUNTER — Telehealth (HOSPITAL_COMMUNITY): Payer: Self-pay | Admitting: *Deleted

## 2019-12-03 MED ORDER — BUPIVACAINE LIPOSOME 1.3 % IJ SUSP
20.0000 mL | INTRAMUSCULAR | Status: DC
  Filled 2019-12-03: qty 20

## 2019-12-04 ENCOUNTER — Inpatient Hospital Stay (HOSPITAL_COMMUNITY): Payer: Medicare Other | Admitting: Anesthesiology

## 2019-12-04 ENCOUNTER — Inpatient Hospital Stay (HOSPITAL_COMMUNITY)
Admission: RE | Admit: 2019-12-04 | Discharge: 2019-12-06 | DRG: 331 | Disposition: A | Discharge status: Home / Self Care | Payer: Medicare Other | Source: Ambulatory Visit | Attending: General Surgery | Admitting: General Surgery

## 2019-12-04 ENCOUNTER — Encounter (HOSPITAL_COMMUNITY): Payer: Self-pay | Admitting: General Surgery

## 2019-12-04 ENCOUNTER — Other Ambulatory Visit: Payer: Self-pay

## 2019-12-04 ENCOUNTER — Encounter (HOSPITAL_COMMUNITY)
Admission: RE | Disposition: A | Discharge status: Home / Self Care | Payer: Self-pay | Source: Ambulatory Visit | Attending: General Surgery

## 2019-12-04 ENCOUNTER — Inpatient Hospital Stay (HOSPITAL_COMMUNITY): Payer: Medicare Other | Admitting: Physician Assistant

## 2019-12-04 ENCOUNTER — Telehealth (HOSPITAL_COMMUNITY): Payer: Self-pay | Admitting: *Deleted

## 2019-12-04 DIAGNOSIS — C188 Malignant neoplasm of overlapping sites of colon: Secondary | ICD-10-CM | POA: Diagnosis present

## 2019-12-04 DIAGNOSIS — Z87442 Personal history of urinary calculi: Secondary | ICD-10-CM | POA: Diagnosis present

## 2019-12-04 DIAGNOSIS — I714 Abdominal aortic aneurysm, without rupture, unspecified: Secondary | ICD-10-CM

## 2019-12-04 DIAGNOSIS — Z85820 Personal history of malignant melanoma of skin: Secondary | ICD-10-CM | POA: Diagnosis not present

## 2019-12-04 DIAGNOSIS — Z87891 Personal history of nicotine dependence: Secondary | ICD-10-CM | POA: Diagnosis not present

## 2019-12-04 DIAGNOSIS — K388 Other specified diseases of appendix: Secondary | ICD-10-CM | POA: Diagnosis not present

## 2019-12-04 DIAGNOSIS — Z8711 Personal history of peptic ulcer disease: Secondary | ICD-10-CM | POA: Diagnosis not present

## 2019-12-04 DIAGNOSIS — Z8546 Personal history of malignant neoplasm of prostate: Secondary | ICD-10-CM

## 2019-12-04 DIAGNOSIS — Z8669 Personal history of other diseases of the nervous system and sense organs: Secondary | ICD-10-CM

## 2019-12-04 DIAGNOSIS — M19011 Primary osteoarthritis, right shoulder: Secondary | ICD-10-CM | POA: Diagnosis present

## 2019-12-04 DIAGNOSIS — C189 Malignant neoplasm of colon, unspecified: Secondary | ICD-10-CM | POA: Diagnosis not present

## 2019-12-04 DIAGNOSIS — K269 Duodenal ulcer, unspecified as acute or chronic, without hemorrhage or perforation: Secondary | ICD-10-CM | POA: Diagnosis present

## 2019-12-04 DIAGNOSIS — K449 Diaphragmatic hernia without obstruction or gangrene: Secondary | ICD-10-CM | POA: Diagnosis present

## 2019-12-04 DIAGNOSIS — C61 Malignant neoplasm of prostate: Secondary | ICD-10-CM | POA: Diagnosis not present

## 2019-12-04 DIAGNOSIS — C183 Malignant neoplasm of hepatic flexure: Secondary | ICD-10-CM | POA: Diagnosis not present

## 2019-12-04 DIAGNOSIS — D630 Anemia in neoplastic disease: Secondary | ICD-10-CM | POA: Diagnosis not present

## 2019-12-04 DIAGNOSIS — C184 Malignant neoplasm of transverse colon: Secondary | ICD-10-CM | POA: Diagnosis not present

## 2019-12-04 DIAGNOSIS — Z8719 Personal history of other diseases of the digestive system: Secondary | ICD-10-CM

## 2019-12-04 DIAGNOSIS — M1990 Unspecified osteoarthritis, unspecified site: Secondary | ICD-10-CM | POA: Diagnosis present

## 2019-12-04 SURGERY — COLECTOMY, PARTIAL, ROBOT-ASSISTED, LAPAROSCOPIC
Anesthesia: General | Laterality: Right

## 2019-12-04 MED ORDER — ONDANSETRON HCL 4 MG/2ML IJ SOLN: 4.0000 mg | Freq: Four times a day (QID) | INTRAMUSCULAR | Status: DC | PRN

## 2019-12-04 MED ORDER — ALVIMOPAN 12 MG PO CAPS
12.0000 mg | ORAL_CAPSULE | Freq: Two times a day (BID) | ORAL | Status: DC
  Administered 2019-12-05: 12 mg via ORAL
  Filled 2019-12-04: qty 1

## 2019-12-04 MED ORDER — GABAPENTIN 300 MG PO CAPS
300.0000 mg | ORAL_CAPSULE | Freq: Two times a day (BID) | ORAL | Status: DC
  Administered 2019-12-04 – 2019-12-05 (×3): 300 mg via ORAL
  Filled 2019-12-04 (×4): qty 1

## 2019-12-04 MED ORDER — LIDOCAINE 2% (20 MG/ML) 5 ML SYRINGE
INTRAMUSCULAR | Status: DC | PRN
  Administered 2019-12-04: 80 mg via INTRAVENOUS

## 2019-12-04 MED ORDER — PROMETHAZINE HCL 25 MG/ML IJ SOLN: 6.2500 mg | INTRAMUSCULAR | Status: DC | PRN

## 2019-12-04 MED ORDER — TRAMADOL HCL 50 MG PO TABS: 50.0000 mg | ORAL_TABLET | Freq: Four times a day (QID) | ORAL | Status: DC | PRN

## 2019-12-04 MED ORDER — LACTATED RINGERS IV SOLN: INTRAVENOUS | Status: DC | PRN

## 2019-12-04 MED ORDER — GABAPENTIN 300 MG PO CAPS
300.0000 mg | ORAL_CAPSULE | ORAL | Status: AC
  Administered 2019-12-04: 300 mg via ORAL
  Filled 2019-12-04: qty 1

## 2019-12-04 MED ORDER — ALVIMOPAN 12 MG PO CAPS
12.0000 mg | ORAL_CAPSULE | ORAL | Status: AC
  Administered 2019-12-04: 12 mg via ORAL
  Filled 2019-12-04: qty 1

## 2019-12-04 MED ORDER — BUPIVACAINE HCL 0.25 % IJ SOLN
INTRAMUSCULAR | Status: AC
  Filled 2019-12-04: qty 1

## 2019-12-04 MED ORDER — FENTANYL CITRATE (PF) 100 MCG/2ML IJ SOLN
INTRAMUSCULAR | Status: DC | PRN
  Administered 2019-12-04 (×2): 100 ug via INTRAVENOUS

## 2019-12-04 MED ORDER — ALUM & MAG HYDROXIDE-SIMETH 200-200-20 MG/5ML PO SUSP: 30.0000 mL | Freq: Four times a day (QID) | ORAL | Status: DC | PRN

## 2019-12-04 MED ORDER — EPHEDRINE SULFATE-NACL 50-0.9 MG/10ML-% IV SOSY
PREFILLED_SYRINGE | INTRAVENOUS | Status: DC | PRN
  Administered 2019-12-04: 5 mg via INTRAVENOUS
  Administered 2019-12-04 (×2): 10 mg via INTRAVENOUS
  Administered 2019-12-04: 5 mg via INTRAVENOUS

## 2019-12-04 MED ORDER — SODIUM CHLORIDE 0.9 % IV SOLN
2.0000 g | INTRAVENOUS | Status: AC
  Administered 2019-12-04: 2 g via INTRAVENOUS
  Filled 2019-12-04: qty 2

## 2019-12-04 MED ORDER — 0.9 % SODIUM CHLORIDE (POUR BTL) OPTIME
TOPICAL | Status: DC | PRN
  Administered 2019-12-04: 2000 mL

## 2019-12-04 MED ORDER — ROCURONIUM BROMIDE 10 MG/ML (PF) SYRINGE
PREFILLED_SYRINGE | INTRAVENOUS | Status: DC | PRN
  Administered 2019-12-04: 60 mg via INTRAVENOUS
  Administered 2019-12-04 (×2): 10 mg via INTRAVENOUS

## 2019-12-04 MED ORDER — LACTATED RINGERS IR SOLN
Status: DC | PRN
  Administered 2019-12-04: 1000 mL

## 2019-12-04 MED ORDER — SUGAMMADEX SODIUM 200 MG/2ML IV SOLN
INTRAVENOUS | Status: DC | PRN
  Administered 2019-12-04: 200 mg via INTRAVENOUS

## 2019-12-04 MED ORDER — PROPOFOL 10 MG/ML IV BOLUS
INTRAVENOUS | Status: DC | PRN
  Administered 2019-12-04: 130 mg via INTRAVENOUS

## 2019-12-04 MED ORDER — BUPIVACAINE LIPOSOME 1.3 % IJ SUSP
INTRAMUSCULAR | Status: DC | PRN
  Administered 2019-12-04: 20 mL

## 2019-12-04 MED ORDER — FENTANYL CITRATE (PF) 100 MCG/2ML IJ SOLN
INTRAMUSCULAR | Status: AC
  Filled 2019-12-04: qty 2

## 2019-12-04 MED ORDER — ONDANSETRON HCL 4 MG PO TABS: 4.0000 mg | ORAL_TABLET | Freq: Four times a day (QID) | ORAL | Status: DC | PRN

## 2019-12-04 MED ORDER — KCL IN DEXTROSE-NACL 20-5-0.45 MEQ/L-%-% IV SOLN
INTRAVENOUS | Status: DC
  Filled 2019-12-04 (×2): qty 1000

## 2019-12-04 MED ORDER — LACTATED RINGERS IV SOLN: INTRAVENOUS | Status: DC

## 2019-12-04 MED ORDER — PHENYLEPHRINE 40 MCG/ML (10ML) SYRINGE FOR IV PUSH (FOR BLOOD PRESSURE SUPPORT)
PREFILLED_SYRINGE | INTRAVENOUS | Status: DC | PRN
  Administered 2019-12-04 (×5): 80 ug via INTRAVENOUS

## 2019-12-04 MED ORDER — HYDROMORPHONE HCL 1 MG/ML IJ SOLN: 0.2500 mg | INTRAMUSCULAR | Status: DC | PRN

## 2019-12-04 MED ORDER — SACCHAROMYCES BOULARDII 250 MG PO CAPS
250.0000 mg | ORAL_CAPSULE | Freq: Two times a day (BID) | ORAL | Status: DC
  Administered 2019-12-04 – 2019-12-06 (×4): 250 mg via ORAL
  Filled 2019-12-04 (×4): qty 1

## 2019-12-04 MED ORDER — HYDROMORPHONE HCL 1 MG/ML IJ SOLN: 0.5000 mg | INTRAMUSCULAR | Status: DC | PRN

## 2019-12-04 MED ORDER — DEXAMETHASONE SODIUM PHOSPHATE 10 MG/ML IJ SOLN
INTRAMUSCULAR | Status: DC | PRN
  Administered 2019-12-04: 4 mg via INTRAVENOUS

## 2019-12-04 MED ORDER — PROPOFOL 10 MG/ML IV BOLUS
INTRAVENOUS | Status: AC
  Filled 2019-12-04: qty 20

## 2019-12-04 MED ORDER — ENOXAPARIN SODIUM 40 MG/0.4ML ~~LOC~~ SOLN
40.0000 mg | SUBCUTANEOUS | Status: DC
  Administered 2019-12-05 – 2019-12-06 (×2): 40 mg via SUBCUTANEOUS
  Filled 2019-12-04 (×2): qty 0.4

## 2019-12-04 MED ORDER — EPHEDRINE 5 MG/ML INJ
INTRAVENOUS | Status: AC
  Filled 2019-12-04: qty 10

## 2019-12-04 MED ORDER — ONDANSETRON HCL 4 MG/2ML IJ SOLN
INTRAMUSCULAR | Status: DC | PRN
  Administered 2019-12-04: 4 mg via INTRAVENOUS

## 2019-12-04 MED ORDER — ACETAMINOPHEN 500 MG PO TABS
1000.0000 mg | ORAL_TABLET | ORAL | Status: AC
  Administered 2019-12-04: 1000 mg via ORAL
  Filled 2019-12-04: qty 2

## 2019-12-04 MED ORDER — ENSURE SURGERY PO LIQD
237.0000 mL | Freq: Two times a day (BID) | ORAL | Status: DC
  Administered 2019-12-04 – 2019-12-06 (×3): 237 mL via ORAL
  Filled 2019-12-04 (×5): qty 237

## 2019-12-04 MED ORDER — PHENYLEPHRINE 40 MCG/ML (10ML) SYRINGE FOR IV PUSH (FOR BLOOD PRESSURE SUPPORT)
PREFILLED_SYRINGE | INTRAVENOUS | Status: AC
  Filled 2019-12-04: qty 10

## 2019-12-04 MED ORDER — BUPIVACAINE HCL (PF) 0.25 % IJ SOLN
INTRAMUSCULAR | Status: DC | PRN
  Administered 2019-12-04: 30 mL

## 2019-12-04 MED ORDER — SODIUM CHLORIDE 0.9 % IV SOLN
2.0000 g | Freq: Two times a day (BID) | INTRAVENOUS | Status: AC
  Administered 2019-12-04: 2 g via INTRAVENOUS
  Filled 2019-12-04: qty 2

## 2019-12-04 SURGICAL SUPPLY — 85 items
BLADE EXTENDED COATED 6.5IN (ELECTRODE) IMPLANT
CANNULA REDUC XI 12-8 STAPL (CANNULA)
CANNULA REDUCER 12-8 DVNC XI (CANNULA) IMPLANT
CELLS DAT CNTRL 66122 CELL SVR (MISCELLANEOUS) IMPLANT
COVER SURGICAL LIGHT HANDLE (MISCELLANEOUS) ×4 IMPLANT
COVER TIP SHEARS 8 DVNC (MISCELLANEOUS) ×1 IMPLANT
COVER TIP SHEARS 8MM DA VINCI (MISCELLANEOUS) ×2
COVER WAND RF STERILE (DRAPES) ×2 IMPLANT
DECANTER SPIKE VIAL GLASS SM (MISCELLANEOUS) IMPLANT
DRAIN CHANNEL 19F RND (DRAIN) IMPLANT
DRAPE ARM DVNC X/XI (DISPOSABLE) ×4 IMPLANT
DRAPE COLUMN DVNC XI (DISPOSABLE) ×1 IMPLANT
DRAPE DA VINCI XI ARM (DISPOSABLE) ×8
DRAPE DA VINCI XI COLUMN (DISPOSABLE) ×2
DRAPE SURG IRRIG POUCH 19X23 (DRAPES) ×2 IMPLANT
DRSG OPSITE POSTOP 4X10 (GAUZE/BANDAGES/DRESSINGS) IMPLANT
DRSG OPSITE POSTOP 4X6 (GAUZE/BANDAGES/DRESSINGS) ×1 IMPLANT
DRSG OPSITE POSTOP 4X8 (GAUZE/BANDAGES/DRESSINGS) IMPLANT
ELECT PENCIL ROCKER SW 15FT (MISCELLANEOUS) ×2 IMPLANT
ELECT REM PT RETURN 15FT ADLT (MISCELLANEOUS) ×2 IMPLANT
ENDOLOOP SUT PDS II  0 18 (SUTURE)
ENDOLOOP SUT PDS II 0 18 (SUTURE) IMPLANT
EVACUATOR SILICONE 100CC (DRAIN) IMPLANT
GLOVE BIO SURGEON STRL SZ 6.5 (GLOVE) ×6 IMPLANT
GLOVE BIOGEL PI IND STRL 7.0 (GLOVE) ×2 IMPLANT
GLOVE BIOGEL PI INDICATOR 7.0 (GLOVE) ×2
GOWN STRL REUS W/TWL XL LVL3 (GOWN DISPOSABLE) ×6 IMPLANT
GRASPER SUT TROCAR 14GX15 (MISCELLANEOUS) IMPLANT
HOLDER FOLEY CATH W/STRAP (MISCELLANEOUS) ×2 IMPLANT
IRRIG SUCT STRYKERFLOW 2 WTIP (MISCELLANEOUS) ×2
IRRIGATION SUCT STRKRFLW 2 WTP (MISCELLANEOUS) ×1 IMPLANT
KIT PROCEDURE DA VINCI SI (MISCELLANEOUS)
KIT PROCEDURE DVNC SI (MISCELLANEOUS) IMPLANT
KIT TURNOVER KIT A (KITS) IMPLANT
NDL INSUFFLATION 14GA 120MM (NEEDLE) ×1 IMPLANT
NEEDLE INSUFFLATION 14GA 120MM (NEEDLE) ×2 IMPLANT
PACK CARDIOVASCULAR III (CUSTOM PROCEDURE TRAY) ×2 IMPLANT
PAD POSITIONING PINK XL (MISCELLANEOUS) ×2 IMPLANT
PORT LAP GEL ALEXIS MED 5-9CM (MISCELLANEOUS) IMPLANT
RELOAD STAPLE 60 3.5 BLU DVNC (STAPLE) IMPLANT
RELOAD STAPLER 3.5X60 BLU DVNC (STAPLE) ×3 IMPLANT
RETRACTOR WND ALEXIS 18 MED (MISCELLANEOUS) IMPLANT
RTRCTR WOUND ALEXIS 18CM MED (MISCELLANEOUS)
SCISSORS LAP 5X35 DISP (ENDOMECHANICALS) IMPLANT
SEAL CANN UNIV 5-8 DVNC XI (MISCELLANEOUS) ×3 IMPLANT
SEAL XI 5MM-8MM UNIVERSAL (MISCELLANEOUS) ×6
SEALER VESSEL DA VINCI XI (MISCELLANEOUS) ×2
SEALER VESSEL EXT DVNC XI (MISCELLANEOUS) ×1 IMPLANT
SOLUTION ELECTROLUBE (MISCELLANEOUS) ×2 IMPLANT
STAPLER 45 DA VINCI SURE FORM (STAPLE)
STAPLER 45 SUREFORM DVNC (STAPLE) IMPLANT
STAPLER 60 DA VINCI SURE FORM (STAPLE) ×2
STAPLER 60 SUREFORM DVNC (STAPLE) IMPLANT
STAPLER CANNULA SEAL DVNC XI (STAPLE) IMPLANT
STAPLER CANNULA SEAL XI (STAPLE)
STAPLER RELOAD 3.5X60 BLU DVNC (STAPLE) ×3
STAPLER RELOAD 3.5X60 BLUE (STAPLE) ×6
STOPCOCK 4 WAY LG BORE MALE ST (IV SETS) ×4 IMPLANT
SUT ETHILON 2 0 PS N (SUTURE) IMPLANT
SUT NOVA NAB DX-16 0-1 5-0 T12 (SUTURE) ×4 IMPLANT
SUT PROLENE 2 0 KS (SUTURE) IMPLANT
SUT SILK 2 0 (SUTURE) ×2
SUT SILK 2 0 SH CR/8 (SUTURE) IMPLANT
SUT SILK 2-0 18XBRD TIE 12 (SUTURE) ×1 IMPLANT
SUT SILK 3 0 (SUTURE)
SUT SILK 3 0 SH CR/8 (SUTURE) ×2 IMPLANT
SUT SILK 3-0 18XBRD TIE 12 (SUTURE) IMPLANT
SUT V-LOC BARB 180 2/0GR6 GS22 (SUTURE)
SUT VIC AB 2-0 SH 18 (SUTURE) IMPLANT
SUT VIC AB 2-0 SH 27 (SUTURE)
SUT VIC AB 2-0 SH 27X BRD (SUTURE) IMPLANT
SUT VIC AB 3-0 SH 18 (SUTURE) IMPLANT
SUT VIC AB 4-0 PS2 27 (SUTURE) ×4 IMPLANT
SUT VICRYL 0 UR6 27IN ABS (SUTURE) ×2 IMPLANT
SUTURE V-LC BRB 180 2/0GR6GS22 (SUTURE) IMPLANT
SYR 10ML ECCENTRIC (SYRINGE) ×2 IMPLANT
SYS LAPSCP GELPORT 120MM (MISCELLANEOUS)
SYSTEM LAPSCP GELPORT 120MM (MISCELLANEOUS) IMPLANT
TOWEL OR 17X26 10 PK STRL BLUE (TOWEL DISPOSABLE) IMPLANT
TOWEL OR NON WOVEN STRL DISP B (DISPOSABLE) ×2 IMPLANT
TRAY COLON PACK (CUSTOM PROCEDURE TRAY) ×2 IMPLANT
TRAY FOLEY MTR SLVR 16FR STAT (SET/KITS/TRAYS/PACK) ×2 IMPLANT
TROCAR ADV FIXATION 5X100MM (TROCAR) ×2 IMPLANT
TUBING CONNECTING 10 (TUBING) ×4 IMPLANT
TUBING INSUFFLATION 10FT LAP (TUBING) ×2 IMPLANT

## 2019-12-04 NOTE — Anesthesia Procedure Notes (Signed)
Procedure Name: Intubation Date/Time: 12/04/2019 12:03 PM Performed by: Lavina Hamman, CRNA Pre-anesthesia Checklist: Patient identified, Emergency Drugs available, Suction available and Patient being monitored Patient Re-evaluated:Patient Re-evaluated prior to induction Oxygen Delivery Method: Circle System Utilized Preoxygenation: Pre-oxygenation with 100% oxygen Induction Type: IV induction Ventilation: Mask ventilation without difficulty Laryngoscope Size: 4 and Mac Tube type: Oral Tube size: 7.5 mm Number of attempts: 1 Airway Equipment and Method: Stylet and Oral airway Placement Confirmation: ETT inserted through vocal cords under direct vision,  positive ETCO2 and breath sounds checked- equal and bilateral Secured at: 23 cm Tube secured with: Tape Dental Injury: Teeth and Oropharynx as per pre-operative assessment

## 2019-12-04 NOTE — Anesthesia Postprocedure Evaluation (Signed)
Anesthesia Post Note  Patient: Aaron Hawkins  Procedure(s) Performed: XI ROBOT ASSISTED PARTIAL COLECTOMY (Right )     Patient location during evaluation: PACU Anesthesia Type: General Level of consciousness: awake and alert and oriented Pain management: pain level controlled Vital Signs Assessment: post-procedure vital signs reviewed and stable Respiratory status: spontaneous breathing, nonlabored ventilation and respiratory function stable Cardiovascular status: blood pressure returned to baseline Postop Assessment: no apparent nausea or vomiting Anesthetic complications: no              Brennan Bailey

## 2019-12-04 NOTE — Op Note (Signed)
12/04/2019  2:08 PM  PATIENT:  Aaron Hawkins  79 y.o. male  Patient Care Team: Lawerance Cruel, MD as PCP - General (Family Medicine)  PRE-OPERATIVE DIAGNOSIS:  COLON CANCER  POST-OPERATIVE DIAGNOSIS:  ASCENDING COLON CANCER  PROCEDURE:  XI ROBOT ASSISTED RIGHT COLECTOMY    Surgeon(s): Michael Boston, MD Leighton Ruff, MD  ASSISTANT: Dr Johney [place]  ANESTHESIA:   local and general  EBL: 69ml  Total I/O In: 1800 [I.V.:1800] Out: 250 [Urine:200; Blood:50]  Delay start of Pharmacological VTE agent (>24hrs) due to surgical blood loss or risk of bleeding:  no  DRAINS: none   SPECIMEN:  Source of Specimen:  R colon  DISPOSITION OF SPECIMEN:  PATHOLOGY  COUNTS:  YES  PLAN OF CARE: Admit to inpatient   PATIENT DISPOSITION:  PACU - hemodynamically stable.  INDICATION:    Patient was noted to have a apple core colon cancer of the hepatic flexure on diagnostic colonoscopy.  Biopsies show adenocarcinoma.  I recommended segmental resection:  The anatomy & physiology of the digestive tract was discussed.  The pathophysiology was discussed.  Natural history risks without surgery was discussed.   I worked to give an overview of the disease and the frequent need to have multispecialty involvement.  I feel the risks of no intervention will lead to serious problems that outweigh the operative risks; therefore, I recommended a partial colectomy to remove the pathology.  Laparoscopic & open techniques were discussed.   Risks such as bleeding, infection, abscess, leak, reoperation, possible ostomy, hernia, heart attack, death, and other risks were discussed.  I noted a good likelihood this will help address the problem.   Goals of post-operative recovery were discussed as well.    The patient expressed understanding & wished to proceed with surgery.  OR FINDINGS:   Patient had mass noted in the hepatic flexure.  Obvious abdominal aortic aneurysm without signs of impending rupture  No  obvious metastatic disease on visceral parietal peritoneum or liver.  DESCRIPTION:   Informed consent was confirmed.  The patient underwent general anaesthesia without difficulty.  The patient was positioned appropriately.  VTE prevention in place.  The patient's abdomen was clipped, prepped, & draped in a sterile fashion.  Surgical timeout confirmed our plan.  The patient was positioned in reverse Trendelenburg.  Abdominal entry was gained using a varies needle in the left upper quadrant.  Entry was clean.  I induced carbon dioxide insufflation.  An 76mm robotic port was placed in the left upper quadrant.  Camera inspection revealed no injury.  Extra ports were carefully placed under direct laparoscopic visualization.  The robot was then docked to the patient's right side.  Robotic instruments were placed under direct visualization.   I began by identifying the ileocolic artery and vein within the mesentery. Dissection was bluntly carried around these structures. The duodenum was identified and free from the structures. I then separated the structures bluntly and used the robotic vessel sealer device to transect these separately.  I developed the retroperitoneal plane bluntly.  I then freed the appendix off its attachments to the pelvic wall. I mobilized the terminal ileum.  I took care to avoid injuring any retroperitoneal structures.  After this I began to mobilize laterally down the white line of Toldt and then took down the hepatic flexure using the robotic vessel sealer device. I mobilized the omentum off of the right transverse colon. The entire colon was then flipped medially and mobilized off of the retroperitoneal structures until  I could visualize the lateral edge of the duodenum underneath.  I gently freed the duodenal attachments.  I identified the right branch of the middle colic artery.  There was an enlarged lymph node in this branch and therefore I divided this using the vessel sealer in  order to take the lymph node.  I continued up the mesentery to the transverse colon just proximal to the middle colic artery.  I also dissected out the terminal ileal mesentery using the robotic vessel sealer.  I then used a blue load robotic stapler to transect the transverse colon and the terminal ileum.  I then aligned the remaining transverse colon and terminal ileum in a isoperistaltic fashion.  An enterotomy was made in the small bowel and the colon.  A blue load 60 mm robotic stapler was inserted and an anastomosis was created.  There was no bleeding noted within the staple line.  I then closed the common enterotomy using 2 running 2-0 V lock sutures.  The abdomen was inspected in all 4 quadrants.  There was no sign of bleeding or injury.  At this point the robot was undocked and the patient's 12 mm port was enlarged into a Pfannenstiel incision.  An Norris wound protector was placed.  The right colon was removed.  The tumor was noted by palpation.  This was sent to pathology for further examination.  The abdomen was irrigated.  There was no sign of active bleeding.  The wound protector was removed.  The peritoneum was closed using a running 2-0 Vicryl suture.  The fascia was closed using interrupted #1 Novafil sutures.  The skin was closed using a running 4-0 Vicryl subcuticular suture.  A sterile dressing was placed.  The remaining port sites were closed using interrupted 4-0 Vicryl subcuticular sutures and Dermabond.  The patient was then awakened from anesthesia and sent to the postanesthesia care unit in stable condition.  All counts were correct per operating room staff.  An MD assistant was necessary for tissue manipulation, retraction and positioning due to the complexity of the case and hospital policies

## 2019-12-04 NOTE — Transfer of Care (Signed)
Immediate Anesthesia Transfer of Care Note  Patient: Aaron Hawkins  Procedure(s) Performed: Procedure(s): XI ROBOT ASSISTED PARTIAL COLECTOMY (Right)  Patient Location: PACU  Anesthesia Type:General  Level of Consciousness:  sedated, patient cooperative and responds to stimulation  Airway & Oxygen Therapy:Patient Spontanous Breathing and Patient connected to face mask oxgen  Post-op Assessment:  Report given to PACU RN and Post -op Vital signs reviewed and stable  Post vital signs:  Reviewed and stable  Last Vitals:  Vitals:   12/04/19 0946  BP: (!) 142/87  Pulse: 73  Resp: 16  Temp: (!) 36.3 C  SpO2: 123XX123    Complications: No apparent anesthesia complications

## 2019-12-04 NOTE — Discharge Instructions (Signed)

## 2019-12-04 NOTE — Interval H&P Note (Signed)
History and Physical Interval Note:  12/04/2019 11:25 AM  Aaron Hawkins  has presented today for surgery, with the diagnosis of COLON CANCER.  The various methods of treatment have been discussed with the patient and family. After consideration of risks, benefits and other options for treatment, the patient has consented to  Procedure(s): XI ROBOT ASSISTED PARTIAL COLECTOMY (N/A) as a surgical intervention.  The patient's history has been reviewed, patient examined, no change in status, stable for surgery.  I have reviewed the patient's chart and labs.  Questions were answered to the patient's satisfaction.     Rosario Adie, MD  [facility]

## 2019-12-05 LAB — CBC
HCT: 30.9 % — ABNORMAL LOW (ref 39.0–52.0)
Hemoglobin: 8.3 g/dL — ABNORMAL LOW (ref 13.0–17.0)
MCH: 18.2 pg — ABNORMAL LOW (ref 26.0–34.0)
MCHC: 26.9 g/dL — ABNORMAL LOW (ref 30.0–36.0)
MCV: 67.8 fL — ABNORMAL LOW (ref 80.0–100.0)
Platelets: 275 10*3/uL (ref 150–400)
RBC: 4.56 MIL/uL (ref 4.22–5.81)
RDW: 24.3 % — ABNORMAL HIGH (ref 11.5–15.5)
WBC: 7.9 10*3/uL (ref 4.0–10.5)
nRBC: 0 % (ref 0.0–0.2)

## 2019-12-05 LAB — BASIC METABOLIC PANEL
Anion gap: 6 (ref 5–15)
BUN: 14 mg/dL (ref 8–23)
CO2: 25 mmol/L (ref 22–32)
Calcium: 7.9 mg/dL — ABNORMAL LOW (ref 8.9–10.3)
Chloride: 106 mmol/L (ref 98–111)
Creatinine, Ser: 1.04 mg/dL (ref 0.61–1.24)
GFR calc Af Amer: >60 mL/min (ref >60)
GFR calc non Af Amer: >60 mL/min (ref >60)
Glucose, Bld: 132 mg/dL — ABNORMAL HIGH (ref 70–99)
Potassium: 4.2 mmol/L (ref 3.5–5.1)
Sodium: 137 mmol/L (ref 135–145)

## 2019-12-05 NOTE — Progress Notes (Signed)
1 Day Post-Op Robotic R colectomy Subjective: No complaints, min pain.  Ambulating in the hall.  Tolerating clears  Objective: Vital signs in last 24 hours: Temp:  [97.4 F (36.3 C)-98.6 F (37 C)] 98.2 F (36.8 C) (05/20 0556) Pulse Rate:  [66-93] 66 (05/20 0556) Resp:  [13-20] 17 (05/20 0556) BP: (119-154)/(73-91) 136/87 (05/20 0556) SpO2:  [97 %-100 %] 99 % (05/20 0556) Weight:  [73.9 kg] 73.9 kg (05/19 1014)   Intake/Output from previous day: 05/19 0701 - 05/20 0700 In: 3606.2 [P.O.:580; I.V.:2741.3; IV Piggyback:284.8] Out: 1550 [Urine:1500; Blood:50] Intake/Output this shift: No intake/output data recorded.   General appearance: alert and cooperative GI: normal findings: soft, non-tender  Incision: no significant drainage  Lab Results:  Recent Labs    12/05/19 0504  WBC 7.9  HGB 8.3*  HCT 30.9*  PLT 275   BMET Recent Labs    12/05/19 0504  NA 137  K 4.2  CL 106  CO2 25  GLUCOSE 132*  BUN 14  CREATININE 1.04  CALCIUM 7.9*   PT/INR No results for input(s): LABPROT, INR in the last 72 hours. ABG No results for input(s): PHART, HCO3 in the last 72 hours.  Invalid input(s): PCO2, PO2  MEDS, Scheduled . alvimopan  12 mg Oral BID  . enoxaparin (LOVENOX) injection  40 mg Subcutaneous Q24H  . feeding supplement  237 mL Oral BID BM  . gabapentin  300 mg Oral BID  . saccharomyces boulardii  250 mg Oral BID    Studies/Results: No results found.  Assessment: s/p Procedure(s): XI ROBOT ASSISTED PARTIAL COLECTOMY Patient Active Problem List   Diagnosis Date Noted  . Colon cancer ([place]) 12/04/2019  . 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 Localized osteoarthritis of right shoulder 11/08/2017  . Prostate cancer ([place])     Expected post op course  Plan: d/c foley Advance diet  Ambulate in hall SL IVF's   LOS: 1 day     .Rosario Adie, MD [facility], [place]    12/05/2019 7:16 AM

## 2019-12-06 DIAGNOSIS — I714 Abdominal aortic aneurysm, without rupture, unspecified: Secondary | ICD-10-CM

## 2019-12-06 DIAGNOSIS — Z8719 Personal history of other diseases of the digestive system: Secondary | ICD-10-CM

## 2019-12-06 DIAGNOSIS — Z87442 Personal history of urinary calculi: Secondary | ICD-10-CM | POA: Diagnosis present

## 2019-12-06 DIAGNOSIS — M1990 Unspecified osteoarthritis, unspecified site: Secondary | ICD-10-CM | POA: Diagnosis present

## 2019-12-06 LAB — CBC
HCT: 29.3 % — ABNORMAL LOW (ref 39.0–52.0)
Hemoglobin: 8 g/dL — ABNORMAL LOW (ref 13.0–17.0)
MCH: 18.6 pg — ABNORMAL LOW (ref 26.0–34.0)
MCHC: 27.3 g/dL — ABNORMAL LOW (ref 30.0–36.0)
MCV: 68.1 fL — ABNORMAL LOW (ref 80.0–100.0)
Platelets: 266 10*3/uL (ref 150–400)
RBC: 4.3 MIL/uL (ref 4.22–5.81)
RDW: 24.1 % — ABNORMAL HIGH (ref 11.5–15.5)
WBC: 5.8 10*3/uL (ref 4.0–10.5)
nRBC: 0 % (ref 0.0–0.2)

## 2019-12-06 LAB — BASIC METABOLIC PANEL
Anion gap: 4 — ABNORMAL LOW (ref 5–15)
BUN: 16 mg/dL (ref 8–23)
CO2: 30 mmol/L (ref 22–32)
Calcium: 8.2 mg/dL — ABNORMAL LOW (ref 8.9–10.3)
Chloride: 106 mmol/L (ref 98–111)
Creatinine, Ser: 1.02 mg/dL (ref 0.61–1.24)
GFR calc Af Amer: >60 mL/min (ref >60)
GFR calc non Af Amer: >60 mL/min (ref >60)
Glucose, Bld: 102 mg/dL — ABNORMAL HIGH (ref 70–99)
Potassium: 4.1 mmol/L (ref 3.5–5.1)
Sodium: 140 mmol/L (ref 135–145)

## 2019-12-06 MED ORDER — ACETAMINOPHEN 325 MG PO TABS: 325.0000 mg | ORAL_TABLET | Freq: Four times a day (QID) | ORAL | Status: DC | PRN

## 2019-12-06 MED ORDER — LACTATED RINGERS IV BOLUS
1000.0000 mL | Freq: Once | INTRAVENOUS | Status: AC
  Administered 2019-12-06: 1000 mL via INTRAVENOUS

## 2019-12-06 MED ORDER — TRAMADOL HCL 50 MG PO TABS: 50.0000 mg | ORAL_TABLET | Freq: Four times a day (QID) | ORAL | 0 refills | Status: DC | PRN

## 2019-12-06 NOTE — Discharge Summary (Signed)
Physician Discharge Summary    Patient ID: Aaron Hawkins MRN: [id number] DOB/AGE: May 23, 1941  79 y.o.  Patient Care Team: Lawerance Cruel, MD as PCP - General (Family Medicine)  Admit date: 12/04/2019  Discharge date: 12/06/2019  Hospital Stay = 2 days    Discharge Diagnoses:  Active Problems:   Colon cancer ([place])   2 Days Post-Op  12/04/2019  POST-OPERATIVE DIAGNOSIS:   COLON CANCER  SURGERY:  12/04/2019  Procedure(s): XI ROBOT ASSISTED PARTIAL COLECTOMY  SURGEON:    Surgeon(s): Michael Boston, MD Leighton Ruff, MD  Consults: None  Hospital Course:   The patient underwent the surgery above.  Postoperatively, the patient gradually mobilized and advanced to a solid diet.  Pain and other symptoms were treated aggressively.    By the time of discharge, the patient was walking well the hallways, eating food, having flatus.  Pain was well-controlled on an oral medications.  He had mild orthostasis controlled with IVF bolus.  No significant hematochezia.  Based on meeting discharge criteria and continuing to recover, I felt it was safe for the patient to be discharged from the hospital to further recover with close followup. Postoperative recommendations were discussed in detail.  They are written as well.  Discharged Condition: good  Discharge Exam: Blood pressure 123/62, pulse 77, temperature 98.2 F (36.8 C), temperature source Oral, resp. rate 15, height 5\' 8"  (1.727 m), weight 75.9 kg, SpO2 97 %.  General: Pt awake/alert/oriented x4 in No acute distress Eyes: PERRL, normal EOM.  Sclera clear.  No icterus Neuro: CN II-XII intact w/o focal sensory/motor deficits. Lymph: No head/neck/groin lymphadenopathy Psych:  No delerium/psychosis/paranoia HENT: Normocephalic, Mucus membranes moist.  No thrush Neck: Supple, No tracheal deviation Chest: No chest wall pain w good excursion CV:  Pulses intact.  Regular rhythm MS: Normal AROM mjr joints.  No obvious  deformity Abdomen: Soft.  Nondistended.  Nontender.  No evidence of peritonitis.  No incarcerated hernias. Ext:  SCDs BLE.  No mjr edema.  No cyanosis Skin: No petechiae / purpura   Disposition:   Follow-up Information     Leighton Ruff, MD. Schedule an appointment as soon as possible for a visit in 2 weeks.   Specialty: General Surgery Contact information: [address] [place] [address] [phone]            Discharge disposition: 01-Home or Self Care       Discharge Instructions     Call MD for:   Complete by: As directed    FEVER > 101.5 F  (temperatures < 101.5 F are not significant)   Call MD for:  extreme fatigue   Complete by: As directed    Call MD for:  persistant dizziness or light-headedness   Complete by: As directed    Call MD for:  persistant nausea and vomiting   Complete by: As directed    Call MD for:  redness, tenderness, or signs of infection (pain, swelling, redness, odor or green/yellow discharge around incision site)   Complete by: As directed    Call MD for:  severe uncontrolled pain   Complete by: As directed    Diet - low sodium heart healthy   Complete by: As directed    Start with a bland diet such as soups, liquids, starchy foods, low fat foods, etc. the first few days at home. Gradually advance to a solid, low-fat, high fiber diet by the end of the first week at home.   Add  a fiber supplement to your diet (Metamucil, etc) If you feel full, bloated, or constipated, stay on a full liquid or pureed/blenderized diet for a few days until you feel better and are no longer constipated.   Discharge instructions   Complete by: As directed    See Discharge Instructions If you are not getting better after two weeks or are noticing you are getting worse, contact our office [phone] for further advice.  We may need to adjust your medications, re-evaluate you in the office, send you to the emergency room, or see what other  things we can do to help. The clinic staff is available to answer your questions during regular business hours (8:30am-5pm).  Please don't hesitate to call and ask to speak to one of our nurses for clinical concerns.    A surgeon from [facility] is always on call at the hospitals 24 hours/day If you have a medical emergency, go to the nearest emergency room or call 911.   Discharge wound care:   Complete by: As directed    It is good for closed incisions and even open wounds to be washed every day.  Shower every day.  Short baths are fine.  Wash the incisions and wounds clean with soap & water.    You may leave closed incisions open to air if it is dry.   You may cover the incision with clean gauze & replace it after your daily shower for comfort.  DERMABOND:  You have purple skin glue (Dermabond) on your incision(s).  Leave them in place, and they will fall off on their own like a scab.  You may trim any edges that curl up with clean scissors.   Driving Restrictions   Complete by: As directed    You may drive when: - you are no longer taking narcotic prescription pain medication - you can comfortably wear a seatbelt - you can safely make sudden turns/stops without pain.   Increase activity slowly   Complete by: As directed    Start light daily activities --- self-care, walking, climbing stairs- beginning the day after surgery.  Gradually increase activities as tolerated.  Control your pain to be active.  Stop when you are tired.  Ideally, walk several times a day, eventually an hour a day.   Most people are back to most day-to-day activities in a few weeks.  It takes 4-6 weeks to get back to unrestricted, intense activity. If you can walk 30 minutes without difficulty, it is safe to try more intense activity such as jogging, treadmill, bicycling, low-impact aerobics, swimming, etc. Save the most intensive and strenuous activity for last (Usually 4-8 weeks after surgery) such as  sit-ups, heavy lifting, contact sports, etc.  Refrain from any intense heavy lifting or straining until you are off narcotics for pain control.  You will have off days, but things should improve week-by-week. DO NOT PUSH THROUGH PAIN.  Let pain be your guide: If it hurts to do something, don't do it.   Lifting restrictions   Complete by: As directed    If you can walk 30 minutes without difficulty, it is safe to try more intense activity such as jogging, treadmill, bicycling, low-impact aerobics, swimming, etc. Save the most intensive and strenuous activity for last (Usually 4-8 weeks after surgery) such as sit-ups, heavy lifting, contact sports, etc.   Refrain from any intense heavy lifting or straining until you are off narcotics for pain control.  You will have  off days, but things should improve week-by-week. DO NOT PUSH THROUGH PAIN.  Let pain be your guide: If it hurts to do something, don't do it.  Pain is your body warning you to avoid that activity for another week until the pain goes down.   May shower / Bathe   Complete by: As directed    May walk up steps   Complete by: As directed    Remove dressing in 72 hours   Complete by: As directed    Make sure all dressings are removed by the third day after surgery = 5/22 Saturday.   Leave incisions open to air.  OK to cover incisions with gauze or bandages as desired   Sexual Activity Restrictions   Complete by: As directed    You may have sexual intercourse when it is comfortable. If it hurts to do something, stop.       Allergies as of 12/06/2019   No Known Allergies     Medication List    TAKE these medications   acetaminophen 500 MG tablet Commonly known as: TYLENOL Take 500 mg by mouth every 6 (six) hours as needed for moderate pain.   PreserVision AREDS 2 Caps Take 1 capsule by mouth 2 (two) times daily.   REFRESH OP Place 2 drops into both eyes daily as needed (dry eyes).   traMADol 50 MG tablet Commonly known as:  ULTRAM Take 1 tablet (50 mg total) by mouth every 6 (six) hours as needed (mild pain).            Discharge Care Instructions  (From admission, onward)         Start     Ordered   12/06/19 0000  Discharge wound care:    Comments: It is good for closed incisions and even open wounds to be washed every day.  Shower every day.  Short baths are fine.  Wash the incisions and wounds clean with soap & water.    You may leave closed incisions open to air if it is dry.   You may cover the incision with clean gauze & replace it after your daily shower for comfort.  DERMABOND:  You have purple skin glue (Dermabond) on your incision(s).  Leave them in place, and they will fall off on their own like a scab.  You may trim any edges that curl up with clean scissors.   12/06/19 0732          Significant Diagnostic Studies:  Results for orders placed or performed during the hospital encounter of 12/04/19 (from the past 72 hour(s))  CBC     Status: Abnormal   Collection Time: 12/05/19  5:04 AM  Result Value Ref Range   WBC 7.9 4.0 - 10.5 K/uL   RBC 4.56 4.22 - 5.81 MIL/uL   Hemoglobin 8.3 (L) 13.0 - 17.0 g/dL    Comment: Reticulocyte Hemoglobin testing may be clinically indicated, consider ordering this additional test [id number]    HCT 30.9 (L) 39.0 - 52.0 %   MCV 67.8 (L) 80.0 - 100.0 fL   MCH 18.2 (L) 26.0 - 34.0 pg   MCHC 26.9 (L) 30.0 - 36.0 g/dL   RDW 24.3 (H) 11.5 - 15.5 %   Platelets 275 150 - 400 K/uL   nRBC 0.0 0.0 - 0.2 %    Comment: Performed at [facility], [place] [address], [place],  [id number]  Basic metabolic panel     Status: Abnormal  Collection Time: 12/05/19  5:04 AM  Result Value Ref Range   Sodium 137 135 - 145 mmol/L   Potassium 4.2 3.5 - 5.1 mmol/L   Chloride 106 98 - 111 mmol/L   CO2 25 22 - 32 mmol/L   Glucose, Bld 132 (H) 70 - 99 mg/dL    Comment: Glucose reference range applies only to samples taken after fasting for at least 8  hours.   BUN 14 8 - 23 mg/dL   Creatinine, Ser 1.04 0.61 - 1.24 mg/dL   Calcium 7.9 (L) 8.9 - 10.3 mg/dL   GFR calc non Af Amer >60 >60 mL/min   GFR calc Af Amer >60 >60 mL/min   Anion gap 6 5 - 15    Comment: Performed at [facility], [place] [address], [place], [place] [address]  CBC     Status: Abnormal   Collection Time: 12/06/19  4:46 AM  Result Value Ref Range   WBC 5.8 4.0 - 10.5 K/uL   RBC 4.30 4.22 - 5.81 MIL/uL   Hemoglobin 8.0 (L) 13.0 - 17.0 g/dL    Comment: Reticulocyte Hemoglobin testing may be clinically indicated, consider ordering this additional test [id number]    HCT 29.3 (L) 39.0 - 52.0 %   MCV 68.1 (L) 80.0 - 100.0 fL   MCH 18.6 (L) 26.0 - 34.0 pg   MCHC 27.3 (L) 30.0 - 36.0 g/dL   RDW 24.1 (H) 11.5 - 15.5 %   Platelets 266 150 - 400 K/uL   nRBC 0.0 0.0 - 0.2 %    Comment: Performed at [facility], [place] [address], [place], [place] [id number]  Basic metabolic panel     Status: Abnormal   Collection Time: 12/06/19  4:46 AM  Result Value Ref Range   Sodium 140 135 - 145 mmol/L   Potassium 4.1 3.5 - 5.1 mmol/L   Chloride 106 98 - 111 mmol/L   CO2 30 22 - 32 mmol/L   Glucose, Bld 102 (H) 70 - 99 mg/dL    Comment: Glucose reference range applies only to samples taken after fasting for at least 8 hours.   BUN 16 8 - 23 mg/dL   Creatinine, Ser 1.02 0.61 - 1.24 mg/dL   Calcium 8.2 (L) 8.9 - 10.3 mg/dL   GFR calc non Af Amer >60 >60 mL/min   GFR calc Af Amer >60 >60 mL/min   Anion gap 4 (L) 5 - 15    Comment: Performed at [facility], [place] [address] [place], [place] [address]    No results found.  Past Medical History:  Diagnosis Date  . Arthritis    "probably in my knees and right shoulder" (11/22/2017)  . Duodenal ulcer 1964  . History of Guillain-Barre syndrome 2003  . History of hiatal hernia    per patient about 20 years ago  . History of hydronephrosis    W/ STRICTURE - not aware of this  .  History of melanoma excision    BACK-- 04-04-2007 W/ SLN BX RIGHT AXILL, right arm 2017  . History of nephrolithiasis    right kidney  . Microscopic hematuria    history of  . Prostate cancer ([place]) 05/30/13   gleason 3+3=6, volume 58 cc  . Skin cancer 04/04/2007; 2017   "back, right arm"    Past Surgical History:  Procedure Laterality Date  . CATARACT EXTRACTION W/ INTRAOCULAR LENS  IMPLANT, BILATERAL Bilateral   . HERNIA REPAIR  1990's  w/mesh, umbilical  . INGUINAL HERNIA REPAIR Bilateral 1990's  . JOINT REPLACEMENT    . MELANOMA EXCISION  04/04/2007; 2017   back W/ SLN BX RIGHT AXILL; right arm  . PROSTATE BIOPSY  01/02/13   benign, one small focus of atypical cells right core  . PROSTATE BIOPSY  05/30/13   Gleason 3+3=6, volume 49 cc  . RADIOACTIVE SEED IMPLANT N/A 08/08/2013   Procedure: RADIOACTIVE SEED IMPLANT;  Surgeon: Molli Hazard, MD;  Location: [facility];  Service: Urology;  Laterality: N/A;   72 SEED IMPLANTED NO SEEDS FOUND IN BLADDER  . TONSILLECTOMY AND ADENOIDECTOMY  childhood  . TOTAL HIP ARTHROPLASTY Right 01-06-2009  . TOTAL SHOULDER ARTHROPLASTY Right 11/22/2017  . TOTAL SHOULDER ARTHROPLASTY Right 11/22/2017   Procedure: TOTAL SHOULDER ARTHROPLASTY;  Surgeon: Hiram Gash, MD;  Location: [facility];  Service: Orthopedics;  Laterality: Right;    Social History   Socioeconomic History  . Marital status: Married    Spouse name: Not on file  . Number of children: Not on file  . Years of education: Not on file  . Highest education level: Not on file  Occupational History  . Not on file  Tobacco Use  . Smoking status: Former Smoker    Packs/day: 1.00    Years: 7.00    Pack years: 7.00    Types: Cigarettes    Quit date: 06/18/1966    Years since quitting: 53.5  . Smokeless tobacco: Never Used  Substance and Sexual Activity  . Alcohol use: Yes    Alcohol/week: 2.0 standard drinks    Types: 2 Glasses of wine per week  . Drug use:  No  . Sexual activity: Not Currently  Other Topics Concern  . Not on file  Social History Narrative  . Not on file   Social Determinants of Health   Financial Resource Strain:   . Difficulty of Paying Living Expenses:   Food Insecurity:   . Worried About Charity fundraiser in the Last Year:   . Arboriculturist in the Last Year:   Transportation Needs:   . Film/video editor (Medical):   Marland Kitchen Lack of Transportation (Non-Medical):   Physical Activity:   . Days of Exercise per Week:   . Minutes of Exercise per Session:   Stress:   . Feeling of Stress :   Social Connections:   . Frequency of Communication with Friends and Family:   . Frequency of Social Gatherings with Friends and Family:   . Attends Religious Services:   . Active Member of Clubs or Organizations:   . Attends Archivist Meetings:   Marland Kitchen Marital Status:   Intimate Partner Violence:   . Fear of Current or Ex-Partner:   . Emotionally Abused:   Marland Kitchen Physically Abused:   . Sexually Abused:     Family History  Problem Relation Age of Onset  . Cancer Father        prostate  . Cancer Brother        prostate    Current Facility-Administered Medications  Medication Dose Route Frequency Provider Last Rate Last Admin  . acetaminophen (TYLENOL) tablet 325-650 mg  325-650 mg Oral Q6H PRN Michael Boston, MD      . alum & mag hydroxide-simeth (MAALOX/MYLANTA) 200-200-20 MG/5ML suspension 30 mL  30 mL Oral [id number] PRN Leighton Ruff, MD      . enoxaparin (LOVENOX) injection 40 mg  40 mg Subcutaneous [id number] Leighton Ruff,  MD   40 mg at 12/05/19 0918  . feeding supplement (ENSURE SURGERY) liquid 237 mL  237 mL Oral BID BM Leighton Ruff, MD   [id number] mL at 12/05/19 0919  . gabapentin (NEURONTIN) capsule 300 mg  300 mg Oral BID Leighton Ruff, MD   [id number] mg at 12/05/19 2134  . HYDROmorphone (DILAUDID) injection 0.5 mg  0.5 mg Intravenous [id number] PRN Leighton Ruff, MD      . ondansetron [facility]) tablet 4 mg  4 mg Oral [id number] PRN  Leighton Ruff, MD       Or  . ondansetron [facility]) injection 4 mg  4 mg Intravenous [id number] PRN Leighton Ruff, MD      . saccharomyces boulardii (FLORASTOR) capsule 250 mg  250 mg Oral BID Leighton Ruff, MD   [id number] mg at 12/05/19 2134  . traMADol (ULTRAM) tablet 50 mg  50 mg Oral [id number] PRN Leighton Ruff, MD         No Known Allergies  Signed: Morton Peters, MD, FACS, MASCRS Gastrointestinal and Minimally Invasive Surgery  [facility] [address], [place],  [address] [phone] Fax [phone] Main/Paging  CONTACT INFORMATION: Weekday (9AM-5PM) concerns: Call [facility] main office at [phone] Weeknight (5PM-9AM) or Weekend/Holiday concerns: Check [email or web address] for General Surgery [facility] coverage (Please, do not use SecureChat as it is not reliable communication to surgeons for patient care)      12/06/2019, 7:32 AM

## 2019-12-06 NOTE — Progress Notes (Signed)
Note: Portions of this report may have been transcribed using voice recognition software. A sincere effort was made to ensure accuracy; however, inadvertent computerized transcription errors may be present.   Any transcriptional errors that result from this process are unintentional.        Aaron Hawkins  March 24, 1941 [id number]  Patient Care Team: Lawerance Cruel, MD as PCP - General (Family Medicine)  Patient feeling good.  Advance diet.  Most likely home later today.  Patient Active Problem List   Diagnosis Date Noted  . Colon cancer ([place]) 12/04/2019  . 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 Localized osteoarthritis of right shoulder 11/08/2017  . Prostate cancer [facility])     Past Medical History:  Diagnosis Date  . Arthritis    "probably in my knees and right shoulder" (11/22/2017)  . Duodenal ulcer 1964  . History of Guillain-Barre syndrome 2003  . History of hiatal hernia    per patient about 20 years ago  . History of hydronephrosis    W/ STRICTURE - not aware of this  . History of melanoma excision    BACK-- 04-04-2007 W/ SLN BX RIGHT AXILL, right arm 2017  . History of nephrolithiasis    right kidney  . Microscopic hematuria    history of  . Prostate cancer ([place]) 05/30/13   gleason 3+3=6, volume 58 cc  . Skin cancer 04/04/2007; 2017   "back, right arm"    Past Surgical History:  Procedure Laterality Date  . CATARACT EXTRACTION W/ INTRAOCULAR LENS  IMPLANT, BILATERAL Bilateral   . HERNIA REPAIR  [id number]   w/mesh, umbilical  . INGUINAL HERNIA REPAIR Bilateral 1990's  . JOINT REPLACEMENT    . MELANOMA EXCISION  04/04/2007; 2017   back W/ SLN BX RIGHT AXILL; right arm  . PROSTATE BIOPSY  01/02/13   benign, one small focus of atypical cells right core  . PROSTATE BIOPSY  05/30/13   Gleason 3+3=6, volume 49 cc  . RADIOACTIVE SEED IMPLANT N/A 08/08/2013   Procedure: RADIOACTIVE SEED IMPLANT;  Surgeon: Molli Hazard, MD;  Location: [facility];  Service: Urology;  Laterality: N/A;   56 SEED IMPLANTED NO SEEDS FOUND IN BLADDER  . TONSILLECTOMY AND ADENOIDECTOMY  childhood  . TOTAL HIP ARTHROPLASTY Right 01-06-2009  . TOTAL SHOULDER ARTHROPLASTY Right 11/22/2017  . TOTAL SHOULDER ARTHROPLASTY Right 11/22/2017   Procedure: TOTAL SHOULDER ARTHROPLASTY;  Surgeon: Hiram Gash, MD;  Location: [facility];  Service: Orthopedics;  Laterality: Right;    Social History   Socioeconomic History  . Marital status: Married    Spouse name: Not on file  . Number of children: Not on file  . Years of education: Not on file  . Highest education level: Not on file  Occupational History  . Not on file  Tobacco Use  . Smoking status: Former Smoker    Packs/day: 1.00    Years: 7.00    Pack years: 7.00    Types: Cigarettes    Quit date: 06/18/1966    Years since quitting: 53.5  . Smokeless tobacco: Never Used  Substance and Sexual Activity  . Alcohol use: Yes    Alcohol/week: 2.0 standard drinks    Types: 2 Glasses of wine per week  . Drug use: No  . Sexual activity: Not Currently  Other Topics Concern  . Not on file  Social History Narrative  . Not on file   Social Determinants of Health   Financial Resource Strain:   .  Difficulty of Paying Living Expenses:   Food Insecurity:   . Worried About Charity fundraiser in the Last Year:   . Arboriculturist in the Last Year:   Transportation Needs:   . Film/video editor (Medical):   Marland Kitchen Lack of Transportation (Non-Medical):   Physical Activity:   . Days of Exercise per Week:   . Minutes of Exercise per Session:   Stress:   . Feeling of Stress :   Social Connections:   . Frequency of Communication with Friends and Family:   . Frequency of Social Gatherings with Friends and Family:   . Attends Religious Services:   . Active Member of Clubs or Organizations:   . Attends Archivist Meetings:   Marland Kitchen Marital Status:   Intimate Partner Violence:   . Fear of  Current or Ex-Partner:   . Emotionally Abused:   Marland Kitchen Physically Abused:   . Sexually Abused:     Family History  Problem Relation Age of Onset  . Cancer Father        prostate  . Cancer Brother        prostate    Current Facility-Administered Medications  Medication Dose Route Frequency Provider Last Rate Last Admin  . acetaminophen (TYLENOL) tablet 325-650 mg  325-650 mg Oral Q6H PRN Michael Boston, MD      . alum & mag hydroxide-simeth (MAALOX/MYLANTA) 200-200-20 MG/5ML suspension 30 mL  30 mL Oral [id number] PRN Leighton Ruff, MD      . enoxaparin (LOVENOX) injection 40 mg  40 mg Subcutaneous [id number] Leighton Ruff, MD   40 mg at 12/05/19 [id number]  . feeding supplement (ENSURE SURGERY) liquid 237 mL  237 mL Oral BID BM Leighton Ruff, MD   [id number] mL at 12/05/19 0919  . gabapentin (NEURONTIN) capsule 300 mg  300 mg Oral BID Leighton Ruff, MD   [id number] mg at 12/05/19 2134  . HYDROmorphone (DILAUDID) injection 0.5 mg  0.5 mg Intravenous [id number] PRN Leighton Ruff, MD      . ondansetron [facility]) tablet 4 mg  4 mg Oral [id number] PRN Leighton Ruff, MD       Or  . ondansetron [facility]) injection 4 mg  4 mg Intravenous [id number] PRN Leighton Ruff, MD      . saccharomyces boulardii (FLORASTOR) capsule 250 mg  250 mg Oral BID Leighton Ruff, MD   [id number] mg at 12/05/19 2134  . traMADol (ULTRAM) tablet 50 mg  50 mg Oral [id number] PRN Leighton Ruff, MD         No Known Allergies  BP 123/62 (BP Location: Left Arm)   Pulse 77   Temp 98.2 F (36.8 C) (Oral)   Resp 15   Ht 5\' 8"  (1.727 m)   Wt 75.9 kg   SpO2 97%   BMI 25.44 kg/m   No results found.

## 2019-12-06 NOTE — Progress Notes (Signed)
Patient stable, tolerating diet, exercise, and pain controlled. Discharged to home with wife. Patient understood all d/c instructions.

## 2019-12-06 NOTE — Progress Notes (Signed)
Patient c/o feeling dizzy and light headed, BP checked 89/61, Dr. Johney Maine paged with new orders made

## 2019-12-09 LAB — SURGICAL PATHOLOGY

## 2019-12-11 NOTE — Progress Notes (Signed)
Spoke with patient regarding referral from Dr. Marcello Moores s/p partial colectomy on 5/19.  He states he is doing great from surgery having to only take a couple of Tylenol nothing more.  I offered him original appointment for 6/17 but he desires to come in sooner to meet with oncology.  I have scheduled him for Thursday 6/3 at 1:45 with Cira Rue NP/Dr. Burr Medico..  I explained my role as nurse navigator and that I would meet him and his wife at their appointment.  He is aware of our location and I asked that they arrive by 1:30 for check in.  He verbalized an understanding and was appreciative of the call.

## 2019-12-18 ENCOUNTER — Other Ambulatory Visit: Payer: Self-pay

## 2019-12-19 ENCOUNTER — Inpatient Hospital Stay: Payer: Medicare Other | Attending: Nurse Practitioner | Admitting: Nurse Practitioner

## 2019-12-19 ENCOUNTER — Inpatient Hospital Stay: Payer: Medicare Other

## 2019-12-19 ENCOUNTER — Other Ambulatory Visit: Payer: Self-pay

## 2019-12-19 ENCOUNTER — Encounter: Payer: Self-pay | Admitting: Nurse Practitioner

## 2019-12-19 ENCOUNTER — Telehealth: Payer: Self-pay | Admitting: Nurse Practitioner

## 2019-12-19 VITALS — BP 150/70 | HR 77 | Temp 97.7°F | Resp 20 | Ht 68.0 in | Wt 168.8 lb

## 2019-12-19 DIAGNOSIS — D649 Anemia, unspecified: Secondary | ICD-10-CM | POA: Diagnosis not present

## 2019-12-19 DIAGNOSIS — C183 Malignant neoplasm of hepatic flexure: Secondary | ICD-10-CM

## 2019-12-19 DIAGNOSIS — Z85038 Personal history of other malignant neoplasm of large intestine: Secondary | ICD-10-CM | POA: Insufficient documentation

## 2019-12-19 DIAGNOSIS — Z8042 Family history of malignant neoplasm of prostate: Secondary | ICD-10-CM | POA: Diagnosis not present

## 2019-12-19 DIAGNOSIS — Z85820 Personal history of malignant melanoma of skin: Secondary | ICD-10-CM | POA: Diagnosis not present

## 2019-12-19 DIAGNOSIS — Z87891 Personal history of nicotine dependence: Secondary | ICD-10-CM | POA: Diagnosis not present

## 2019-12-19 DIAGNOSIS — I714 Abdominal aortic aneurysm, without rupture: Secondary | ICD-10-CM | POA: Diagnosis not present

## 2019-12-19 DIAGNOSIS — Z8546 Personal history of malignant neoplasm of prostate: Secondary | ICD-10-CM

## 2019-12-19 LAB — CBC WITH DIFFERENTIAL (CANCER CENTER ONLY)
Abs Immature Granulocytes: 0.01 10*3/uL (ref 0.00–0.07)
Basophils Absolute: 0.1 10*3/uL (ref 0.0–0.1)
Basophils Relative: 2 %
Eosinophils Absolute: 0.2 10*3/uL (ref 0.0–0.5)
Eosinophils Relative: 5 %
HCT: 30.3 % — ABNORMAL LOW (ref 39.0–52.0)
Hemoglobin: 8.5 g/dL — ABNORMAL LOW (ref 13.0–17.0)
Immature Granulocytes: 0 %
Lymphocytes Relative: 30 %
Lymphs Abs: 1.3 10*3/uL (ref 0.7–4.0)
MCH: 18.8 pg — ABNORMAL LOW (ref 26.0–34.0)
MCHC: 28.1 g/dL — ABNORMAL LOW (ref 30.0–36.0)
MCV: 67.2 fL — ABNORMAL LOW (ref 80.0–100.0)
Monocytes Absolute: 0.5 10*3/uL (ref 0.1–1.0)
Monocytes Relative: 12 %
Neutro Abs: 2.3 10*3/uL (ref 1.7–7.7)
Neutrophils Relative %: 51 %
Platelet Count: 355 10*3/uL (ref 150–400)
RBC: 4.51 MIL/uL (ref 4.22–5.81)
RDW: 23.2 % — ABNORMAL HIGH (ref 11.5–15.5)
WBC Count: 4.4 10*3/uL (ref 4.0–10.5)
nRBC: 0 % (ref 0.0–0.2)

## 2019-12-19 LAB — CMP (CANCER CENTER ONLY)
ALT: 7 U/L (ref 0–44)
AST: 9 U/L — ABNORMAL LOW (ref 15–41)
Albumin: 3.5 g/dL (ref 3.5–5.0)
Alkaline Phosphatase: 86 U/L (ref 38–126)
Anion gap: 7 (ref 5–15)
BUN: 19 mg/dL (ref 8–23)
CO2: 28 mmol/L (ref 22–32)
Calcium: 8.6 mg/dL — ABNORMAL LOW (ref 8.9–10.3)
Chloride: 107 mmol/L (ref 98–111)
Creatinine: 1.17 mg/dL (ref 0.61–1.24)
GFR, Est AFR Am: >60 mL/min (ref >60)
GFR, Estimated: 59 mL/min — ABNORMAL LOW (ref >60)
Glucose, Bld: 119 mg/dL — ABNORMAL HIGH (ref 70–99)
Potassium: 4 mmol/L (ref 3.5–5.1)
Sodium: 142 mmol/L (ref 135–145)
Total Bilirubin: 0.3 mg/dL (ref 0.3–1.2)
Total Protein: 6.4 g/dL — ABNORMAL LOW (ref 6.5–8.1)

## 2019-12-19 NOTE — Progress Notes (Signed)
Met with patient and his wife Bethena Roys at initial medical oncology appointment today with Cira Rue NP/Dr. Burr Medico.  They were given my card with my direct number and encouraged to call if any questions or concerns arise.  They understand my role as nurse navigator.

## 2019-12-19 NOTE — Progress Notes (Addendum)
[place]  [phone] Fax:([phone]) [place] Note   Patient Care Team: Lawerance Cruel, MD as PCP - General (Family Medicine) Leighton Ruff, MD as Consulting Physician (General Surgery) Early, Arvilla Meres, MD as Consulting Physician (Vascular Surgery) Jonnie Finner, RN as Oncology Nurse Navigator Alla Feeling, NP as Nurse Practitioner (Nurse Practitioner) Truitt Merle, MD as Consulting Physician (Hematology) 12/19/2019  CHIEF COMPLAINTS/PURPOSE OF CONSULTATION:  Colon cancer, referred by Dr. Leighton Ruff of colorectal surgery   HISTORY OF PRESENTING ILLNESS:  Aaron Hawkins 79 y.o. male with past medical history of prostate cancer and melanoma (previously followed by Dr. Julien Nordmann) is here because of newly diagnosed colon cancer. He was found to have symptomatic anemia Hgb 7.4 on 09/03/19 during work up for AAA. He subsequently received IV Feraheme x2 on 09/27/19 and 10/04/19 and was referred for upper endoscopy which was negative. Colonoscopy on 10/16/19 by Dr. Cristina Gong showed a mass at the hepatic flexure which was biopsied. Pathology showed invasive moderately differentiated adenocarcinoma. Staging CT CAP showed annular lesion at the hepatic junction of the ascending colon and hepatic flexure compatible with biopsy proven primary colon cancer. Additionally there was a mildly prominent right mesenteric LN suspicious for nodal metastasis and tiny pulmonary nodules in the lower lobes up to 3 mm. No other evidence of metastatic disease. His known abdominal aortic aneurysm measured 5.6 cm. He underwent robotic assisted right colectomy by Dr. Marcello Moores on 12/04/19. Path showed moderately differentiated adenocarcinoma spanning 4.7 cm invading through muscularis propria into pericolonic soft tissue. Resection margins negative, 0/24 LNs positive. Lymphovascular and perineural invasion were not identified. There is preserved expression of the major MMR proteins.  This was staged pT3N0. He had mild orthostatic hypotension that resolved with fluid bolus, otherwise uneventful post-op course. He was discharged on 12/06/19.   Socially, he is married with 3 adult healthy children. Lives with his spouse who is here with him today. He is active at home and independent with ADLs. 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 He quit smoking before children were born, mostly smoked in college. Drinks alcohol socially. Denies other drug use. Family history is positive for sister diagnosed with breast cancer in her 37's, brother and father had prostate cancer.   Today, he presents with his wife. He feels well. He feels better than he did before surgery but was also anemic then. Eating and drinking well. Bowels becoming more formed, 1 per day. Denies n/v/c, bloody or black stool, or abdominal pain. He will f/u with Dr. Marcello Moores next week. Denies fever, chills, cough, chest pain, dyspnea    MEDICAL HISTORY:  Past Medical History:  Diagnosis Date  . Arthritis    "probably in my knees and right shoulder" (11/22/2017)  . Duodenal ulcer 1964  . History of Guillain-Barre syndrome 2003  . History of hiatal hernia    per patient about 20 years ago  . History of hydronephrosis    W/ STRICTURE - not aware of this  . History of melanoma excision    BACK-- 04-04-2007 W/ SLN BX RIGHT AXILL, right arm 2017  . History of nephrolithiasis    right kidney  . Microscopic hematuria    history of  . Prostate cancer ([place]) 05/30/13   gleason 3+3=6, volume 58 cc  . Skin cancer 04/04/2007; 2017   "back, right arm"    SURGICAL HISTORY: Past Surgical History:  Procedure Laterality Date  . CATARACT EXTRACTION  W/ INTRAOCULAR LENS  IMPLANT, BILATERAL Bilateral   . HERNIA REPAIR  1610'R   w/mesh, umbilical  . INGUINAL HERNIA REPAIR Bilateral 1990's  . JOINT REPLACEMENT    . MELANOMA EXCISION  04/04/2007; 2017   back W/ SLN BX RIGHT AXILL; right arm  .  PROSTATE BIOPSY  01/02/13   benign, one small focus of atypical cells right core  . PROSTATE BIOPSY  05/30/13   Gleason 3+3=6, volume 49 cc  . RADIOACTIVE SEED IMPLANT N/A 08/08/2013   Procedure: RADIOACTIVE SEED IMPLANT;  Surgeon: Molli Hazard, MD;  Location: [facility];  Service: Urology;  Laterality: N/A;   32 SEED IMPLANTED NO SEEDS FOUND IN BLADDER  . TONSILLECTOMY AND ADENOIDECTOMY  childhood  . TOTAL HIP ARTHROPLASTY Right 01-06-2009  . TOTAL SHOULDER ARTHROPLASTY Right 11/22/2017  . TOTAL SHOULDER ARTHROPLASTY Right 11/22/2017   Procedure: TOTAL SHOULDER ARTHROPLASTY;  Surgeon: Hiram Gash, MD;  Location: [facility];  Service: Orthopedics;  Laterality: Right;    SOCIAL HISTORY: Social History   Socioeconomic History  . Marital status: Married    Spouse name: Not on file  . Number of children: Not on file  . Years of education: Not on file  . Highest education level: Not on file  Occupational History  . Not on file  Tobacco Use  . Smoking status: Former Smoker    Packs/day: 1.00    Years: 7.00    Pack years: 7.00    Types: Cigarettes    Quit date: 06/18/1966    Years since quitting: 53.5  . Smokeless tobacco: Never Used  Substance and Sexual Activity  . Alcohol use: Yes    Alcohol/week: 2.0 standard drinks    Types: 2 Glasses of wine per week  . Drug use: No  . Sexual activity: Not Currently  Other Topics Concern  . Not on file  Social History Narrative  . Not on file   Social Determinants of Health   Financial Resource Strain:   . Difficulty of Paying Living Expenses:   Food Insecurity:   . Worried About Charity fundraiser in the Last Year:   . Arboriculturist in the Last Year:   Transportation Needs:   . Film/video editor (Medical):   Marland Kitchen Lack of Transportation (Non-Medical):   Physical Activity:   . Days of Exercise per Week:   . Minutes of Exercise per Session:   Stress:   . Feeling of Stress :   Social Connections:   .  Frequency of Communication with Friends and Family:   . Frequency of Social Gatherings with Friends and Family:   . Attends Religious Services:   . Active Member of Clubs or Organizations:   . Attends Archivist Meetings:   Marland Kitchen Marital Status:   Intimate Partner Violence:   . Fear of Current or Ex-Partner:   . Emotionally Abused:   Marland Kitchen Physically Abused:   . Sexually Abused:     FAMILY HISTORY: Family History  Problem Relation Age of Onset  . Cancer Father        prostate  . Cancer Brother        prostate    ALLERGIES:  has No Known Allergies.  MEDICATIONS:  Current Outpatient Medications  Medication Sig Dispense Refill  . acetaminophen (TYLENOL) 500 MG tablet Take 500 mg by mouth every 6 (six) hours as needed for moderate pain.     . Multiple Vitamins-Minerals (PRESERVISION AREDS 2) CAPS Take 1  capsule by mouth 2 (two) times daily.    . Polyvinyl Alcohol-Povidone (REFRESH OP) Place 2 drops into both eyes daily as needed (dry eyes).      No current facility-administered medications for this visit.    REVIEW OF SYSTEMS:   Constitutional: Denies fevers, chills or abnormal night sweats Eyes: Denies blurriness of vision, double vision or watery eyes Ears, nose, mouth, throat, and face: Denies mucositis or sore throat Respiratory: Denies cough, dyspnea or wheezes Cardiovascular: Denies palpitation, chest discomfort or lower extremity swelling Gastrointestinal:  Denies nausea,vomiting, constipation, hematochezia, heartburn or change in bowel habits (+) soft stools  Skin: Denies abnormal skin rashes Lymphatics: Denies new lymphadenopathy or easy bruising Neurological:Denies numbness, tingling or new weaknesses Behavioral/Psych: Mood is stable, no new changes  All other systems were reviewed with the patient and are negative.  PHYSICAL EXAMINATION: ECOG PERFORMANCE STATUS: 1 - Symptomatic but completely ambulatory  Vitals:   12/19/19 1337  BP: (!) 150/70  Pulse: 77    Resp: 20  Temp: 97.7 F (36.5 C)  SpO2: 100%   Filed Weights   12/19/19 1337  Weight: 168 lb 12.8 oz (76.6 kg)    GENERAL:alert, no distress and comfortable SKIN: no rash  EYES: sclera clear LUNGS: clear with normal breathing effort HEART: regular rate & rhythm, pulsation over the abdomen. No lower extremity edema ABDOMEN:abdomen soft, non-tender and normal bowel sounds. Surgical incisions on the abdomen and suprapubic area closed well healed  PSYCH: alert & oriented x 3 with fluent speech NEURO: no focal motor/sensory deficits  LABORATORY DATA:  I have reviewed the data as listed CBC Latest Ref Rng & Units 12/19/2019 12/06/2019 12/05/2019  WBC 4.0 - 10.5 K/uL 4.4 5.8 7.9  Hemoglobin 13.0 - 17.0 g/dL 8.5(L) 8.0(L) 8.3(L)  Hematocrit 39.0 - 52.0 % 30.3(L) 29.3(L) 30.9(L)  Platelets 150 - 400 K/uL 355 266 275   CMP Latest Ref Rng & Units 12/19/2019 12/06/2019 12/05/2019  Glucose 70 - 99 mg/dL 119(H) 102(H) 132(H)  BUN 8 - 23 mg/dL '19 16 14  '$ Creatinine 0.61 - 1.24 mg/dL 1.17 1.02 1.04  Sodium 135 - 145 mmol/L 142 140 137  Potassium 3.5 - 5.1 mmol/L 4.0 4.1 4.2  Chloride 98 - 111 mmol/L 107 106 106  CO2 22 - 32 mmol/L '28 30 25  '$ Calcium 8.9 - 10.3 mg/dL 8.6(L) 8.2(L) 7.9(L)  Total Protein 6.5 - 8.1 g/dL 6.4(L) - -  Total Bilirubin 0.3 - 1.2 mg/dL 0.3 - -  Alkaline Phos 38 - 126 U/L 86 - -  AST 15 - 41 U/L 9(L) - -  ALT 0 - 44 U/L 7 - -    RADIOGRAPHIC STUDIES: I have personally reviewed the radiological images as listed and agreed with the findings in the report. No results found.  ASSESSMENT & PLAN: 79 yo male   1. Adenocarcinoma of the hepatic flexure, G2, pT3N0M0 stage IIA, MMR normal  -we reviewed his medical record in detail with the patient and his wife. He was found to have symptomatic anemia with exertional dyspnea when Hgb 7.4, He was referred for colonoscopy which showed biopsy-proven adenocarcinoma at the hepatic flexure.  -He underwent right colectomy per Dr.  Marcello Moores on 12/04/19. He had mostly uneventful postop course. Bowels improving, normal diet, no pain. He is recovering well.  -We reviewed his surgical path which shows moderately differentiated adenocarcinoma involving the muscularis propria, negative margins, 0/32 positive LNs. No LVI or PNI -We discussed this is stage IIA colon cancer, likely cured by  surgery. Due to no high risk features and his elderly age, adjuvant chemotherapy is not recommended.  -We discussed his recurrence risk in the future is around 20%. I outlined the surveillance plan which includes a physical exam and lab test (including CBC, CMP and CEA) every 3 months for the first 2 years, then every 6-12 months, colonoscopy in one year, and surveilliance CT scan annually for 1 or 2 years then as clinically indicated. He agrees to the plan  -Labs today show anemia persistent anemia Hgb 8.5, CMP stable. Iron studies and CEA are pending  -we reviewed s/sx of recurrence such as change in bowel habits, rectal bleeding, abdominal pain or bloating, unexplained weight loss or fatigue. He will watch carefully.  -I recommend healthy diet and lifestyle.   -He will f/u with Dr. Marcello Moores next week then likely again in a few months.  -He will return to [place] for lab and surveillance f/u in 6 months.   2. Anemia  -Found during work up for AAA in 08/2019, he was symptomatic with exertional dyspnea.  -s/p IV Feraheme x2, symptoms improved  -Hgb 8.5 today, iron studies pending  -if ferritin remains low <50 I will likely recommend additional IV Feraheme   3. H/o prostate cancer and melanoma  -he had prostate cancer in 2008, s/p seed implant radiation  -melanoma removed from back and right arm   3. Genetics  -Due to his personal h/o prostate cancer, melanoma, and now colon cancer, he qualifies for genetic testing. He also has family history of cancer - sister with breast diagnosed in her 93's, brother and father had prostate cancer  -His colon cancer is  MMR normal, not likely lynch syndrome  -I explained regardless of the genetic test report, his children should adhere to cancer screening guidelines. I reviewed the new colon cancer screening guidelines recommend to start at age 43, or 25 years younger than first degree relative at diagnosis. His youngest is age 51. The understand recommendations   4. AAA -He has vascular surgeon and plans to undergo aneurysm repair as soon, once he is cleared by Dr. Marcello Moores.   PLAN: -Colonoscopy, imaging, path reviewed -Begin colon cancer surveillance  -Lab today, will f/u iron studies and CEA  -Refer to genetics  -F/u with Dr. Marcello Moores next week as scheduled and likely again in 3 months -Lab and f/u with me in 6 months   Orders Placed This Encounter  Procedures  . CBC with Differential (Cancer Center Only)    Standing Status:   Standing    Number of Occurrences:   20    Standing Expiration Date:   12/18/2020  . CMP ([place] only)    Standing Status:   Standing    Number of Occurrences:   20    Standing Expiration Date:   12/18/2020  . CEA (IN HOUSE-[facility])    Standing Status:   Standing    Number of Occurrences:   20    Standing Expiration Date:   12/18/2020  . Iron and TIBC    Standing Status:   Standing    Number of Occurrences:   20    Standing Expiration Date:   12/18/2020  . Ferritin    Standing Status:   Standing    Number of Occurrences:   20    Standing Expiration Date:   12/18/2020  . Ambulatory referral to Genetics    Referral Priority:   Routine    Referral Type:   Consultation  Referral Reason:   Specialty Services Required    Number of Visits Requested:   1    All questions were answered. The patient knows to call the clinic with any problems, questions or concerns.    Alla Feeling, NP 12/19/2019   Addendum  I have seen the patient, examined him. I agree with the assessment and and plan and have edited the notes.   I discussed his surgical pathology results with patient and  his wife in details.  His staging scan was negative for metastasis.  He had T3N0M0 stage II disease, no high risk features, I do not recommend adjuvant chemotherapy.  We reviewed colon cancer surveillance, plan to see him back in 6 months, will split his F/U with Dr. Marcello Moores.   Truitt Merle  12/19/2019

## 2019-12-19 NOTE — Telephone Encounter (Signed)
Scheduled per 6/3 los. Printed avs and calendar for pt. 

## 2019-12-20 ENCOUNTER — Other Ambulatory Visit: Payer: Self-pay | Admitting: Nurse Practitioner

## 2019-12-20 ENCOUNTER — Telehealth: Payer: Self-pay | Admitting: Nurse Practitioner

## 2019-12-20 ENCOUNTER — Telehealth: Payer: Self-pay

## 2019-12-20 DIAGNOSIS — D508 Other iron deficiency anemias: Secondary | ICD-10-CM

## 2019-12-20 DIAGNOSIS — D509 Iron deficiency anemia, unspecified: Secondary | ICD-10-CM | POA: Insufficient documentation

## 2019-12-20 LAB — IRON AND TIBC
Iron: 11 ug/dL — ABNORMAL LOW (ref 42–163)
Saturation Ratios: 3 % — ABNORMAL LOW (ref 20–55)
TIBC: 351 ug/dL (ref 202–409)
UIBC: 340 ug/dL (ref 117–376)

## 2019-12-20 LAB — FERRITIN: Ferritin: 7 ng/mL — ABNORMAL LOW (ref 24–336)

## 2019-12-20 LAB — CEA (IN HOUSE-CHCC): CEA (CHCC-In House): <1 ng/mL (ref 0.00–5.00)

## 2019-12-20 NOTE — Telephone Encounter (Signed)
TC to pt per Cira Rue NP to let him know that his iron is still low and Lacie recommend for him to have IV Feraheme in 1 and 2 weeks, then repeat his labs in 3 months like he has had it before If he agrees. Patient agreed. Sent schedule message to arrange these appointments for him. I then let him know that after he completes the infusions, to please start a daily multivitamin with iron, watch for dark stools and constipation. Patient verbalized understanding.

## 2019-12-20 NOTE — Telephone Encounter (Signed)
Scheduled appt per 6/4 sch message - pt aware of appts added.   

## 2019-12-25 ENCOUNTER — Other Ambulatory Visit: Payer: Self-pay

## 2019-12-27 ENCOUNTER — Inpatient Hospital Stay: Payer: Medicare Other

## 2019-12-27 ENCOUNTER — Other Ambulatory Visit: Payer: Self-pay

## 2019-12-27 VITALS — BP 122/75 | HR 64 | Temp 97.6°F | Resp 18

## 2019-12-27 DIAGNOSIS — D508 Other iron deficiency anemias: Secondary | ICD-10-CM

## 2019-12-27 DIAGNOSIS — Z85038 Personal history of other malignant neoplasm of large intestine: Secondary | ICD-10-CM | POA: Diagnosis not present

## 2019-12-27 MED ORDER — SODIUM CHLORIDE 0.9 % IV SOLN
Freq: Once | INTRAVENOUS | Status: AC
  Filled 2019-12-27: qty 250

## 2019-12-27 MED ORDER — SODIUM CHLORIDE 0.9 % IV SOLN
510.0000 mg | Freq: Once | INTRAVENOUS | Status: AC
  Administered 2019-12-27: 510 mg via INTRAVENOUS
  Filled 2019-12-27: qty 17

## 2019-12-27 NOTE — Patient Instructions (Signed)

## 2019-12-30 ENCOUNTER — Telehealth: Payer: Self-pay

## 2019-12-30 NOTE — Telephone Encounter (Signed)
TC to pt per Cira Rue NP to let him know that his baseline tumor marker is normal. Lacie will monitor with surveillance visits in the future. Patient verbalized understanding. Patient also requested to know what his HGB level was. I let him know that his HGB was 8.5 patient verbalized understanding. No further problems or concerns at this time.

## 2020-01-02 ENCOUNTER — Ambulatory Visit: Payer: Medicare Other | Admitting: Nurse Practitioner

## 2020-01-03 ENCOUNTER — Other Ambulatory Visit: Payer: Self-pay

## 2020-01-03 ENCOUNTER — Inpatient Hospital Stay: Payer: Medicare Other

## 2020-01-03 VITALS — BP 122/80 | HR 72 | Temp 98.0°F | Resp 18

## 2020-01-03 DIAGNOSIS — Z85038 Personal history of other malignant neoplasm of large intestine: Secondary | ICD-10-CM | POA: Diagnosis not present

## 2020-01-03 DIAGNOSIS — D508 Other iron deficiency anemias: Secondary | ICD-10-CM

## 2020-01-03 MED ORDER — SODIUM CHLORIDE 0.9 % IV SOLN
510.0000 mg | Freq: Once | INTRAVENOUS | Status: AC
  Administered 2020-01-03: 510 mg via INTRAVENOUS
  Filled 2020-01-03: qty 510

## 2020-01-03 MED ORDER — SODIUM CHLORIDE 0.9 % IV SOLN
Freq: Once | INTRAVENOUS | Status: AC
  Filled 2020-01-03: qty 250

## 2020-01-03 NOTE — Patient Instructions (Signed)

## 2020-01-09 ENCOUNTER — Inpatient Hospital Stay (HOSPITAL_BASED_OUTPATIENT_CLINIC_OR_DEPARTMENT_OTHER): Payer: Medicare Other | Admitting: Genetic Counselor

## 2020-01-09 ENCOUNTER — Inpatient Hospital Stay: Payer: Medicare Other

## 2020-01-09 ENCOUNTER — Other Ambulatory Visit: Payer: Self-pay

## 2020-01-09 ENCOUNTER — Encounter: Payer: Self-pay | Admitting: Genetic Counselor

## 2020-01-09 ENCOUNTER — Other Ambulatory Visit: Payer: Self-pay | Admitting: Genetic Counselor

## 2020-01-09 DIAGNOSIS — C183 Malignant neoplasm of hepatic flexure: Secondary | ICD-10-CM

## 2020-01-09 DIAGNOSIS — Z803 Family history of malignant neoplasm of breast: Secondary | ICD-10-CM

## 2020-01-09 DIAGNOSIS — C61 Malignant neoplasm of prostate: Secondary | ICD-10-CM

## 2020-01-09 DIAGNOSIS — Z85820 Personal history of malignant melanoma of skin: Secondary | ICD-10-CM | POA: Diagnosis not present

## 2020-01-09 DIAGNOSIS — Z8042 Family history of malignant neoplasm of prostate: Secondary | ICD-10-CM | POA: Diagnosis not present

## 2020-01-09 DIAGNOSIS — Z85038 Personal history of other malignant neoplasm of large intestine: Secondary | ICD-10-CM | POA: Diagnosis not present

## 2020-01-09 LAB — CBC WITH DIFFERENTIAL (CANCER CENTER ONLY)
Abs Immature Granulocytes: 0.01 10*3/uL (ref 0.00–0.07)
Basophils Absolute: 0 10*3/uL (ref 0.0–0.1)
Basophils Relative: 1 %
Eosinophils Absolute: 0.1 10*3/uL (ref 0.0–0.5)
Eosinophils Relative: 2 %
HCT: 40 % (ref 39.0–52.0)
Hemoglobin: 11.3 g/dL — ABNORMAL LOW (ref 13.0–17.0)
Immature Granulocytes: 0 %
Lymphocytes Relative: 43 %
Lymphs Abs: 2.2 10*3/uL (ref 0.7–4.0)
MCH: 20.4 pg — ABNORMAL LOW (ref 26.0–34.0)
MCHC: 28.3 g/dL — ABNORMAL LOW (ref 30.0–36.0)
MCV: 72.2 fL — ABNORMAL LOW (ref 80.0–100.0)
Monocytes Absolute: 0.6 10*3/uL (ref 0.1–1.0)
Monocytes Relative: 11 %
Neutro Abs: 2.2 10*3/uL (ref 1.7–7.7)
Neutrophils Relative %: 43 %
Platelet Count: 234 10*3/uL (ref 150–400)
RBC: 5.54 MIL/uL (ref 4.22–5.81)
RDW: 31.8 % — ABNORMAL HIGH (ref 11.5–15.5)
WBC Count: 5.2 10*3/uL (ref 4.0–10.5)
nRBC: 0 % (ref 0.0–0.2)

## 2020-01-09 LAB — CMP (CANCER CENTER ONLY)
ALT: 10 U/L (ref 0–44)
AST: 14 U/L — ABNORMAL LOW (ref 15–41)
Albumin: 3.9 g/dL (ref 3.5–5.0)
Alkaline Phosphatase: 85 U/L (ref 38–126)
Anion gap: 8 (ref 5–15)
BUN: 14 mg/dL (ref 8–23)
CO2: 29 mmol/L (ref 22–32)
Calcium: 8.9 mg/dL (ref 8.9–10.3)
Chloride: 103 mmol/L (ref 98–111)
Creatinine: 1.15 mg/dL (ref 0.61–1.24)
GFR, Est AFR Am: >60 mL/min (ref >60)
GFR, Estimated: >60 mL/min (ref >60)
Glucose, Bld: 94 mg/dL (ref 70–99)
Potassium: 4.4 mmol/L (ref 3.5–5.1)
Sodium: 140 mmol/L (ref 135–145)
Total Bilirubin: 0.6 mg/dL (ref 0.3–1.2)
Total Protein: 6.9 g/dL (ref 6.5–8.1)

## 2020-01-09 LAB — GENETIC SCREENING ORDER

## 2020-01-09 NOTE — Progress Notes (Signed)
REFERRING PROVIDER: Truitt Merle, MD [address] [place],  [place] [address]  PRIMARY PROVIDER:  Lawerance Cruel, MD  PRIMARY REASON FOR VISIT:  1. 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 Prostate cancer ([place])   3. History of melanoma   4. Family history of prostate cancer   5. Family history of breast cancer      HISTORY OF PRESENT ILLNESS:   Mr. Magowan, a 79 y.o. male, was seen for a [place] cancer genetics consultation at the request of Dr. Burr Medico due to a personal and family history of cancer.  Mr. Buckalew presents to clinic today to discuss the possibility of a hereditary predisposition to cancer, genetic testing, and to further clarify his future cancer risks, as well as potential cancer risks for family members.   Mr. Deere has a personal history of colon cancer, prostate cancer, and melanoma. Most recently, in May 2021 at the age of 43, Mr. 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 MMR protein expression was preserved. The treatment plan included a right partial colectomy, completed on 12/04/2019. He was diagnosed with prostate cancer in 2008 at the age of 23 and the Gleason score was 3+3=6. Mr. Trivedi was also diagnosed with melanoma on his back in 2008.     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 Arthritis    "probably in my knees and right shoulder" (11/22/2017)  . Duodenal ulcer 1964  . Family history of breast cancer   . Family history of prostate cancer   . History of  Guillain-Barre syndrome 2003  . History of hiatal hernia    per patient about 20 years ago  . History of hydronephrosis    W/ STRICTURE - not aware of this  . History of melanoma   . History of melanoma excision    BACK-- 04-04-2007 W/ SLN BX RIGHT AXILL, right arm 2017  . History of nephrolithiasis    right kidney  . Microscopic hematuria    history of  . Prostate cancer ([place]) 05/30/13   gleason 3+3=6, volume 58 cc  . Skin cancer 04/04/2007; 2017   "back, right arm"    Past Surgical History:  Procedure Laterality Date  . CATARACT EXTRACTION W/ INTRAOCULAR LENS  IMPLANT, BILATERAL Bilateral   . HERNIA REPAIR  6283'M   w/mesh, umbilical  . INGUINAL HERNIA REPAIR Bilateral 1990's  . JOINT REPLACEMENT    . MELANOMA EXCISION  04/04/2007; 2017   back W/ SLN BX RIGHT AXILL; right arm  . PROSTATE BIOPSY  01/02/13   benign, one small focus of atypical cells right core  . PROSTATE BIOPSY  05/30/13   Gleason 3+3=6, volume 49 cc  . RADIOACTIVE SEED IMPLANT N/A 08/08/2013   Procedure: RADIOACTIVE SEED IMPLANT;  Surgeon: Molli Hazard, MD;  Location: [facility];  Service: Urology;  Laterality: N/A;   16 SEED IMPLANTED NO SEEDS FOUND IN BLADDER  . TONSILLECTOMY AND ADENOIDECTOMY  childhood  . TOTAL HIP ARTHROPLASTY Right 01-06-2009  . TOTAL SHOULDER ARTHROPLASTY  Right 11/22/2017  . TOTAL SHOULDER ARTHROPLASTY Right 11/22/2017   Procedure: TOTAL SHOULDER ARTHROPLASTY;  Surgeon: Hiram Gash, MD;  Location: [facility];  Service: Orthopedics;  Laterality: Right;    Social History   Socioeconomic History  . Marital status: Married    Spouse name: Not on file  . Number of children: 3  . Years of education: Not on file  . Highest education level: Not on file  Occupational History  . Occupation: Retired     Comment: Regulatory affairs officer  Tobacco Use  . Smoking status: Former Smoker    Packs/day: 1.00    Years: 7.00    Pack years: 7.00    Types: Cigarettes    Quit date:  06/18/1966    Years since quitting: 53.5  . Smokeless tobacco: Never Used  Vaping Use  . Vaping Use: Never used  Substance and Sexual Activity  . Alcohol use: Yes    Alcohol/week: 2.0 standard drinks    Types: 2 Glasses of wine per week  . Drug use: No  . Sexual activity: Not Currently  Other Topics Concern  . Not on file  Social History Narrative  . Not on file   Social Determinants of Health   Financial Resource Strain:   . Difficulty of Paying Living Expenses:   Food Insecurity:   . Worried About Charity fundraiser in the Last Year:   . Arboriculturist in the Last Year:   Transportation Needs:   . Film/video editor (Medical):   Marland Kitchen Lack of Transportation (Non-Medical):   Physical Activity:   . Days of Exercise per Week:   . Minutes of Exercise per Session:   Stress:   . Feeling of Stress :   Social Connections:   . Frequency of Communication with Friends and Family:   . Frequency of Social Gatherings with Friends and Family:   . Attends Religious Services:   . Active Member of Clubs or Organizations:   . Attends Archivist Meetings:   Marland Kitchen Marital Status:      FAMILY HISTORY:  We obtained a detailed, 4-generation family history.  Significant diagnoses are listed below: Family History  Problem Relation Age of Onset  . Prostate cancer Father        dx. in his 9s  . Prostate cancer Brother        dx. in his mid-60s  . Breast cancer Sister 24       dx. in her 31s  . Dementia Mother   . Cancer Maternal Uncle        dx. in his 34s; unknown primary  . Cancer Maternal Uncle        dx. in his 50s; unknown primary  . Cancer Cousin        dx. >50; blood cancer   Mr. Rehm has two sons (ages 109 and 30) and one daughter (age 63). He has one brother and one sister. His sister had a history of breast cancer diagnosed in her 35s and died in her 94s. His brother was diagnosed with prostate cancer in his mid-43s, which was treated with brachytherapy and hormone  therapy.  Mr. Word mother died at the age of 24 with dementia and did not have a history of cancer. He has two maternal aunts and three maternal uncles. Two of his uncles were diagnosed with cancer in their 58s (unknown primaries). He also had a maternal first cousin who was diagnosed with a blood cancer  when he was older than 28. Mr. Bischoff maternal grandparents both died in their 28s and did not have cancer.  Mr. Copes father died at the age of 60 and had a history of prostate cancer diagnosed in his 21s. Mr. Linhares had one paternal aunt and three paternal uncles. All died older than 39 and did not have cancer. His paternal grandfather died in his 31s, and his paternal grandfather died older than 52. Neither had cancer to his knowledge.  Mr. Bergland is unaware of previous family history of genetic testing for hereditary cancer risks. Patient's maternal ancestors are of [place] descent, and paternal ancestors are of [place] descent. There is no reported Ashkenazi Jewish ancestry. There is no known consanguinity.  GENETIC COUNSELING ASSESSMENT: Mr. Tillis is a 79 y.o. male with a personal history of prostate cancer, melanoma, and colon cancer as well as a family history of prostate cancer and breast cancer, which is somewhat suggestive of a hereditary cancer syndrome and predisposition to cancer. We, therefore, discussed and recommended the following at today's visit.   DISCUSSION:  We discussed that approximately 5-10% of cancer is hereditary. Most cases of hereditary prostate cancer and breast cancer are associated with the BRCA1 and BRCA2 genes, although there are other genes that can be associated with hereditary breast and/or prostate cancer syndromes. While there are genes that can be associated with hereditary colon cancer syndromes, such as Lynch syndrome, Mr. Bednarczyk had preserved MMR protein staining on his colon cancer. This suggests a low likelihood that he has Lynch syndrome. We discussed  that testing is beneficial for several reasons, including knowing about other cancer risks, identifying potential screening and risk-reduction options that may be appropriate, and to understand if other family members could be at risk for cancer and allow them to undergo genetic testing.   We reviewed the characteristics, features and inheritance patterns of hereditary cancer syndromes. We also discussed genetic testing, including the appropriate family members to test, the process of testing, insurance coverage and turn-around-time for results. We discussed the implications of a negative, positive and/or variant of uncertain significant result. We recommended Mr. Hayner pursue genetic testing for the [facility] Common Hereditary Cancers panel + Melanoma panel.  The Common Hereditary Cancers Panel offered by [facility] includes sequencing and/or deletion duplication testing of the following 48 genes: APC, ATM, AXIN2, BARD1, BMPR1A, BRCA1, BRCA2, BRIP1, CDH1, CDK4, CDKN2A (p14ARF), CDKN2A (p16INK4a), CHEK2, CTNNA1, DICER1, EPCAM (Deletion/duplication testing only), GREM1 (promoter region deletion/duplication testing only), KIT, MEN1, MLH1, MSH2, MSH3, MSH6, MUTYH, NBN, NF1, NHTL1, PALB2, PDGFRA, PMS2, POLD1, POLE, PTEN, RAD50, RAD51C, RAD51D, RNF43, SDHB, SDHC, SDHD, SMAD4, SMARCA4. STK11, TP53, TSC1, TSC2, and VHL.  The following genes are evaluated for sequence changes only: SDHA and HOXB13 c.251G>A variant only. The Melanoma panel offered by [facility] includes sequencing and/or deletion/duplication testing of the following 9 genes: BAP1, BRCA2, CDK4, CDKN2A (p14ARF), CDKN2A (p16INK4a), MITF, POT1, PTEN, RB1, TERT, and TP53.   Based on Mr. Cerami personal and family history of cancer, he meets medical criteria for genetic testing. Despite that he meets criteria, he may still have an out of pocket cost. We discussed that if his out of pocket cost for testing is over $100, the laboratory will reach out to let him  know. If the out of pocket cost of testing is less than $100 he will be billed by the genetic testing laboratory.   We discussed that some people do not want to undergo genetic testing due to fear of genetic discrimination.  A federal law called the Genetic Information Non-Discrimination Act (GINA) of 2008 helps protect individuals against genetic discrimination based on their genetic test results.  It impacts both health insurance and employment.  With health insurance, it protects against increased premiums, being kicked off insurance or being forced to take a test in order to be insured.  For employment it protects against hiring, firing and promoting decisions based on genetic test results.  Health status due to a cancer diagnosis is not protected under GINA.  Additionally, life, disability, and long-term care insurance is not protected under GINA.   PLAN: After considering the risks, benefits, and limitations, Mr. Mulvey provided informed consent to pursue genetic testing and the blood sample was sent to [facility] for analysis of the Common Hereditary Cancers Panel + Melanoma Panel. Results should be available within approximately two-three weeks' time, at which point they will be disclosed by telephone to Mr. Nicolson, as will any additional recommendations warranted by these results. Mr. Standley will receive a summary of his genetic counseling visit and a copy of his results once available. This information will also be available in Epic.   Mr. Stines questions were answered to his satisfaction today. Our contact information was provided should additional questions or concerns arise. Thank you for the referral and allowing [place] to share in the care of your patient.   Clint Guy, [place], [facility] Licensed, Certified Dispensing [email or web address] Phone: [phone]  The patient was seen for a total of 35 minutes in face-to-face genetic counseling.  This patient was discussed  with Drs. Magrinat, Lindi Adie and/or Burr Medico who agrees with the above.    _______________________________________________________________________ For Office Staff:  Number of people involved in session: 1 Was an Intern/ student involved with case: no

## 2020-01-13 NOTE — Progress Notes (Signed)
[facility] [place], [address] AT [address] [place] [address] Phone: [phone] Fax: [phone]      Your procedure is scheduled on Wednesday July 7  Report to [facility] Main Entrance "A" at 0830 A.M., and check in at the Admitting office.  Call this number if you have problems the morning of surgery:  [phone]  Call [phone] if you have any questions prior to your surgery date Monday-Friday 8am-4pm    Remember:  Do not eat or drink after midnight the night before your surgery    Take these medicines the morning of surgery with A SIP OF WATER  acetaminophen (TYLENOL)  If needed Eye drops if needed  As of today, STOP taking any Aspirin (unless otherwise instructed by your surgeon) and Aspirin containing products, Aleve, Naproxen, Ibuprofen, Motrin, Advil, Goody's, BC's, all herbal medications, fish oil, and all vitamins.                      Do not wear jewelry            Do not wear lotions, powders, colognes, or deodorant.            Men may shave face and neck.            Do not bring valuables to the hospital.            [facility] is not responsible for any belongings or valuables.  Do NOT Smoke (Tobacco/Vapping) or drink Alcohol 24 hours prior to your procedure If you use a CPAP at night, you may bring all equipment for your overnight stay.   Contacts, glasses, dentures or bridgework may not be worn into surgery.      For patients admitted to the hospital, discharge time will be determined by your treatment team.   Patients discharged the day of surgery will not be allowed to drive home, and someone needs to stay with them for 24 hours.    Special instructions:   [place]- Preparing For Surgery  Before surgery, you can play an important role. Because skin is not sterile, your skin needs to be as free of germs as possible. You can reduce the number of germs on your  skin by washing with CHG (chlorahexidine gluconate) Soap before surgery.  CHG is an antiseptic cleaner which kills germs and bonds with the skin to continue killing germs even after washing.    Oral Hygiene is also important to reduce your risk of infection.  Remember - BRUSH YOUR TEETH THE MORNING OF SURGERY WITH YOUR REGULAR TOOTHPASTE  Please do not use if you have an allergy to CHG or antibacterial soaps. If your skin becomes reddened/irritated stop using the CHG.  Do not shave (including legs and underarms) for at least 48 hours prior to first CHG shower. It is OK to shave your face.  Please follow these instructions carefully.   1. Shower the NIGHT BEFORE SURGERY and the MORNING OF SURGERY with CHG Soap.   2. If you chose to wash your hair, wash your hair first as usual with your normal shampoo.  3. After you shampoo, rinse your hair and body thoroughly to remove the shampoo.  4. Use CHG as you would any other liquid soap. You can apply CHG directly to the skin and wash gently with a scrungie or a clean washcloth.   5. Apply the CHG Soap to  your body ONLY FROM THE NECK DOWN.  Do not use on open wounds or open sores. Avoid contact with your eyes, ears, mouth and genitals (private parts). Wash Face and genitals (private parts)  with your normal soap.   6. Wash thoroughly, paying special attention to the area where your surgery will be performed.  7. Thoroughly rinse your body with warm water from the neck down.  8. DO NOT shower/wash with your normal soap after using and rinsing off the CHG Soap.  9. Pat yourself dry with a CLEAN TOWEL.  10. Wear CLEAN PAJAMAS to bed the night before surgery, wear comfortable clothes the morning of surgery  11. Place CLEAN SHEETS on your bed the night of your first shower and DO NOT SLEEP WITH PETS.   Day of Surgery:   Do not apply any deodorants/lotions.  Please wear clean clothes to the hospital/surgery center.   Remember to brush your teeth  WITH YOUR REGULAR TOOTHPASTE.   Please read over the following fact sheets that you were given.

## 2020-01-14 ENCOUNTER — Encounter (HOSPITAL_COMMUNITY): Payer: Self-pay

## 2020-01-14 ENCOUNTER — Other Ambulatory Visit: Payer: Self-pay

## 2020-01-14 ENCOUNTER — Encounter (HOSPITAL_COMMUNITY)
Admission: RE | Admit: 2020-01-14 | Discharge: 2020-01-14 | Disposition: A | Discharge status: Home / Self Care | Payer: Medicare Other | Source: Ambulatory Visit | Attending: Vascular Surgery | Admitting: Vascular Surgery

## 2020-01-14 DIAGNOSIS — Z01812 Encounter for preprocedural laboratory examination: Secondary | ICD-10-CM | POA: Diagnosis present

## 2020-01-14 HISTORY — DX: Abdominal aortic aneurysm, without rupture: I71.4

## 2020-01-14 HISTORY — DX: Abdominal aortic aneurysm, without rupture, unspecified: I71.40

## 2020-01-14 HISTORY — DX: Anemia, unspecified: D64.9

## 2020-01-14 LAB — COMPREHENSIVE METABOLIC PANEL
ALT: 14 U/L (ref 0–44)
AST: 19 U/L (ref 15–41)
Albumin: 4.1 g/dL (ref 3.5–5.0)
Alkaline Phosphatase: 76 U/L (ref 38–126)
Anion gap: 8 (ref 5–15)
BUN: 19 mg/dL (ref 8–23)
CO2: 28 mmol/L (ref 22–32)
Calcium: 8.8 mg/dL — ABNORMAL LOW (ref 8.9–10.3)
Chloride: 104 mmol/L (ref 98–111)
Creatinine, Ser: 1.16 mg/dL (ref 0.61–1.24)
GFR calc Af Amer: >60 mL/min (ref >60)
GFR calc non Af Amer: 60 mL/min — ABNORMAL LOW (ref >60)
Glucose, Bld: 98 mg/dL (ref 70–99)
Potassium: 4.3 mmol/L (ref 3.5–5.1)
Sodium: 140 mmol/L (ref 135–145)
Total Bilirubin: 0.5 mg/dL (ref 0.3–1.2)
Total Protein: 6.8 g/dL (ref 6.5–8.1)

## 2020-01-14 LAB — CBC
HCT: 44.3 % (ref 39.0–52.0)
Hemoglobin: 12.5 g/dL — ABNORMAL LOW (ref 13.0–17.0)
MCH: 21.5 pg — ABNORMAL LOW (ref 26.0–34.0)
MCHC: 28.2 g/dL — ABNORMAL LOW (ref 30.0–36.0)
MCV: 76.2 fL — ABNORMAL LOW (ref 80.0–100.0)
Platelets: 215 10*3/uL (ref 150–400)
RBC: 5.81 MIL/uL (ref 4.22–5.81)
WBC: 4.9 10*3/uL (ref 4.0–10.5)
nRBC: 0 % (ref 0.0–0.2)

## 2020-01-14 LAB — TYPE AND SCREEN
ABO/RH(D): A POS
Antibody Screen: NEGATIVE

## 2020-01-14 LAB — URINALYSIS, ROUTINE W REFLEX MICROSCOPIC
Bilirubin Urine: NEGATIVE
Glucose, UA: NEGATIVE mg/dL
Hgb urine dipstick: NEGATIVE
Ketones, ur: NEGATIVE mg/dL
Leukocytes,Ua: NEGATIVE
Nitrite: NEGATIVE
Protein, ur: NEGATIVE mg/dL
Specific Gravity, Urine: 1.024 (ref 1.005–1.030)
pH: 5 (ref 5.0–8.0)

## 2020-01-14 LAB — PROTIME-INR
INR: 1.1 (ref 0.8–1.2)
Prothrombin Time: 13.4 seconds (ref 11.4–15.2)

## 2020-01-14 LAB — SURGICAL PCR SCREEN
MRSA, PCR: NEGATIVE
Staphylococcus aureus: NEGATIVE

## 2020-01-14 LAB — APTT: aPTT: 34 seconds (ref 24–36)

## 2020-01-14 MED ORDER — CHLORHEXIDINE GLUCONATE CLOTH 2 % EX PADS: 6.0000 | MEDICATED_PAD | Freq: Once | CUTANEOUS | Status: DC

## 2020-01-14 NOTE — Progress Notes (Signed)
PCP - [place] Cardiologist - NA  Chest x-ray - NA EKG - 2/21 Stress Test - NA ECHO - NA Cardiac Cath - NA    sAspirin Instructions:   TO STOP  COVID TEST- FOR 7/6   Anesthesia review: EKG  Patient denies shortness of breath, fever, cough and chest pain at PAT appointment   All instructions explained to the patient, with a verbal understanding of the material. Patient agrees to go over the instructions while at home for a better understanding. Patient also instructed to self quarantine after being tested for COVID-19. The opportunity to ask questions was provided.

## 2020-01-15 NOTE — Anesthesia Preprocedure Evaluation (Addendum)
Anesthesia Evaluation  Patient identified by MRN, date of birth, ID band Patient awake    Reviewed: Allergy & Precautions, H&P , NPO status , Patient's Chart, lab work & pertinent test results  Airway Mallampati: II   Neck ROM: full    Dental   Pulmonary former smoker,    breath sounds clear to auscultation       Cardiovascular negative cardio ROS   Rhythm:regular Rate:Normal     Neuro/Psych    GI/Hepatic hiatal hernia, PUD,   Endo/Other    Renal/GU      Musculoskeletal  (+) Arthritis ,   Abdominal   Peds  Hematology   Anesthesia Other Findings   Reproductive/Obstetrics                            Anesthesia Physical Anesthesia Plan  ASA: III  Anesthesia Plan: General   Post-op Pain Management:    Induction: Intravenous  PONV Risk Score and Plan: 2 and Ondansetron, Dexamethasone and Treatment may vary due to age or medical condition  Airway Management Planned: Oral ETT  Additional Equipment: Arterial line  Intra-op Plan:   Post-operative Plan: Extubation in OR  Informed Consent: I have reviewed the patients History and Physical, chart, labs and discussed the procedure including the risks, benefits and alternatives for the proposed anesthesia with the patient or authorized representative who has indicated his/her understanding and acceptance.       Plan Discussed with: CRNA, Anesthesiologist and Surgeon  Anesthesia Plan Comments: (PAT note written 01/15/2020 by Myra Gianotti, PA-C. )       Anesthesia Quick Evaluation

## 2020-01-15 NOTE — Progress Notes (Signed)
Anesthesia Chart Review:  Case: [id number] Date/Time: 01/22/20 1015   Procedure: ABDOMINAL AORTIC ENDOVASCULAR STENT GRAFT (N/A )   Anesthesia type: General   Pre-op diagnosis: ABDOMINAL AORTIC ANEURYSM   Location: [facility] OR ROOM 36 / [facility]   Surgeons: [name], MD      DISCUSSION: Patient is a 79 year old male scheduled for the above procedure. Surgery was initially scheduled for 09/11/19, but was canceled for anemia (Hgb 7.4). He subsequently underwent colonoscopy and 2 iron transfusions.  Hepatic flexure mass biopsied with pathology showing adenocarcinoma.Marland KitchenHe is s/p XI robot assisted right colectomy on 12/04/19 by Dr. Marcello Moores.   Other history includes former smoker (7 pack years, quit 06/18/66), post-operative N/V, AAA, colon cancer (s/p right colectomy 12/04/19), Guillain-Barre Syndrome (2003), melanoma (s/p melanoma excision: back 2008, right arm 2017), prostate cancer (2014, s/p radioactive seed implant 08/08/13), hiatal hernia, anemia.   HGB 12.5. Presurgical COVID-19 test is scheduled for 01/21/20. Anesthesia team to evaluate on the day of surgery.   VS: BP (!) 153/86   Pulse 64   Temp 36.5 C (Oral)   Resp 18   Ht 5\' 8"  (1.727 m)   Wt 74.3 kg   SpO2 100%   BMI 24.89 kg/m   PROVIDERS: Lawerance Cruel, MD is PCP Leighton Ruff, MD is general surgeon Truitt Merle, MD is HEM-ONC. Seen by Cira Rue, NP on 12/19/19. She notes stage IIA colon cancer, likely cured by surgery. Adjuvant chemotherapy not recommended.  He is being referred to genetics given multiple cancers.    LABS: Labs reviewed: Acceptable for surgery. (all labs ordered are listed, but only abnormal results are displayed)  Labs Reviewed  CBC - Abnormal; Notable for the following components:      Result Value   Hemoglobin 12.5 (*)    MCV 76.2 (*)    MCH 21.5 (*)    MCHC 28.2 (*)    All other components within normal limits  COMPREHENSIVE METABOLIC PANEL - Abnormal; Notable for the following components:   Calcium  8.8 (*)    GFR calc non Af Amer 60 (*)    All other components within normal limits  SURGICAL PCR SCREEN  APTT  PROTIME-INR  URINALYSIS, ROUTINE W REFLEX MICROSCOPIC  TYPE AND SCREEN    IMAGES: CT Chest/Abdomen/Pelvis 10/31/2019 (ordered by Ronald Lobo, MD) IMPRESSION: 1. Annular lesion at the junction of the ascending colon and hepatic flexure, compatible with reported history of primary colonic malignancy. 2. Mildly prominent rounded right mesenteric lymph node near the colonic lesion, suspicious for local nodal metastasis. 3. Tiny pulmonary nodules in the lower lobes, largest 3 mm, recommend attention on follow-up chest CT in 3 months. 4. No additional potential findings of metastatic disease in the chest, abdomen or pelvis. 5. Infrarenal 5.6 cm abdominal aortic aneurysm. Greater than 5.5 cm Abdominal Aortic Aneurysm (ICD10-I71.9). Vascular surgery referral/consultation is recommended due to increased risk for rupture. This recommendation follows ACR consensus guidelines: White Paper of the ACR Incidental Findings Committee II on Vascular Findings. J Am Coll Radiol 2013; 10:789-794. 6. Moderate sigmoid diverticulosis. 7. Aortic Atherosclerosis (ICD10-I70.0).   EKG: 09/03/2019 Rate 76 bpm Sinus rhythm with Premature atrial complexes Incomplete right bundle branch block Borderline ECG PAC's are new since prior tracing Confirmed by Cristopher Peru [phone]) on 09/03/2019 5:17:31 PM   CV: He denied prior stress test, echo, cardiac cath.   Past Medical History:  Diagnosis Date  . AAA (abdominal aortic aneurysm) ([place])   . Anemia   .  Arthritis    "probably in my knees and right shoulder" (11/22/2017)  . Duodenal ulcer 1964  . Family history of breast cancer   . Family history of prostate cancer   . History of Guillain-Barre syndrome 2003  . History of hiatal hernia    per patient about 20 years ago  . History of hydronephrosis    W/ STRICTURE - not aware of this  .  History of melanoma   . History of melanoma excision    BACK-- 04-04-2007 W/ SLN BX RIGHT AXILL, right arm 2017  . History of nephrolithiasis    right kidney  . Microscopic hematuria    history of  . Prostate cancer ([place]) 05/30/13   gleason 3+3=6, volume 58 cc  . Skin cancer 04/04/2007; 2017   "back, right arm"    Past Surgical History:  Procedure Laterality Date  . CATARACT EXTRACTION W/ INTRAOCULAR LENS  IMPLANT, BILATERAL Bilateral   . COLON SURGERY     removed intestine  colon ca  . HERNIA REPAIR  1117'B   w/mesh, umbilical  . INGUINAL HERNIA REPAIR Bilateral 1990's  . JOINT REPLACEMENT    . MELANOMA EXCISION  04/04/2007; 2017   back W/ SLN BX RIGHT AXILL; right arm  . PROSTATE BIOPSY  01/02/13   benign, one small focus of atypical cells right core  . PROSTATE BIOPSY  05/30/13   Gleason 3+3=6, volume 49 cc  . RADIOACTIVE SEED IMPLANT N/A 08/08/2013   Procedure: RADIOACTIVE SEED IMPLANT;  Surgeon: Molli Hazard, MD;  Location: [facility];  Service: Urology;  Laterality: N/A;   74 SEED IMPLANTED NO SEEDS FOUND IN BLADDER  . TONSILLECTOMY AND ADENOIDECTOMY  childhood  . TOTAL HIP ARTHROPLASTY Right 01-06-2009  . TOTAL SHOULDER ARTHROPLASTY Right 11/22/2017  . TOTAL SHOULDER ARTHROPLASTY Right 11/22/2017   Procedure: TOTAL SHOULDER ARTHROPLASTY;  Surgeon: Hiram Gash, MD;  Location: [facility];  Service: Orthopedics;  Laterality: Right;    MEDICATIONS: . acetaminophen (TYLENOL) 500 MG tablet  . Multiple Vitamins-Minerals (PRESERVISION AREDS 2) CAPS  . Polyvinyl Alcohol-Povidone (REFRESH OP)   No current facility-administered medications for this encounter.     Myra Gianotti, PA-C Surgical Short Stay/Anesthesiology [facility] Phone [phone] [facility] Phone [phone] 01/15/2020 3:58 PM

## 2020-01-21 ENCOUNTER — Other Ambulatory Visit (HOSPITAL_COMMUNITY)
Admission: RE | Admit: 2020-01-21 | Discharge: 2020-01-21 | Disposition: A | Discharge status: Home / Self Care | Payer: Medicare Other | Source: Ambulatory Visit | Attending: Vascular Surgery | Admitting: Vascular Surgery

## 2020-01-21 LAB — SARS CORONAVIRUS 2 (TAT 6-24 HRS): SARS Coronavirus 2: NEGATIVE

## 2020-01-22 ENCOUNTER — Encounter (HOSPITAL_COMMUNITY)
Admission: RE | Disposition: A | Discharge status: Home / Self Care | Payer: Self-pay | Source: Ambulatory Visit | Attending: Vascular Surgery

## 2020-01-22 ENCOUNTER — Inpatient Hospital Stay (HOSPITAL_COMMUNITY): Payer: Medicare Other | Admitting: Anesthesiology

## 2020-01-22 ENCOUNTER — Other Ambulatory Visit: Payer: Self-pay

## 2020-01-22 ENCOUNTER — Inpatient Hospital Stay (HOSPITAL_COMMUNITY): Payer: Medicare Other

## 2020-01-22 ENCOUNTER — Inpatient Hospital Stay (HOSPITAL_COMMUNITY): Payer: Medicare Other | Admitting: Vascular Surgery

## 2020-01-22 ENCOUNTER — Inpatient Hospital Stay (HOSPITAL_COMMUNITY)
Admission: RE | Admit: 2020-01-22 | Discharge: 2020-01-23 | DRG: 269 | Disposition: A | Discharge status: Home / Self Care | Payer: Medicare Other | Attending: Vascular Surgery | Admitting: Vascular Surgery

## 2020-01-22 DIAGNOSIS — G610 Guillain-Barre syndrome: Secondary | ICD-10-CM | POA: Diagnosis present

## 2020-01-22 DIAGNOSIS — I714 Abdominal aortic aneurysm, without rupture, unspecified: Secondary | ICD-10-CM | POA: Diagnosis present

## 2020-01-22 DIAGNOSIS — R21 Rash and other nonspecific skin eruption: Secondary | ICD-10-CM | POA: Diagnosis not present

## 2020-01-22 DIAGNOSIS — Z79899 Other long term (current) drug therapy: Secondary | ICD-10-CM | POA: Diagnosis not present

## 2020-01-22 DIAGNOSIS — Z8679 Personal history of other diseases of the circulatory system: Secondary | ICD-10-CM

## 2020-01-22 DIAGNOSIS — Z96649 Presence of unspecified artificial hip joint: Secondary | ICD-10-CM | POA: Diagnosis present

## 2020-01-22 DIAGNOSIS — Y712 Prosthetic and other implants, materials and accessory cardiovascular devices associated with adverse incidents: Secondary | ICD-10-CM | POA: Diagnosis not present

## 2020-01-22 DIAGNOSIS — M1990 Unspecified osteoarthritis, unspecified site: Secondary | ICD-10-CM | POA: Diagnosis not present

## 2020-01-22 DIAGNOSIS — T82310A Breakdown (mechanical) of aortic (bifurcation) graft (replacement), initial encounter: Secondary | ICD-10-CM | POA: Diagnosis not present

## 2020-01-22 DIAGNOSIS — Z85820 Personal history of malignant melanoma of skin: Secondary | ICD-10-CM | POA: Diagnosis not present

## 2020-01-22 DIAGNOSIS — Z87442 Personal history of urinary calculi: Secondary | ICD-10-CM | POA: Diagnosis not present

## 2020-01-22 DIAGNOSIS — Z87891 Personal history of nicotine dependence: Secondary | ICD-10-CM

## 2020-01-22 DIAGNOSIS — Z8711 Personal history of peptic ulcer disease: Secondary | ICD-10-CM | POA: Diagnosis not present

## 2020-01-22 DIAGNOSIS — Z20822 Contact with and (suspected) exposure to covid-19: Secondary | ICD-10-CM | POA: Diagnosis not present

## 2020-01-22 DIAGNOSIS — I517 Cardiomegaly: Secondary | ICD-10-CM | POA: Diagnosis not present

## 2020-01-22 DIAGNOSIS — Z8546 Personal history of malignant neoplasm of prostate: Secondary | ICD-10-CM

## 2020-01-22 DIAGNOSIS — Z8042 Family history of malignant neoplasm of prostate: Secondary | ICD-10-CM

## 2020-01-22 HISTORY — PX: ULTRASOUND GUIDANCE FOR VASCULAR ACCESS: SHX6516

## 2020-01-22 HISTORY — PX: ABDOMINAL AORTIC ENDOVASCULAR STENT GRAFT: SHX5707

## 2020-01-22 LAB — BASIC METABOLIC PANEL
Anion gap: 8 (ref 5–15)
BUN: 13 mg/dL (ref 8–23)
CO2: 25 mmol/L (ref 22–32)
Calcium: 8 mg/dL — ABNORMAL LOW (ref 8.9–10.3)
Chloride: 107 mmol/L (ref 98–111)
Creatinine, Ser: 0.95 mg/dL (ref 0.61–1.24)
GFR calc Af Amer: >60 mL/min (ref >60)
GFR calc non Af Amer: >60 mL/min (ref >60)
Glucose, Bld: 110 mg/dL — ABNORMAL HIGH (ref 70–99)
Potassium: 3.6 mmol/L (ref 3.5–5.1)
Sodium: 140 mmol/L (ref 135–145)

## 2020-01-22 LAB — CBC
HCT: 38.9 % — ABNORMAL LOW (ref 39.0–52.0)
Hemoglobin: 11.2 g/dL — ABNORMAL LOW (ref 13.0–17.0)
MCH: 21.7 pg — ABNORMAL LOW (ref 26.0–34.0)
MCHC: 28.8 g/dL — ABNORMAL LOW (ref 30.0–36.0)
MCV: 75.2 fL — ABNORMAL LOW (ref 80.0–100.0)
Platelets: 124 10*3/uL — ABNORMAL LOW (ref 150–400)
RBC: 5.17 MIL/uL (ref 4.22–5.81)
WBC: 3.9 10*3/uL — ABNORMAL LOW (ref 4.0–10.5)
nRBC: 0 % (ref 0.0–0.2)

## 2020-01-22 LAB — MAGNESIUM: Magnesium: 1.7 mg/dL (ref 1.7–2.4)

## 2020-01-22 LAB — PROTIME-INR
INR: 1.2 (ref 0.8–1.2)
Prothrombin Time: 14.8 seconds (ref 11.4–15.2)

## 2020-01-22 LAB — APTT: aPTT: 37 seconds — ABNORMAL HIGH (ref 24–36)

## 2020-01-22 SURGERY — INSERTION, ENDOVASCULAR STENT GRAFT, AORTA, ABDOMINAL
Anesthesia: General | Site: Groin

## 2020-01-22 MED ORDER — ACETAMINOPHEN 325 MG PO TABS
325.0000 mg | ORAL_TABLET | ORAL | Status: DC | PRN
  Administered 2020-01-22 – 2020-01-23 (×2): 650 mg via ORAL
  Filled 2020-01-22 (×2): qty 2

## 2020-01-22 MED ORDER — SODIUM CHLORIDE 0.9 % IV SOLN
INTRAVENOUS | Status: AC
  Filled 2020-01-22: qty 1.2

## 2020-01-22 MED ORDER — CHLORHEXIDINE GLUCONATE 0.12 % MT SOLN: 15.0000 mL | Freq: Once | OROMUCOSAL | Status: AC

## 2020-01-22 MED ORDER — LABETALOL HCL 5 MG/ML IV SOLN: 10.0000 mg | INTRAVENOUS | Status: DC | PRN

## 2020-01-22 MED ORDER — ONDANSETRON HCL 4 MG/2ML IJ SOLN
INTRAMUSCULAR | Status: DC | PRN
  Administered 2020-01-22: 4 mg via INTRAVENOUS

## 2020-01-22 MED ORDER — PANTOPRAZOLE SODIUM 40 MG PO TBEC
40.0000 mg | DELAYED_RELEASE_TABLET | Freq: Every day | ORAL | Status: DC
  Administered 2020-01-22 – 2020-01-23 (×2): 40 mg via ORAL
  Filled 2020-01-22 (×2): qty 1

## 2020-01-22 MED ORDER — 0.9 % SODIUM CHLORIDE (POUR BTL) OPTIME
TOPICAL | Status: DC | PRN
  Administered 2020-01-22: 1000 mL

## 2020-01-22 MED ORDER — ROCURONIUM BROMIDE 10 MG/ML (PF) SYRINGE
PREFILLED_SYRINGE | INTRAVENOUS | Status: DC | PRN
  Administered 2020-01-22: 60 mg via INTRAVENOUS
  Administered 2020-01-22: 10 mg via INTRAVENOUS

## 2020-01-22 MED ORDER — ORAL CARE MOUTH RINSE: 15.0000 mL | Freq: Once | OROMUCOSAL | Status: AC

## 2020-01-22 MED ORDER — GUAIFENESIN-DM 100-10 MG/5ML PO SYRP: 15.0000 mL | ORAL_SOLUTION | ORAL | Status: DC | PRN

## 2020-01-22 MED ORDER — DOCUSATE SODIUM 100 MG PO CAPS
100.0000 mg | ORAL_CAPSULE | Freq: Every day | ORAL | Status: DC
  Administered 2020-01-23: 100 mg via ORAL
  Filled 2020-01-22: qty 1

## 2020-01-22 MED ORDER — OXYCODONE HCL 5 MG/5ML PO SOLN: 5.0000 mg | Freq: Once | ORAL | Status: DC | PRN

## 2020-01-22 MED ORDER — EPHEDRINE SULFATE 50 MG/ML IJ SOLN
INTRAMUSCULAR | Status: DC | PRN
Start: 2020-01-22 — End: 2020-01-22
  Administered 2020-01-22: 10 mg via INTRAVENOUS

## 2020-01-22 MED ORDER — IODIXANOL 320 MG/ML IV SOLN
INTRAVENOUS | Status: DC | PRN
  Administered 2020-01-22: 80 mL via INTRA_ARTERIAL

## 2020-01-22 MED ORDER — OCUVITE-LUTEIN PO CAPS
1.0000 | ORAL_CAPSULE | Freq: Every day | ORAL | Status: DC
  Filled 2020-01-22: qty 1

## 2020-01-22 MED ORDER — LACTATED RINGERS IV SOLN: INTRAVENOUS | Status: DC

## 2020-01-22 MED ORDER — ROSUVASTATIN CALCIUM 5 MG PO TABS
10.0000 mg | ORAL_TABLET | Freq: Every day | ORAL | Status: DC
  Administered 2020-01-22 – 2020-01-23 (×2): 10 mg via ORAL
  Filled 2020-01-22 (×2): qty 2

## 2020-01-22 MED ORDER — POTASSIUM CHLORIDE CRYS ER 20 MEQ PO TBCR: 20.0000 meq | EXTENDED_RELEASE_TABLET | Freq: Every day | ORAL | Status: DC | PRN

## 2020-01-22 MED ORDER — PHENYLEPHRINE HCL-NACL 10-0.9 MG/250ML-% IV SOLN
INTRAVENOUS | Status: DC | PRN
Start: 2020-01-22 — End: 2020-01-22
  Administered 2020-01-22: 15 ug/min via INTRAVENOUS

## 2020-01-22 MED ORDER — LACTATED RINGERS IV SOLN: INTRAVENOUS | Status: DC | PRN

## 2020-01-22 MED ORDER — CEFAZOLIN SODIUM-DEXTROSE 2-4 GM/100ML-% IV SOLN
2.0000 g | Freq: Three times a day (TID) | INTRAVENOUS | Status: AC
  Administered 2020-01-22 – 2020-01-23 (×2): 2 g via INTRAVENOUS
  Filled 2020-01-22 (×2): qty 100

## 2020-01-22 MED ORDER — PHENYLEPHRINE HCL (PRESSORS) 10 MG/ML IV SOLN
INTRAVENOUS | Status: DC | PRN
Start: 2020-01-22 — End: 2020-01-22
  Administered 2020-01-22 (×3): 80 ug via INTRAVENOUS

## 2020-01-22 MED ORDER — HEPARIN SODIUM (PORCINE) 5000 UNIT/ML IJ SOLN: 5000.0000 [IU] | Freq: Three times a day (TID) | INTRAMUSCULAR | Status: DC

## 2020-01-22 MED ORDER — FENTANYL CITRATE (PF) 100 MCG/2ML IJ SOLN: 25.0000 ug | INTRAMUSCULAR | Status: DC | PRN

## 2020-01-22 MED ORDER — SODIUM CHLORIDE 0.9 % IV SOLN: INTRAVENOUS | Status: DC

## 2020-01-22 MED ORDER — ASPIRIN EC 81 MG PO TBEC
81.0000 mg | DELAYED_RELEASE_TABLET | Freq: Every day | ORAL | Status: DC
  Administered 2020-01-23: 81 mg via ORAL
  Filled 2020-01-22: qty 1

## 2020-01-22 MED ORDER — METOPROLOL TARTRATE 5 MG/5ML IV SOLN: 2.0000 mg | INTRAVENOUS | Status: DC | PRN

## 2020-01-22 MED ORDER — OXYCODONE-ACETAMINOPHEN 5-325 MG PO TABS: 1.0000 | ORAL_TABLET | ORAL | Status: DC | PRN

## 2020-01-22 MED ORDER — HYDRALAZINE HCL 20 MG/ML IJ SOLN: 5.0000 mg | INTRAMUSCULAR | Status: DC | PRN

## 2020-01-22 MED ORDER — GLYCOPYRROLATE PF 0.2 MG/ML IJ SOSY
PREFILLED_SYRINGE | INTRAMUSCULAR | Status: DC | PRN
  Administered 2020-01-22: .2 mg via INTRAVENOUS

## 2020-01-22 MED ORDER — HEPARIN SODIUM (PORCINE) 1000 UNIT/ML IJ SOLN
INTRAMUSCULAR | Status: AC
  Filled 2020-01-22: qty 1

## 2020-01-22 MED ORDER — HEPARIN SODIUM (PORCINE) 1000 UNIT/ML IJ SOLN
INTRAMUSCULAR | Status: DC | PRN
Start: 2020-01-22 — End: 2020-01-22
  Administered 2020-01-22: 6000 [IU] via INTRAVENOUS

## 2020-01-22 MED ORDER — ALUM & MAG HYDROXIDE-SIMETH 200-200-20 MG/5ML PO SUSP: 15.0000 mL | ORAL | Status: DC | PRN

## 2020-01-22 MED ORDER — MAGNESIUM SULFATE 2 GM/50ML IV SOLN: 2.0000 g | Freq: Every day | INTRAVENOUS | Status: DC | PRN

## 2020-01-22 MED ORDER — MIDAZOLAM HCL 2 MG/2ML IJ SOLN
INTRAMUSCULAR | Status: AC
  Filled 2020-01-22: qty 2

## 2020-01-22 MED ORDER — FENTANYL CITRATE (PF) 100 MCG/2ML IJ SOLN
INTRAMUSCULAR | Status: DC | PRN
  Administered 2020-01-22: 100 ug via INTRAVENOUS

## 2020-01-22 MED ORDER — HEPARIN SODIUM (PORCINE) 1000 UNIT/ML IJ SOLN
INTRAMUSCULAR | Status: AC
  Filled 2020-01-22: qty 2

## 2020-01-22 MED ORDER — MIDAZOLAM HCL 2 MG/2ML IJ SOLN
INTRAMUSCULAR | Status: DC | PRN
  Administered 2020-01-22: 1 mg via INTRAVENOUS

## 2020-01-22 MED ORDER — OXYCODONE HCL 5 MG PO TABS: 5.0000 mg | ORAL_TABLET | Freq: Once | ORAL | Status: DC | PRN

## 2020-01-22 MED ORDER — ONDANSETRON HCL 4 MG/2ML IJ SOLN: 4.0000 mg | Freq: Four times a day (QID) | INTRAMUSCULAR | Status: DC | PRN

## 2020-01-22 MED ORDER — LIDOCAINE 2% (20 MG/ML) 5 ML SYRINGE
INTRAMUSCULAR | Status: DC | PRN
  Administered 2020-01-22: 50 mg via INTRAVENOUS

## 2020-01-22 MED ORDER — FENTANYL CITRATE (PF) 250 MCG/5ML IJ SOLN
INTRAMUSCULAR | Status: AC
  Filled 2020-01-22: qty 5

## 2020-01-22 MED ORDER — CHLORHEXIDINE GLUCONATE 0.12 % MT SOLN
OROMUCOSAL | Status: AC
  Administered 2020-01-22: 15 mL via OROMUCOSAL
  Filled 2020-01-22: qty 15

## 2020-01-22 MED ORDER — LIDOCAINE 2% (20 MG/ML) 5 ML SYRINGE
INTRAMUSCULAR | Status: AC
  Filled 2020-01-22: qty 10

## 2020-01-22 MED ORDER — CEFAZOLIN SODIUM-DEXTROSE 2-4 GM/100ML-% IV SOLN
INTRAVENOUS | Status: AC
  Filled 2020-01-22: qty 100

## 2020-01-22 MED ORDER — EPHEDRINE 5 MG/ML INJ
INTRAVENOUS | Status: AC
  Filled 2020-01-22: qty 10

## 2020-01-22 MED ORDER — POLYETHYLENE GLYCOL 3350 17 G PO PACK: 17.0000 g | PACK | Freq: Every day | ORAL | Status: DC | PRN

## 2020-01-22 MED ORDER — CEFAZOLIN SODIUM-DEXTROSE 2-4 GM/100ML-% IV SOLN
2.0000 g | INTRAVENOUS | Status: AC
  Administered 2020-01-22: 2 g via INTRAVENOUS

## 2020-01-22 MED ORDER — ONDANSETRON HCL 4 MG/2ML IJ SOLN
INTRAMUSCULAR | Status: AC
  Filled 2020-01-22: qty 4

## 2020-01-22 MED ORDER — MORPHINE SULFATE (PF) 2 MG/ML IV SOLN: 2.0000 mg | INTRAVENOUS | Status: DC | PRN

## 2020-01-22 MED ORDER — SODIUM CHLORIDE 0.9 % IV SOLN: 500.0000 mL | Freq: Once | INTRAVENOUS | Status: DC | PRN

## 2020-01-22 MED ORDER — BISACODYL 10 MG RE SUPP: 10.0000 mg | Freq: Every day | RECTAL | Status: DC | PRN

## 2020-01-22 MED ORDER — SODIUM CHLORIDE 0.9 % IV SOLN
INTRAVENOUS | Status: DC | PRN
  Administered 2020-01-22: 500 mL

## 2020-01-22 MED ORDER — PROTAMINE SULFATE 10 MG/ML IV SOLN
INTRAVENOUS | Status: DC | PRN
  Administered 2020-01-22: 20 mg via INTRAVENOUS
  Administered 2020-01-22: 10 mg via INTRAVENOUS
  Administered 2020-01-22: 20 mg via INTRAVENOUS

## 2020-01-22 MED ORDER — PHENOL 1.4 % MT LIQD: 1.0000 | OROMUCOSAL | Status: DC | PRN

## 2020-01-22 MED ORDER — ACETAMINOPHEN 650 MG RE SUPP: 325.0000 mg | RECTAL | Status: DC | PRN

## 2020-01-22 MED ORDER — PROPOFOL 10 MG/ML IV BOLUS
INTRAVENOUS | Status: DC | PRN
  Administered 2020-01-22: 50 mg via INTRAVENOUS
  Administered 2020-01-22: 140 mg via INTRAVENOUS

## 2020-01-22 SURGICAL SUPPLY — 60 items
ADH SKN CLS APL DERMABOND .7 (GAUZE/BANDAGES/DRESSINGS) ×4
CANISTER SUCT 3000ML PPV (MISCELLANEOUS) ×3 IMPLANT
CATH BEACON 5.038 65CM KMP-01 (CATHETERS) ×3 IMPLANT
CATH OMNI FLUSH .035X70CM (CATHETERS) ×3 IMPLANT
CLIP LIGATING EXTRA MED SLVR (CLIP) IMPLANT
CLIP LIGATING EXTRA SM BLUE (MISCELLANEOUS) IMPLANT
COVER PROBE W GEL 5X96 (DRAPES) ×3 IMPLANT
COVER WAND RF STERILE (DRAPES) ×3 IMPLANT
DERMABOND ADVANCED (GAUZE/BANDAGES/DRESSINGS) ×2
DERMABOND ADVANCED .7 DNX12 (GAUZE/BANDAGES/DRESSINGS) ×3 IMPLANT
DEVICE CLOSURE PERCLS PRGLD 6F (VASCULAR PRODUCTS) IMPLANT
DRSG TEGADERM 2-3/8X2-3/4 SM (GAUZE/BANDAGES/DRESSINGS) ×6 IMPLANT
DRYSEAL FLEXSHEATH 12FR 33CM (SHEATH)
DRYSEAL FLEXSHEATH 14FR 33CM (SHEATH) ×1
DRYSEAL FLEXSHEATH 18FR 33CM (SHEATH) ×1
ELECT REM PT RETURN 9FT ADLT (ELECTROSURGICAL) ×6
ELECTRODE REM PT RTRN 9FT ADLT (ELECTROSURGICAL) ×4 IMPLANT
EXCLUDER TNK 28X14.5MMX12CM (Endovascular Graft) IMPLANT
EXCLUDER TRUNK 28X14.5MMX12CM (Endovascular Graft) ×3 IMPLANT
GLOVE BIOGEL PI IND STRL 7.0 (GLOVE) IMPLANT
GLOVE BIOGEL PI INDICATOR 7.0 (GLOVE) ×1
GLOVE SS BIOGEL STRL SZ 7.5 (GLOVE) ×2 IMPLANT
GLOVE SUPERSENSE BIOGEL SZ 7.5 (GLOVE) ×1
GLOVE SURG SS PI 6.5 STRL IVOR (GLOVE) ×2 IMPLANT
GOWN STRL REUS W/ TWL LRG LVL3 (GOWN DISPOSABLE) ×6 IMPLANT
GOWN STRL REUS W/TWL LRG LVL3 (GOWN DISPOSABLE) ×9
GRAFT BALLN CATH 65CM (STENTS) ×2 IMPLANT
GRAFT EXCLUDER AORTIC 28X3.3CM (Endovascular Graft) ×1 IMPLANT
KIT BASIN OR (CUSTOM PROCEDURE TRAY) ×3 IMPLANT
KIT TURNOVER KIT B (KITS) ×3 IMPLANT
LEG CONTRALATERAL 16X20X9.5 (Endovascular Graft) ×3 IMPLANT
LEG CONTRALATERAL 23X14 (Endovascular Graft) ×2 IMPLANT
NDL PERC 18GX7CM (NEEDLE) ×1 IMPLANT
NEEDLE PERC 18GX7CM (NEEDLE) ×3 IMPLANT
NS IRRIG 1000ML POUR BTL (IV SOLUTION) ×3 IMPLANT
PACK ENDOVASCULAR (PACKS) ×3 IMPLANT
PAD ARMBOARD 7.5X6 YLW CONV (MISCELLANEOUS) ×6 IMPLANT
PERCLOSE PROGLIDE 6F (VASCULAR PRODUCTS) ×12
SET MICROPUNCTURE 5F STIFF (MISCELLANEOUS) ×2 IMPLANT
SHEATH BRITE TIP 8FR 23CM (SHEATH) ×3 IMPLANT
SHEATH DRYSEAL FLEX 12FR 33CM (SHEATH) IMPLANT
SHEATH DRYSEAL FLEX 14FR 33CM (SHEATH) ×1 IMPLANT
SHEATH DRYSEAL FLEX 18FR 33CM (SHEATH) IMPLANT
SHEATH PINNACLE 8F 10CM (SHEATH) ×3 IMPLANT
STENT GRAFT BALLN CATH 65CM (STENTS) ×3
STENT GRAFT CONTRALAT 16X20X9. (Endovascular Graft) IMPLANT
STOPCOCK MORSE 400PSI 3WAY (MISCELLANEOUS) ×3 IMPLANT
SUT ETHILON 3 0 PS 1 (SUTURE) IMPLANT
SUT PROLENE 5 0 C 1 24 (SUTURE) IMPLANT
SUT VIC AB 2-0 CTX 36 (SUTURE) IMPLANT
SUT VIC AB 3-0 SH 18 (SUTURE) IMPLANT
SUT VIC AB 3-0 SH 27 (SUTURE)
SUT VIC AB 3-0 SH 27X BRD (SUTURE) IMPLANT
SUT VICRYL 4-0 PS2 18IN ABS (SUTURE) ×6 IMPLANT
SYR 20ML LL LF (SYRINGE) ×3 IMPLANT
TOWEL GREEN STERILE (TOWEL DISPOSABLE) ×3 IMPLANT
TRAY FOLEY MTR SLVR 16FR STAT (SET/KITS/TRAYS/PACK) ×3 IMPLANT
TUBING HIGH PRESSURE 120CM (CONNECTOR) ×3 IMPLANT
WIRE AMPLATZ SS-J .035X180CM (WIRE) ×6 IMPLANT
WIRE BENTSON .035X145CM (WIRE) ×6 IMPLANT

## 2020-01-22 NOTE — H&P (Signed)
[name], MD  Physician  Specialty:  Vascular Surgery  Progress Notes     Signed  Encounter Date:  08/13/2019          Signed       Show:Clear all [x] Manual[x] Template[] Copied  Added by: [x] Early, Arvilla Meres, MD  [] Hover for details                                       [facility]  Patient name: Aaron Hawkins           MRN: [id number]        DOB: [birth year]          Sex: male  REASON FOR VISIT: Continued follow-up of known abdominal aortic aneurysm  HPI: Aaron Hawkins is a 79 y.o. male here today for follow-up.  Looks quite good today.  Appears younger than his stated age of 53.  Remains very active and is making the best of restrictions regarding Covid.  He has no new medical difficulties and no symptoms associated with his aneurysm      Past Medical History:  Diagnosis Date  . Arthritis    "probably in my knees and right shoulder" (11/22/2017)  . Duodenal ulcer 1964  . Enlarged prostate without urinary obstruction   . History of Guillain-Barre syndrome 2003  . History of hydronephrosis    W/ STRICTURE - not aware of this  . History of kidney stones    "passed it"  . History of melanoma excision    BACK-- 04-04-2007 W/ SLN BX RIGHT AXILL, right arm 2017  . History of nephrolithiasis    right kidney  . Microscopic hematuria    history of  . PONV (postoperative nausea and vomiting)    w/hip replacement  . Prostate cancer ([place]) 05/30/13   gleason 3+3=6, volume 58 cc  . Skin cancer 04/04/2007; 2017   "back, right arm"         Family History  Problem Relation Age of Onset  . Cancer Father        prostate  . Cancer Brother        prostate    SOCIAL HISTORY: Social History        Tobacco Use  . Smoking status: Former Smoker    Packs/day: 1.00    Years: 7.00    Pack years: 7.00    Types: Cigarettes    Quit date: 06/18/1966    Years since quitting: 53.1  . Smokeless  tobacco: Never Used  Substance Use Topics  . Alcohol use: Yes    Alcohol/week: 2.0 standard drinks    Types: 2 Glasses of wine per week    No Known Allergies        Current Outpatient Medications  Medication Sig Dispense Refill  . acetaminophen (TYLENOL) 500 MG tablet Take 500 mg by mouth every 6 (six) hours as needed.    . diclofenac sodium (VOLTAREN) 1 % GEL Apply 1 application topically daily as needed (joint pain).    Marland Kitchen lidocaine (LIDODERM) 5 % Place 1 patch onto the skin daily. Remove & Discard patch within 12 hours or as directed by MD    . Multiple Vitamins-Minerals (PRESERVISION AREDS 2) CAPS Take 1 capsule by mouth 2 (two) times daily.    . Polyvinyl Alcohol-Povidone (REFRESH OP) Place 2 drops into both eyes daily as needed (dry  eyes).    . tamsulosin (FLOMAX) 0.4 MG CAPS capsule Take 2 capsules (0.8 mg total) by mouth daily. (Patient not taking: Reported on 11/07/2017) 60 capsule 11   No current facility-administered medications for this visit.    REVIEW OF SYSTEMS:  [X]  denotes positive finding, [ ]  denotes negative finding Cardiac  Comments:  Chest pain or chest pressure:    Shortness of breath upon exertion:    Short of breath when lying flat:    Irregular heart rhythm:        Vascular    Pain in calf, thigh, or hip brought on by ambulation:    Pain in feet at night that wakes you up from your sleep:     Blood clot in your veins:    Leg swelling:           PHYSICAL EXAM:    Vitals:   08/13/19 1001  BP: (!) 171/89  Pulse: 70  Resp: 20  Temp: 98.2 F (36.8 C)  SpO2: 100%  Weight: 183 lb 14.4 oz (83.4 kg)  Height: 5\' 8"  (1.727 m)    GENERAL: The patient is a well-nourished male, in no acute distress. The vital signs are documented above. CARDIOVASCULAR: 2+ radial pulses bilaterally.  Easily palpable abdominal aortic aneurysm which is nontender.  2+ femoral pulses and 2+ dorsalis pedis pulses. PULMONARY:  There is good air exchange  MUSCULOSKELETAL: There are no major deformities or cyanosis. NEUROLOGIC: No focal weakness or paresthesias are detected. SKIN: There are no ulcers or rashes noted. PSYCHIATRIC: The patient has a normal affect.  DATA:  CT scan today reveals maximal diameter 5.4 cm up from 5.1 cm 6 months ago.  He does appear to have adequate infrarenal neck for stent grafting.  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  He has had slightly more rapid growth than anticipated over the last 6 months.  He certainly is at the threshold for elective repair.  He will discuss this with his wife but is leaning towards elective repair once elective surgery is resumed.  We will coordinate this with him.    [name], MD FACS [facility] [phone] Pager [phone]         Addendum:  The patient has been re-examined and re-evaluated.  The patient's history and physical has been reviewed and is unchanged.    Aaron Hawkins is a 79 y.o. male is being admitted with ABDOMINAL AORTIC ANEURYSM. All the risks, benefits and other treatment options have been discussed with the patient. The patient has consented to proceed with Procedure(s): ABDOMINAL AORTIC ENDOVASCULAR STENT GRAFT as a surgical intervention.  Todd Early 01/22/2020 10:28 AM Vascular and Vein Surgery

## 2020-01-22 NOTE — Anesthesia Procedure Notes (Signed)
Arterial Line Insertion Start/End7/01/2020 10:30 AM, 01/22/2020 10:48 AM Performed by: Candis Shine, CRNA, CRNA  Patient location: Pre-op. Preanesthetic checklist: patient identified, IV checked, site marked, risks and benefits discussed, surgical consent, monitors and equipment checked, pre-op evaluation and timeout performed Left, radial was placed Catheter size: 20 G Hand hygiene performed  and maximum sterile barriers used   Attempts: 3 Procedure performed without using ultrasound guided technique. Following insertion, dressing applied and Biopatch. Post procedure assessment: normal and unchanged  Patient tolerated the procedure well with no immediate complications.

## 2020-01-22 NOTE — Anesthesia Postprocedure Evaluation (Signed)
Anesthesia Post Note  Patient: BILLEY WOJCIAK  Procedure(s) Performed: ABDOMINAL AORTIC ENDOVASCULAR STENT GRAFT repair (N/A Abdomen) ULTRASOUND GUIDANCE FOR VASCULAR ACCESS, bilateral femoral arteries (Bilateral Groin)     Patient location during evaluation: PACU Anesthesia Type: General Level of consciousness: awake and alert Pain management: pain level controlled Vital Signs Assessment: post-procedure vital signs reviewed and stable Respiratory status: spontaneous breathing, nonlabored ventilation, respiratory function stable and patient connected to nasal cannula oxygen Cardiovascular status: blood pressure returned to baseline and stable Postop Assessment: no apparent nausea or vomiting Anesthetic complications: no   No complications documented.  Last Vitals:  Vitals:   01/22/20 1535 01/22/20 1555  BP: (!) 129/59 (!) 151/70  Pulse: (!) 52 62  Resp: 15 16  Temp:  36.6 C  SpO2: 96% 98%    Last Pain:  Vitals:   01/22/20 1555  TempSrc: Oral  PainSc: 0-No pain                 Faraz Ponciano S

## 2020-01-22 NOTE — Op Note (Signed)
OPERATIVE REPORT  DATE OF SURGERY: 01/22/2020  PATIENT: Aaron Hawkins, 79 y.o. male MRN: [id number]  DOB: 30-Aug-1940  PRE-OPERATIVE DIAGNOSIS: Abdominal aortic aneurysm  POST-OPERATIVE DIAGNOSIS:  Same  PROCEDURE: Gore stent graft repair abdominal aortic aneurysm  SURGEON:  Curt Jews, M.D.  PHYSICIAN ASSISTANT: Liana Crocker, PA-C  The assistant was needed for exposure and to expedite the case  ANESTHESIA: General  EBL: per anesthesia record  Total I/O In: 1761.9 [I.V.:1661.9; IV Piggyback:100] Out: 500 [Urine:400; Blood:100]  BLOOD ADMINISTERED: none  DRAINS: none  SPECIMEN: none  COUNTS CORRECT:  YES  PATIENT DISPOSITION:  PACU - hemodynamically stable  PROCEDURE DETAILS: The patient was taken to the operating placed supine position where the area of the abdomen and both groins were prepped and draped in usual sterile fashion.  Using SonoSite ultrasound and an 18-gauge singlewall puncture the right and left groins were accessed and a Bentson wire was passed centrally.  2 separate Perclose devices were placed at 10:00 and 2:00 and over each guidewire and partially deployed.  8 French sheath were passed over the guidewire.  Kumpe catheters were used to position the guidewire into the suprarenal aorta and the Bentson wires were exchanged for Amplatz superstiff wires bilaterally.  The patient was given 6000 units of intravenous heparin.  After adequate circulation time a 18 French sheath was placed through the left groin and a 14 [place] to the right.  A pigtail catheter was placed through the 14 French sheath on the right.  The main body was placed in a reverse leg position.  The remaining body was placed in the left groin and was a 28 x 14 x 12 cm graft.  A pigtail catheter was used for a aortic injection this revealed the level of the renal arteries.  The left renal was slightly lower than the right.  The main portion of the main body was deployed just below the level of  the left renal artery and a repeat injection revealed good placement.  Next the contralateral limb was cannulated using a KMP catheter for direction of the Bentson wire into the contralateral gate of the through the right groin.  A pigtail catheter was positioned into the aortic body and was twisted to confirm that this was in fact inside the graft.  A retrograde injection through the right sheath was used to determine the level of the right internal iliac artery takeoff.  A 14 cm x 23 bell bottom extension was positioned to land just above the hypogastric artery takeoff.  The ipsilateral limb was then deployed on the vein graft.  The vein graft was extended with a 20 x 9.5 cm.  Again retrograde injection through the left femoral sheath revealed the level of the left hypogastric and the stent was landed just above this takeoff.  An MO B balloon was then used to gently dilate the proximal and distal attachment sites and also all junctions.  Pigtail catheter was passed in the suprarenal aorta.  Repeat injection showed excellent positioning of the graft.  There did appear to be a type I endoleak at the lateral right edge of the proximal stent graft.  For this reason reason the balloon was replaced and this was again gently ballooned at the proximal anastomosis.  Repeat injection showed suggestion of persistent blush at this area.  For this reason a 28 x 3.3 cm cuff was positioned inside the main body of the device overlapping at the level of the proximal seal zone.  This was deployed and gently dilated with a MO B balloon.  Repeat pigtail injection after this showed no evidence of endoleak and excellent positioning.  The Amplatz wires were exchanged for a Bentson wires bilaterally.  The right and left femoral sheaths were removed and the prior placed Perclose devices were secured giving excellent hemostasis.  The puncture sites were closed with 4-0 subcuticular Vicryl stitch.  The patient had a sterile dressing placed.   The patient had normal 2+ dorsalis pedis pulses bilaterally and was transferred to the recovery room in stable condition   Aaron Hawkins, M.D., [facility] 01/22/2020 4:14 PM

## 2020-01-22 NOTE — Transfer of Care (Signed)
Immediate Anesthesia Transfer of Care Note  Patient: Aaron Hawkins  Procedure(s) Performed: ABDOMINAL AORTIC ENDOVASCULAR STENT GRAFT repair (N/A Abdomen) ULTRASOUND GUIDANCE FOR VASCULAR ACCESS, bilateral femoral arteries (Bilateral Groin)  Patient Location: PACU  Anesthesia Type:General  Level of Consciousness: awake and alert   Airway & Oxygen Therapy: Patient Spontanous Breathing and Patient connected to nasal cannula oxygen  Post-op Assessment: Report given to RN, Post -op Vital signs reviewed and stable and Patient moving all extremities X 4  Post vital signs: Reviewed and stable  Last Vitals:  Vitals Value Taken Time  BP 112/69 01/22/20 1334  Temp    Pulse 86 01/22/20 1336  Resp 19 01/22/20 1336  SpO2 99 % 01/22/20 1336  Vitals shown include unvalidated device data.  Last Pain:  Vitals:   01/22/20 0927  TempSrc:   PainSc: 0-No pain         Complications: No complications documented.

## 2020-01-22 NOTE — Progress Notes (Signed)
  Day of Surgery Note    Subjective:  Comfortable in recovery   Vitals:   01/22/20 0858 01/22/20 1335  BP: (!) 186/82 112/69  Pulse: 60 81  Resp: 17 (!) 25  Temp: 98.5 F (36.9 C) 97.7 F (36.5 C)  SpO2: 99% 99%    Incisions:   Bilateral groins are soft without hematoma Extremities:  2+ palpable DP pulses bilaterally Cardiac:  regular Lungs:  Non labored Abdomen:  Soft, NT/ND   Assessment/Plan:  This is a 79 y.o. male who is s/p  EVAR  -pt doing well in recovery -pt not on asa/statin at home.  These have been ordered.  Pt says he is not on asa at home bc he does not like them.  Let him know these have been ordered. -to [place] later this afternoon. -anticipate discharge tomorrow if uneventful evening.    Leontine Locket, PA-C 01/22/2020 1:49 PM [phone]

## 2020-01-22 NOTE — Anesthesia Procedure Notes (Signed)
Procedure Name: Intubation Date/Time: 01/22/2020 11:24 AM Performed by: Neldon Newport, CRNA Pre-anesthesia Checklist: Timeout performed, Patient being monitored, Suction available, Emergency Drugs available and Patient identified Patient Re-evaluated:Patient Re-evaluated prior to induction Oxygen Delivery Method: Circle system utilized Preoxygenation: Pre-oxygenation with 100% oxygen Induction Type: IV induction Ventilation: Mask ventilation without difficulty Laryngoscope Size: Mac and 4 Grade View: Grade I Tube type: Oral Tube size: 7.5 mm Number of attempts: 1 Placement Confirmation: ETT inserted through vocal cords under direct vision,  positive ETCO2 and breath sounds checked- equal and bilateral Secured at: 23 cm Tube secured with: Tape Dental Injury: Teeth and Oropharynx as per pre-operative assessment

## 2020-01-22 NOTE — Discharge Instructions (Signed)
  [facility] [place]   Discharge Instructions  Endovascular Aortic Aneurysm Repair  Please refer to the following instructions for your post-procedure care. Your surgeon or Physician Assistant will discuss any changes with you.  Activity  You are encouraged to walk as much as you can. You can slowly return to normal activities but must avoid strenuous activity and heavy lifting until your doctor tells you it's OK. Avoid activities such as vacuuming or swinging a gold club. It is normal to feel tired for several weeks after your surgery. Do not drive until your doctor gives the OK and you are no longer taking prescription pain medications. It is also normal to have difficulty with sleep habits, eating, and bowel movements after surgery. These will go away with time.  Bathing/Showering  Shower daily after you go home.  Do not soak in a bathtub, hot tub, or swim until the incision heals completely.  If you have incisions in your groin, wash the groin wounds with soap and water daily and pat dry. (No tub bath-only shower)  Then put a dry gauze or washcloth there to keep this area dry to help prevent wound infection daily and as needed.  Do not use Vaseline or neosporin on your incisions.  Only use soap and water on your incisions and then protect and keep dry.  Incision Care  Shower every day. Clean your incision with mild soap and water. Pat the area dry with a clean towel. You do not need a bandage unless otherwise instructed. Do not apply any ointments or creams to your incision. If you clothing is irritating, you may cover your incision with a dry gauze pad.  Diet  Resume your normal diet. There are no special food restrictions following this procedure. A low fat/low cholesterol diet is recommended for all patients with vascular disease. In order to heal from your surgery, it is CRITICAL to get adequate nutrition. Your body requires vitamins, minerals, and protein.  Vegetables are the best source of vitamins and minerals. Vegetables also provide the perfect balance of protein. Processed food has little nutritional value, so try to avoid this.  Medications  Resume taking all of your medications unless your doctor or nurse practitioner tells you not to. If your incision is causing pain, you may take over-the-counter pain relievers such as acetaminophen (Tylenol). If you were prescribed a stronger pain medication, please be aware these medications can cause nausea and constipation. Prevent nausea by taking the medication with a snack or meal. Avoid constipation by drinking plenty of fluids and eating foods with a high amount of fiber, such as fruits, vegetables, and grains.  Do not take Tylenol if you are taking prescription pain medications.   Follow up  Our office will schedule a follow-up appointment with a CT scan 3-4 weeks after your surgery.  Please call us immediately for any of the following conditions  Severe or worsening pain in your legs or feet or in your abdomen back or chest. Increased pain, redness, drainage (pus) from your incision site. Increased abdominal pain, bloating, nausea, vomiting or persistent diarrhea. Fever of 101 degrees or higher. Swelling in your leg (s),  Reduce your risk of vascular disease  Stop smoking. If you would like help call [facility] at 1-800-QUIT-NOW (1-800-784-8669) or [place] at 336-586-4000. Manage your cholesterol Maintain a desired weight Control your diabetes Keep your blood pressure down  If you have questions, please call the office at [phone].  

## 2020-01-22 NOTE — Progress Notes (Signed)
Patient to room 4E04 from PACU. Vital signs obtained. CHG bath completed. Alert and oriented to room and call light. Call bell within reach.  Paulene Floor, RN

## 2020-01-23 ENCOUNTER — Encounter (HOSPITAL_COMMUNITY): Payer: Self-pay | Admitting: Vascular Surgery

## 2020-01-23 LAB — BASIC METABOLIC PANEL
Anion gap: 10 (ref 5–15)
BUN: 8 mg/dL (ref 8–23)
CO2: 26 mmol/L (ref 22–32)
Calcium: 8.4 mg/dL — ABNORMAL LOW (ref 8.9–10.3)
Chloride: 104 mmol/L (ref 98–111)
Creatinine, Ser: 0.99 mg/dL (ref 0.61–1.24)
GFR calc Af Amer: >60 mL/min (ref >60)
GFR calc non Af Amer: >60 mL/min (ref >60)
Glucose, Bld: 107 mg/dL — ABNORMAL HIGH (ref 70–99)
Potassium: 3.6 mmol/L (ref 3.5–5.1)
Sodium: 140 mmol/L (ref 135–145)

## 2020-01-23 LAB — CBC
HCT: 39.5 % (ref 39.0–52.0)
Hemoglobin: 11.6 g/dL — ABNORMAL LOW (ref 13.0–17.0)
MCH: 22.5 pg — ABNORMAL LOW (ref 26.0–34.0)
MCHC: 29.4 g/dL — ABNORMAL LOW (ref 30.0–36.0)
MCV: 76.6 fL — ABNORMAL LOW (ref 80.0–100.0)
Platelets: 134 10*3/uL — ABNORMAL LOW (ref 150–400)
RBC: 5.16 MIL/uL (ref 4.22–5.81)
WBC: 6.7 10*3/uL (ref 4.0–10.5)
nRBC: 0 % (ref 0.0–0.2)

## 2020-01-23 MED ORDER — OXYCODONE-ACETAMINOPHEN 5-325 MG PO TABS: 1.0000 | ORAL_TABLET | Freq: Four times a day (QID) | ORAL | 0 refills | Status: DC | PRN

## 2020-01-23 MED ORDER — PROSIGHT PO TABS
1.0000 | ORAL_TABLET | Freq: Every day | ORAL | Status: DC
  Administered 2020-01-23: 1 via ORAL
  Filled 2020-01-23: qty 1

## 2020-01-23 MED ORDER — ASPIRIN 81 MG PO TBEC: 81.0000 mg | DELAYED_RELEASE_TABLET | Freq: Every day | ORAL | Status: DC

## 2020-01-23 MED ORDER — ROSUVASTATIN CALCIUM 10 MG PO TABS: 10.0000 mg | ORAL_TABLET | Freq: Every day | ORAL | 6 refills | Status: AC

## 2020-01-23 MED ORDER — CHLORHEXIDINE GLUCONATE CLOTH 2 % EX PADS: 6.0000 | MEDICATED_PAD | Freq: Every day | CUTANEOUS | Status: DC

## 2020-01-23 NOTE — Discharge Summary (Signed)
EVAR Discharge Summary   Aaron Hawkins 1941-06-03 79 y.o. male  MRN: [id number]  Admission Date: 01/22/2020  Discharge Date: 01/23/2020  Physician: No att. providers found  Admission Diagnosis: AAA (abdominal aortic aneurysm) ([place]) [I71.4]   HPI:   This is a 79 y.o. male here today for follow-up. Looks quite good today. Appears younger than his stated age of 53. Remains very active and is making the best of restrictions regarding Covid. He has no new medical difficulties and no symptoms associated with his aneurysm  Hospital Course:  The patient was admitted to the hospital and taken to the operating room on 01/22/2020 and underwent: Gore stent graft repair abdominal aortic aneurysm    The pt tolerated the procedure well and was transported to the PACU in good condition.   By POD 1, he was doing well with palpable pedal pulses.  He was able to void without difficulty.    The remainder of the hospital course consisted of increasing mobilization and increasing intake of solids without difficulty.  CBC    Component Value Date/Time   WBC 6.7 01/23/2020 0301   RBC 5.16 01/23/2020 0301   HGB 11.6 (L) 01/23/2020 0301   HGB 11.3 (L) 01/09/2020 1033   HGB 16.9 03/30/2011 1349   HCT 39.5 01/23/2020 0301   HCT 48.6 03/30/2011 1349   PLT 134 (L) 01/23/2020 0301   PLT 234 01/09/2020 1033   PLT 167 03/30/2011 1349   MCV 76.6 (L) 01/23/2020 0301   MCV 91.2 03/30/2011 1349   MCH 22.5 (L) 01/23/2020 0301   MCHC 29.4 (L) 01/23/2020 0301   RDW Not Measured 01/23/2020 0301   RDW 13.8 03/30/2011 1349   LYMPHSABS 2.2 01/09/2020 1033   LYMPHSABS 1.9 03/30/2011 1349   MONOABS 0.6 01/09/2020 1033   MONOABS 0.5 03/30/2011 1349   EOSABS 0.1 01/09/2020 1033   EOSABS 0.1 03/30/2011 1349   BASOSABS 0.0 01/09/2020 1033   BASOSABS 0.0 03/30/2011 1349    BMET    Component Value Date/Time   NA 140 01/23/2020 0301   K 3.6 01/23/2020 0301   CL 104 01/23/2020 0301   CO2 26 01/23/2020  0301   GLUCOSE 107 (H) 01/23/2020 0301   BUN 8 01/23/2020 0301   CREATININE 0.99 01/23/2020 0301   CREATININE 1.15 01/09/2020 1033   CALCIUM 8.4 (L) 01/23/2020 0301   GFRNONAA >60 01/23/2020 0301   GFRNONAA >60 01/09/2020 1033   GFRAA >60 01/23/2020 0301   GFRAA >60 01/09/2020 1033       Discharge Instructions    Discharge patient   Complete by: As directed    Discharge home after pt has voided and walked. Thanks   Discharge disposition: 01-Home or Self Care   Discharge patient date: 01/23/2020      Discharge Diagnosis:  AAA (abdominal aortic aneurysm) [facility]) [I71.4]  Secondary Diagnosis: Patient Active Problem List   Diagnosis Date Noted  . AAA (abdominal aortic aneurysm) ([place]) 01/22/2020  . History of melanoma   . Family history of prostate cancer   . Family history of breast cancer   . Iron deficiency anemia 12/20/2019  . AAA (abdominal aortic aneurysm) without rupture ([place]) 12/06/2019  . History of nephrolithiasis   . History of hiatal hernia   . Arthritis   . 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 Prostate cancer ([place])   . History of Guillain-Barre syndrome 2003  . Duodenal ulcer 1964  Past Medical History:  Diagnosis Date  . AAA (abdominal aortic aneurysm) ([place])   . Anemia   . Arthritis    "probably in my knees and right shoulder" (11/22/2017)  . Duodenal ulcer 1964  . Family history of breast cancer   . Family history of prostate cancer   . History of Guillain-Barre syndrome 2003  . History of hiatal hernia    per patient about 20 years ago  . History of hydronephrosis    W/ STRICTURE - not aware of this  . History of melanoma   . History of melanoma excision    BACK-- 04-04-2007 W/ SLN BX RIGHT AXILL, right arm 2017  . History of nephrolithiasis    right kidney  . Microscopic hematuria    history of  . Prostate cancer ([place]) 05/30/13   gleason 3+3=6,  volume 58 cc  . Skin cancer 04/04/2007; 2017   "back, right arm"     Allergies as of 01/23/2020   No Known Allergies     Medication List    TAKE these medications   acetaminophen 500 MG tablet Commonly known as: TYLENOL Take 500 mg by mouth every 6 (six) hours as needed for moderate pain.   aspirin 81 MG EC tablet Take 1 tablet (81 mg total) by mouth daily at 6 (six) AM. Swallow whole. Start taking on: January 24, 2020   oxyCODONE-acetaminophen 5-325 MG tablet Commonly known as: Percocet Take 1 tablet by mouth every 6 (six) hours as needed.   PreserVision AREDS 2 Caps Take 1 capsule by mouth daily.   REFRESH OP Place 2 drops into both eyes daily as needed (dry/irritated eyes).   rosuvastatin 10 MG tablet Commonly known as: CRESTOR Take 1 tablet (10 mg total) by mouth daily.       Discharge Instructions:  [facility]  Discharge Instructions Endovascular Aortic Aneurysm Repair  Please refer to the following instructions for your post-procedure care. Your surgeon or Physician Assistant will discuss any changes with you.  Activity  You are encouraged to walk as much as you can. You can slowly return to normal activities but must avoid strenuous activity and heavy lifting until your doctor tells you it's OK. Avoid activities such as vacuuming or swinging a gold club. It is normal to feel tired for several weeks after your surgery. Do not drive until your doctor gives the OK and you are no longer taking prescription pain medications. It is also normal to have difficulty with sleep habits, eating, and bowel movements after surgery. These will go away with time.  Bathing/Showering  You may shower after you go home. If you have an incision, do not soak in a bathtub, hot tub, or swim until the incision heals completely.  Incision Care  Shower every day. Clean your incision with mild soap and water. Pat the area dry with a clean towel. You do not  need a bandage unless otherwise instructed. Do not apply any ointments or creams to your incision. If you clothing is irritating, you may cover your incision with a dry gauze pad.  Diet  Resume your normal diet. There are no special food restrictions following this procedure. A low fat/low cholesterol diet is recommended for all patients with vascular disease. In order to heal from your surgery, it is CRITICAL to get adequate nutrition. Your body requires vitamins, minerals, and protein. Vegetables are the best source of vitamins and minerals. Vegetables also provide the perfect balance of protein. Processed  food has little nutritional value, so try to avoid this.  Medications  Resume taking all of your medications unless your doctor or Physician Assistnat tells you not to. If your incision is causing pain, you may take over-the-counter pain relievers such as acetaminophen (Tylenol). If you were prescribed a stronger pain medication, please be aware these medications can cause nausea and constipation. Prevent nausea by taking the medication with a snack or meal. Avoid constipation by drinking plenty of fluids and eating foods with a high amount of fiber, such as fruits, vegetables, and grains.  Do not take Tylenol if you are taking prescription pain medications.   Follow up  [place] office will schedule a follow-up appointment with a C.T. scan 3-4 weeks after your surgery.  Please call us immediately for any of the following conditions  . Severe or worsening pain in your legs or feet or in your abdomen back or chest. . Increased pain, redness, drainage (pus) from your incision sit. . Increased abdominal pain, bloating, nausea, vomiting or persistent diarrhea. . Fever of 101 degrees or higher. . Swelling in your leg (s), .  Reduce your risk of vascular disease  .Stop smoking. If you would like help call [facility] at 1-800-QUIT-NOW 206-780-8268) or [place] at 807-570-6718. .Manage your  cholesterol .Maintain a desired weight .Control your diabetes .Keep your blood pressure down  If you have questions, please call the office at [phone].   Prescriptions given: 1.  Roxicet #8 No Refill 2.  Crestor 10mg  daily #30 six refills 3.  Aspirin 81mg  daily OTC  Disposition: home  Patient's condition: is Good  Follow up: 1. Dr. [name] in 4 weeks with CTA protocol 2. Dr. Harrington Challenger in 6 weeks with labs for new medication.    Leontine Locket, PA-C [facility] [phone] 01/23/2020  2:25 PM   - For VQI Registry use - Post-op:  Time to Extubation: [x]  In OR, [ ]  < 12 hrs, [ ]  12-24 hrs, [ ]  >=24 hrs Vasopressors Req. Post-op: No MI: No., [ ]  Troponin only, [ ]  EKG or Clinical New Arrhythmia: No CHF: No ICU Stay: 1 day in stepdown Transfusion: No     If yes, n/a units given  Complications: Resp failure: No., [ ]  Pneumonia, [ ]  Ventilator Chg in renal function: No., [ ]  Inc. Cr > 0.5, [ ]  Temp. Dialysis,  [ ]  Permanent dialysis Leg ischemia: No., no Surgery needed, [ ]  Yes, Surgery needed,  [ ]  Amputation Bowel ischemia: No., [ ]  Medical Rx, [ ]  Surgical Rx Wound complication: No., [ ]  Superficial separation/infection, [ ]  Return to OR Return to OR: No  Return to OR for bleeding: No Stroke: No., [ ]  Minor, [ ]  Major  Discharge medications: Statin use:  Yes  ASA use:  Yes  Plavix use:  No  Beta blocker use:  No  ARB use:  No ACEI use:  No CCB use:  No

## 2020-01-23 NOTE — Progress Notes (Signed)
D/ced patient's Right Aline.  Pressure held to right wrist for 10 min.  No bleeding noted from wrist when pressure was removed.  Pressure dressing applied to wrist and patient instructed to notify RN if bleeding started from wrist.  Patient stated understanding.  Patient tolerated removal well and without any distress.

## 2020-01-23 NOTE — Progress Notes (Signed)
  Progress Note    01/23/2020 7:30 AM 1 Day Post-Op  Subjective:  No complaints  Afebrile HR 50's-70's NSR [id number]'N systolic 36% RA  Vitals:   01/22/20 2300 01/23/20 0300  BP: (!) 162/72 120/85  Pulse: 62 73  Resp: 18 16  Temp: 98.2 F (36.8 C) 98 F (36.7 C)  SpO2: 97% 95%    Physical Exam: Cardiac:  regular Lungs:  Non labored Incisions:  Bilateral groins are soft without hematoma Extremities:  Palpable DP pulses bilaterally Abdomen:  Soft, NT/ND  CBC    Component Value Date/Time   WBC 6.7 01/23/2020 0301   RBC 5.16 01/23/2020 0301   HGB 11.6 (L) 01/23/2020 0301   HGB 11.3 (L) 01/09/2020 1033   HGB 16.9 03/30/2011 1349   HCT 39.5 01/23/2020 0301   HCT 48.6 03/30/2011 1349   PLT 134 (L) 01/23/2020 0301   PLT 234 01/09/2020 1033   PLT 167 03/30/2011 1349   MCV 76.6 (L) 01/23/2020 0301   MCV 91.2 03/30/2011 1349   MCH 22.5 (L) 01/23/2020 0301   MCHC 29.4 (L) 01/23/2020 0301   RDW Not Measured 01/23/2020 0301   RDW 13.8 03/30/2011 1349   LYMPHSABS 2.2 01/09/2020 1033   LYMPHSABS 1.9 03/30/2011 1349   MONOABS 0.6 01/09/2020 1033   MONOABS 0.5 03/30/2011 1349   EOSABS 0.1 01/09/2020 1033   EOSABS 0.1 03/30/2011 1349   BASOSABS 0.0 01/09/2020 1033   BASOSABS 0.0 03/30/2011 1349    BMET    Component Value Date/Time   NA 140 01/23/2020 0301   K 3.6 01/23/2020 0301   CL 104 01/23/2020 0301   CO2 26 01/23/2020 0301   GLUCOSE 107 (H) 01/23/2020 0301   BUN 8 01/23/2020 0301   CREATININE 0.99 01/23/2020 0301   CREATININE 1.15 01/09/2020 1033   CALCIUM 8.4 (L) 01/23/2020 0301   GFRNONAA >60 01/23/2020 0301   GFRNONAA >60 01/09/2020 1033   GFRAA >60 01/23/2020 0301   GFRAA >60 01/09/2020 1033    INR    Component Value Date/Time   INR 1.2 01/22/2020 1340     Intake/Output Summary (Last 24 hours) at 01/23/2020 0730 Last data filed at 01/23/2020 0600 Gross per 24 hour  Intake 2721.88 ml  Output 2350 ml  Net 371.88 ml     Assessment:  79 y.o.  male is s/p:  EVAR  1 Day Post-Op  Plan: -pt doing well this am.  He does have palpable DP pulses bilaterally -foley removed this am and he needs to void and walk prior to discharge.  -dc home later this morning and f/u with Dr. [name] in 4 weeks with CTA.   Leontine Locket, PA-C [facility] [phone] 01/23/2020 7:30 AM

## 2020-01-23 NOTE — Progress Notes (Signed)
Pt ambulated 450 feet in hallway with front wheel walker and stand by assist. Pt tolerated well. Call bell in reach. Will continue to monitor.  Arletta Bale, RN

## 2020-01-23 NOTE — Progress Notes (Signed)
Patient's foley removed.  Drained 200cc of urine from bladder prior to removal.  10cc removed from bulb prior to removal.  catherter intact upon removal.  Patient tolerated removal well and without any complaints.  Urinal placed at patient's bedside.  No distress noted from patient

## 2020-01-23 NOTE — Progress Notes (Signed)
Discharge instructions given to patient. IV removed, clean and intact. Medications and wound care reviewed. All questions answered. Pt escorted home with wife.  Orine Goga R Antinio Sanderfer, RN  

## 2020-01-24 ENCOUNTER — Encounter: Payer: Self-pay | Admitting: Genetic Counselor

## 2020-01-24 ENCOUNTER — Telehealth: Payer: Self-pay | Admitting: Genetic Counselor

## 2020-01-24 ENCOUNTER — Ambulatory Visit: Payer: Self-pay | Admitting: Genetic Counselor

## 2020-01-24 DIAGNOSIS — Z1379 Encounter for other screening for genetic and chromosomal anomalies: Secondary | ICD-10-CM | POA: Insufficient documentation

## 2020-01-24 NOTE — Telephone Encounter (Signed)
Revealed negative genetic testing. Discussed that we do not know why he has had multiple cancers, or why there is cancer in the family. There could be a genetic mutation in the family that Aaron Hawkins did not inherit. There could also be a mutation in a different gene that we are not testing, or our current technology may not be able detect certain mutations. It will therefore be important for him to stay in contact with genetics to keep up with whether additional testing may be appropriate in the future.   A variant of uncertain significance was detected in the POLE gene called c.2090C>T. His result is still considered normal at this time and should not impact his medical management.

## 2020-01-24 NOTE — Progress Notes (Signed)
HPI:  Mr. Ybarra was previously seen in the [facility] due to a personal and family history of cancer and concerns regarding a hereditary predisposition to cancer. Please refer to our prior cancer genetics clinic note for more information regarding our discussion, assessment and recommendations, at the time. Mr. Schetter recent genetic test results were disclosed to him, as were recommendations warranted by these results. These results and recommendations are discussed in more detail below.  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 The report date is 01/17/2020.  The Common Hereditary Cancers Panel offered by [facility] includes sequencing and/or deletion duplication testing of the following 48 genes: APC, ATM, AXIN2, BARD1, BMPR1A, BRCA1, BRCA2, BRIP1, CDH1, CDK4, CDKN2A (p14ARF), CDKN2A (p16INK4a), CHEK2, CTNNA1, DICER1, EPCAM (Deletion/duplication testing only), GREM1 (promoter region deletion/duplication testing only), KIT, MEN1, MLH1, MSH2, MSH3, MSH6, MUTYH, NBN, NF1, NTHL1, PALB2, PDGFRA, PMS2, POLD1, POLE, PTEN, RAD50, RAD51C, RAD51D, RNF43, SDHB, SDHC, SDHD, SMAD4, SMARCA4. STK11, TP53, TSC1, TSC2, and VHL.  The following genes were evaluated for sequence changes only: SDHA and HOXB13 c.251G>A variant only. The Melanoma panel offered by [facility] includes sequencing and/or deletion duplication testing of the following 12 genes: BAP1, BRCA1, BRCA2, BRIP1, CDK4, CDKN2A (p14ARF),  CDKN2A (p16INK4a), MC1R, POT1, PTEN, RB1, TERT, and TP53.  The following gene was evaluated for sequence changes only: MITF (c.952G>A, p.GLU318Lys variant only).     FAMILY HISTORY:  We obtained a detailed, 4-generation family history.  Significant diagnoses are listed below: Family History  Problem Relation Age of Onset  . Prostate cancer Father        dx. in his 34s  . Prostate cancer Brother        dx. in his mid-60s  . Breast cancer Sister 31       dx. in her 74s  . Dementia Mother   . Cancer Maternal Uncle        dx. in his 28s; unknown primary  . Cancer Maternal Uncle        dx. in his 29s; unknown primary  . Cancer Cousin        dx. >50; blood cancer   Mr. Kumpf has two sons (ages 35 and 13) and one daughter (age 68). He has one brother and one sister. His sister had a history of breast cancer diagnosed in her 87s and died in her 1s. His brother was diagnosed with prostate cancer in his mid-45s, which was treated with brachytherapy and hormone therapy.  Mr. Elsayed mother died at the age of 29 with dementia and did not have a history of cancer. He has two maternal aunts and three maternal uncles. Two of his uncles were diagnosed with cancer in their 55s (unknown primaries). He also had a maternal first cousin who was diagnosed with a blood cancer when he was older than 4. Mr. Eline maternal grandparents both died in their 10s and did not have cancer.  Mr. Zollner father died at the age of 89 and had a history of  prostate cancer diagnosed in his 27s. Mr. Lisbon had one paternal aunt and three paternal uncles. All died older than 98 and did not have cancer. His paternal grandfather died in his 67s, and his paternal grandfather died older than 22. Neither had cancer to his knowledge.  Mr. Urton is unaware of previous family history of genetic testing for hereditary cancer risks. Patient's maternal ancestors are of [place] descent, and paternal ancestors are of [place]  descent. There is no reported Ashkenazi Jewish ancestry. There is no known consanguinity.  GENETIC TEST RESULTS: Genetic testing reported out on 01/17/2020 through the [facility] Common Hereditary Cancers + Melanoma panel. No pathogenic variants were detected.   The Common Hereditary Cancers Panel offered by [facility] includes sequencing and/or deletion duplication testing of the following 48 genes: APC, ATM, AXIN2, BARD1, BMPR1A, BRCA1, BRCA2, BRIP1, CDH1, CDK4, CDKN2A (p14ARF), CDKN2A (p16INK4a), CHEK2, CTNNA1, DICER1, EPCAM (Deletion/duplication testing only), GREM1 (promoter region deletion/duplication testing only), KIT, MEN1, MLH1, MSH2, MSH3, MSH6, MUTYH, NBN, NF1, NTHL1, PALB2, PDGFRA, PMS2, POLD1, POLE, PTEN, RAD50, RAD51C, RAD51D, RNF43, SDHB, SDHC, SDHD, SMAD4, SMARCA4. STK11, TP53, TSC1, TSC2, and VHL.  The following genes were evaluated for sequence changes only: SDHA and HOXB13 c.251G>A variant only. The Melanoma panel offered by [facility] includes sequencing and/or deletion duplication testing of the following 12 genes: BAP1, BRCA1, BRCA2, BRIP1, CDK4, CDKN2A (p14ARF), CDKN2A (p16INK4a), MC1R, POT1, PTEN, RB1, TERT, and TP53.  The following gene was evaluated for sequence changes only: MITF (c.952G>A, p.GLU318Lys variant only). The test report will be scanned into EPIC and located under the Molecular Pathology section of the Results Review tab.  A portion of the result report is included below for reference.     We discussed with Mr. Arata that because current genetic testing is not perfect, it is possible there may be a gene mutation in one of these genes that current testing cannot detect, but that chance is small.  We also discussed that there could be another gene that has not yet been discovered, or that we have not yet tested, that is responsible for the cancer diagnoses in the family. It is also possible there is a hereditary cause for the cancer in the family that Mr. Durnil did not inherit  and therefore was not identified in his testing.  Therefore, it is important to remain in touch with cancer genetics in the future so that we can continue to offer Mr. Nephew the most up to date genetic testing.   Genetic testing did identify a variant of uncertain significance (VUS) in the POLE gene called c.2090C>T.  At this time, it is unknown if this variant is associated with increased cancer risk or if this is a normal finding, but most variants such as this get reclassified to being inconsequential. It should not be used to make medical management decisions. With time, we suspect the lab will determine the significance of this variant, if any. If we do learn more about it, we will try to contact Mr. Helfand to discuss it further. However, it is important to stay in touch with [place] periodically and keep the address and phone number up to date.  CANCER SCREENING RECOMMENDATIONS: Mr. Hollar test result is considered negative (normal).  This means that we have not identified a hereditary cause for his personal and family history of cancer at this time. While reassuring, this does not definitively rule out a hereditary predisposition to cancer. It is still possible that there could be genetic mutations that are undetectable  by current technology. There could be genetic mutations in genes that have not been tested or identified to increase cancer risk.  Therefore, it is recommended he continue to follow the cancer management and screening guidelines provided by his oncology and primary healthcare providers.   An individual's cancer risk and medical management are not determined by genetic test results alone. Overall cancer risk assessment incorporates additional factors, including personal medical history, family history, and any available genetic information that may result in a personalized plan for cancer prevention and surveillance.  RECOMMENDATIONS FOR FAMILY MEMBERS:  Individuals in this family might  be at some increased risk of developing cancer, over the general population risk, simply due to the family history of cancer.  We recommended women in this family have a yearly mammogram beginning at age 69, or 54 years younger than the earliest onset of cancer, an annual clinical breast exam, and perform monthly breast self-exams. Women in this family should also have a gynecological exam as recommended by their primary provider. Men in the family should consider having prostate cancer screening.  Per the NCCN Guidelines (Colorectal Cancer Screening Guidelines, Version 2.2021), all first-degree relatives should have a colonoscopy every 5 years, or as determined by their GI doctors, beginning at age 57 (or 27 years younger than the earliest diagnosis, whichever is first).  It is also possible there is a hereditary cause for the cancer in Mr. Tessler family that he did not inherit and therefore was not identified in him.  Based on Mr. Cederberg family history, we recommended his brother, who was diagnosed with prostate cancer in his mid-66s, have genetic counseling and testing. Mr. Heeter will let us know if we can be of any assistance in coordinating genetic counseling and/or testing for this family member.   FOLLOW-UP: Lastly, we discussed with Mr. Bergland that cancer genetics is a rapidly advancing field and it is possible that new genetic tests will be appropriate for him and/or his family members in the future. We encouraged him to remain in contact with cancer genetics on an annual basis so we can update his personal and family histories and let him know of advances in cancer genetics that may benefit this family.   Our contact number was provided. Mr. Becvar questions were answered to his satisfaction, and he knows he is welcome to call us at anytime with additional questions or concerns.   Clint Guy, MS, [facility] Genetic Counselor [email or web address] Phone: [phone]

## 2020-02-03 ENCOUNTER — Other Ambulatory Visit: Payer: Self-pay

## 2020-02-03 DIAGNOSIS — I714 Abdominal aortic aneurysm, without rupture, unspecified: Secondary | ICD-10-CM

## 2020-02-03 DIAGNOSIS — Z85038 Personal history of other malignant neoplasm of large intestine: Secondary | ICD-10-CM | POA: Diagnosis not present

## 2020-02-03 DIAGNOSIS — Z09 Encounter for follow-up examination after completed treatment for conditions other than malignant neoplasm: Secondary | ICD-10-CM | POA: Diagnosis not present

## 2020-02-03 DIAGNOSIS — Z9889 Other specified postprocedural states: Secondary | ICD-10-CM | POA: Diagnosis not present

## 2020-02-06 ENCOUNTER — Other Ambulatory Visit: Payer: Self-pay

## 2020-02-06 ENCOUNTER — Ambulatory Visit (INDEPENDENT_AMBULATORY_CARE_PROVIDER_SITE_OTHER): Payer: Self-pay | Admitting: Vascular Surgery

## 2020-02-06 ENCOUNTER — Ambulatory Visit
Admission: RE | Admit: 2020-02-06 | Discharge: 2020-02-06 | Disposition: A | Discharge status: Home / Self Care | Payer: Medicare Other | Source: Ambulatory Visit | Attending: Vascular Surgery | Admitting: Vascular Surgery

## 2020-02-06 ENCOUNTER — Encounter: Payer: Self-pay | Admitting: Vascular Surgery

## 2020-02-06 VITALS — BP 146/94 | HR 102 | Temp 101.9°F | Resp 20 | Ht 68.0 in | Wt 165.0 lb

## 2020-02-06 DIAGNOSIS — K5730 Diverticulosis of large intestine without perforation or abscess without bleeding: Secondary | ICD-10-CM | POA: Diagnosis not present

## 2020-02-06 DIAGNOSIS — R52 Pain, unspecified: Secondary | ICD-10-CM

## 2020-02-06 DIAGNOSIS — I745 Embolism and thrombosis of iliac artery: Secondary | ICD-10-CM | POA: Diagnosis not present

## 2020-02-06 DIAGNOSIS — I714 Abdominal aortic aneurysm, without rupture, unspecified: Secondary | ICD-10-CM

## 2020-02-06 DIAGNOSIS — I723 Aneurysm of iliac artery: Secondary | ICD-10-CM | POA: Diagnosis not present

## 2020-02-06 DIAGNOSIS — N390 Urinary tract infection, site not specified: Secondary | ICD-10-CM

## 2020-02-06 DIAGNOSIS — I700 Atherosclerosis of aorta: Secondary | ICD-10-CM | POA: Diagnosis not present

## 2020-02-06 MED ORDER — CIPROFLOXACIN HCL 500 MG PO TABS
500.0000 mg | ORAL_TABLET | Freq: Two times a day (BID) | ORAL | 0 refills | Status: AC
Start: 2020-02-06 — End: 2020-02-13

## 2020-02-06 MED ORDER — IOPAMIDOL (ISOVUE-370) INJECTION 76%
75.0000 mL | Freq: Once | INTRAVENOUS | Status: AC | PRN
  Administered 2020-02-06: 75 mL via INTRAVENOUS

## 2020-02-06 NOTE — Progress Notes (Signed)
    Patient is a 79 year old male status post aneurysm stent graft repair by Dr. [name] about 2 weeks ago.  Over the last 24 to 48 hours he has noticed increased pain in his abdomen and back pain.  He is also had some frequency and urgency with urination.  He is also noticed some chills.  Physical exam:  Vitals:   02/06/20 1447  BP: (!) 146/94  Pulse: 102  Resp: 20  Temp: (!) 101.9 F (38.8 C)  SpO2: 94%  Weight: 165 lb (74.8 kg)  Height: 5\' 8"  (1.727 m)    Skin: Warm  Abdomen: Soft mild left side tenderness mild left and right groin tenderness no significant erythema aneurysm sac is slightly pulsatile  Data: Patient had a CT angiogram of the abdomen and pelvis today.  This showed no evidence of proximal or distal type I or type II endoleak.  No other significant abdominal pathology.  Assessment: Patient most likely with urinary tract infection  Plan: Patient was sent for urinalysis and urine culture today.  He will also be started on Cipro 500 mg twice daily for 7-day course.  Ruta Hinds, MD [facility] Office: [phone]

## 2020-02-07 LAB — URINE CULTURE
MICRO NUMBER:: 10738809
SPECIMEN QUALITY:: ADEQUATE

## 2020-02-11 ENCOUNTER — Telehealth: Payer: Self-pay

## 2020-02-11 NOTE — Telephone Encounter (Signed)
Pt called to find out results of his U/A from last week. He is still experiencing intermittent pain in abdomen and back since before surgery and has increased since surgery. Pt denies fever; swelling; incision is clean dry and intact and without any concerns. Pt thinks it may be a urology issue. He is going to complete his antibiotics and call us later this week if he is still having the discomfort.

## 2020-02-12 ENCOUNTER — Emergency Department (HOSPITAL_COMMUNITY): Payer: Medicare Other

## 2020-02-12 ENCOUNTER — Emergency Department (HOSPITAL_COMMUNITY)
Admission: EM | Admit: 2020-02-12 | Discharge: 2020-02-12 | Disposition: A | Discharge status: Home / Self Care | Payer: Medicare Other | Attending: Emergency Medicine | Admitting: Emergency Medicine

## 2020-02-12 ENCOUNTER — Encounter (HOSPITAL_COMMUNITY): Payer: Self-pay

## 2020-02-12 ENCOUNTER — Other Ambulatory Visit: Payer: Self-pay

## 2020-02-12 DIAGNOSIS — Z87891 Personal history of nicotine dependence: Secondary | ICD-10-CM | POA: Diagnosis not present

## 2020-02-12 DIAGNOSIS — Z96611 Presence of right artificial shoulder joint: Secondary | ICD-10-CM | POA: Diagnosis not present

## 2020-02-12 DIAGNOSIS — I723 Aneurysm of iliac artery: Secondary | ICD-10-CM | POA: Diagnosis not present

## 2020-02-12 DIAGNOSIS — N281 Cyst of kidney, acquired: Secondary | ICD-10-CM | POA: Diagnosis not present

## 2020-02-12 DIAGNOSIS — R58 Hemorrhage, not elsewhere classified: Secondary | ICD-10-CM | POA: Diagnosis not present

## 2020-02-12 DIAGNOSIS — Z7982 Long term (current) use of aspirin: Secondary | ICD-10-CM | POA: Insufficient documentation

## 2020-02-12 DIAGNOSIS — Z96641 Presence of right artificial hip joint: Secondary | ICD-10-CM | POA: Insufficient documentation

## 2020-02-12 DIAGNOSIS — I714 Abdominal aortic aneurysm, without rupture: Secondary | ICD-10-CM | POA: Diagnosis not present

## 2020-02-12 DIAGNOSIS — R1030 Lower abdominal pain, unspecified: Secondary | ICD-10-CM | POA: Diagnosis not present

## 2020-02-12 LAB — COMPREHENSIVE METABOLIC PANEL
ALT: 21 U/L (ref 0–44)
AST: 29 U/L (ref 15–41)
Albumin: 3.1 g/dL — ABNORMAL LOW (ref 3.5–5.0)
Alkaline Phosphatase: 66 U/L (ref 38–126)
Anion gap: 13 (ref 5–15)
BUN: 14 mg/dL (ref 8–23)
CO2: 27 mmol/L (ref 22–32)
Calcium: 8.3 mg/dL — ABNORMAL LOW (ref 8.9–10.3)
Chloride: 98 mmol/L (ref 98–111)
Creatinine, Ser: 0.89 mg/dL (ref 0.61–1.24)
GFR calc Af Amer: >60 mL/min (ref >60)
GFR calc non Af Amer: >60 mL/min (ref >60)
Glucose, Bld: 117 mg/dL — ABNORMAL HIGH (ref 70–99)
Potassium: 4.6 mmol/L (ref 3.5–5.1)
Sodium: 138 mmol/L (ref 135–145)
Total Bilirubin: 1 mg/dL (ref 0.3–1.2)
Total Protein: 7 g/dL (ref 6.5–8.1)

## 2020-02-12 LAB — URINALYSIS, ROUTINE W REFLEX MICROSCOPIC
Bilirubin Urine: NEGATIVE
Glucose, UA: NEGATIVE mg/dL
Hgb urine dipstick: NEGATIVE
Ketones, ur: 5 mg/dL — AB
Leukocytes,Ua: NEGATIVE
Nitrite: NEGATIVE
Protein, ur: 30 mg/dL — AB
Specific Gravity, Urine: 1.026 (ref 1.005–1.030)
pH: 5 (ref 5.0–8.0)

## 2020-02-12 LAB — CBC
HCT: 40.8 % (ref 39.0–52.0)
Hemoglobin: 11.9 g/dL — ABNORMAL LOW (ref 13.0–17.0)
MCH: 22.9 pg — ABNORMAL LOW (ref 26.0–34.0)
MCHC: 29.2 g/dL — ABNORMAL LOW (ref 30.0–36.0)
MCV: 78.6 fL — ABNORMAL LOW (ref 80.0–100.0)
Platelets: 315 10*3/uL (ref 150–400)
RBC: 5.19 MIL/uL (ref 4.22–5.81)
WBC: 7.7 10*3/uL (ref 4.0–10.5)
nRBC: 0 % (ref 0.0–0.2)

## 2020-02-12 LAB — LIPASE, BLOOD: Lipase: 21 U/L (ref 11–51)

## 2020-02-12 MED ORDER — SODIUM CHLORIDE 0.9% FLUSH
3.0000 mL | Freq: Once | INTRAVENOUS | Status: AC
  Administered 2020-02-12: 3 mL via INTRAVENOUS

## 2020-02-12 MED ORDER — IOHEXOL 350 MG/ML SOLN
100.0000 mL | Freq: Once | INTRAVENOUS | Status: AC | PRN
  Administered 2020-02-12: 100 mL via INTRAVENOUS

## 2020-02-12 MED ORDER — SODIUM CHLORIDE (PF) 0.9 % IJ SOLN
INTRAMUSCULAR | Status: AC
  Filled 2020-02-12: qty 50

## 2020-02-12 MED ORDER — OXYCODONE-ACETAMINOPHEN 5-325 MG PO TABS: 1.0000 | ORAL_TABLET | Freq: Four times a day (QID) | ORAL | 0 refills | Status: DC | PRN

## 2020-02-12 MED ORDER — FENTANYL CITRATE (PF) 100 MCG/2ML IJ SOLN
50.0000 ug | Freq: Once | INTRAMUSCULAR | Status: AC
  Administered 2020-02-12: 50 ug via INTRAVENOUS
  Filled 2020-02-12: qty 2

## 2020-02-12 NOTE — ED Provider Notes (Signed)
[facility] DEPT Provider Note   CSN: [id number] Arrival date & time: 02/12/20  1048     History Chief Complaint  Patient presents with  . Abdominal Pain    Aaron Hawkins is a 79 y.o. male.  HPI Patient presents with lower abdominal pain.  Has had for around the last week.  It comes and goes in severity.  3 weeks ago had a aortic stent graft for AAA.  A week ago had been seen at the vascular surgeons office and had a CT scan done that was reassuring.  No clear cause of pain and stent graft without leak.  Urine had potentially shown infection and started on ciprofloxacin.  Given a week course and still has 2 pills left.  No nausea or vomiting.  States a week ago he had chills but since then really has not had it.  Has had some nonspecific urinary symptoms.  States this was going on before the aortic surgery.    Past Medical History:  Diagnosis Date  . AAA (abdominal aortic aneurysm) ([place])   . Anemia   . Arthritis    "probably in my knees and right shoulder" (11/22/2017)  . Duodenal ulcer 1964  . Family history of breast cancer   . Family history of prostate cancer   . History of Guillain-Barre syndrome 2003  . History of hiatal hernia    per patient about 20 years ago  . History of hydronephrosis    W/ STRICTURE - not aware of this  . History of melanoma   . History of melanoma excision    BACK-- 04-04-2007 W/ SLN BX RIGHT AXILL, right arm 2017  . History of nephrolithiasis    right kidney  . Microscopic hematuria    history of  . 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 Skin cancer 04/04/2007; 2017   "back, right arm"    Patient Active Problem List   Diagnosis Date Noted  . Genetic testing 01/24/2020  . AAA (abdominal aortic aneurysm) ([place]) 01/22/2020  . History of melanoma   . Family history of prostate cancer   . Family history of breast cancer   . Iron deficiency anemia 12/20/2019  . AAA (abdominal aortic  aneurysm) without rupture ([place]) 12/06/2019  . History of nephrolithiasis   . History of hiatal hernia   . Arthritis   . 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 Prostate cancer ([place])   . History of Guillain-Barre syndrome 2003  . Duodenal ulcer 1964    Past Surgical History:  Procedure Laterality Date  . ABDOMINAL AORTIC ENDOVASCULAR STENT GRAFT N/A 01/22/2020   Procedure: ABDOMINAL AORTIC ENDOVASCULAR STENT GRAFT repair;  Surgeon: [name], MD;  [facility] OR;  Service: Vascular;  Laterality: N/A;  . CATARACT EXTRACTION W/ INTRAOCULAR LENS  IMPLANT, BILATERAL Bilateral   . COLON SURGERY     removed intestine  colon ca  . HERNIA REPAIR  [id number]   w/mesh, umbilical  . INGUINAL HERNIA REPAIR Bilateral 1990's  . JOINT REPLACEMENT    . MELANOMA EXCISION  04/04/2007; 2017   back W/ SLN BX RIGHT AXILL; right arm  . PROSTATE BIOPSY  01/02/13   benign, one small focus of atypical cells right core  . PROSTATE BIOPSY  05/30/13   Gleason 3+3=6, volume 49 cc  . RADIOACTIVE SEED IMPLANT N/A 08/08/2013   Procedure: RADIOACTIVE SEED  IMPLANT;  Surgeon: Molli Hazard, MD;  Location[facility];  Service: Urology;  Laterality: N/A;   79 SEED IMPLANTED NO SEEDS FOUND IN BLADDER  . TONSILLECTOMY AND ADENOIDECTOMY  childhood  . TOTAL HIP ARTHROPLASTY Right 01-06-2009  . TOTAL SHOULDER ARTHROPLASTY Right 11/22/2017  . TOTAL SHOULDER ARTHROPLASTY Right 11/22/2017   Procedure: TOTAL SHOULDER ARTHROPLASTY;  Surgeon: Hiram Gash, MD;  Location: [facility];  Service: Orthopedics;  Laterality: Right;  . ULTRASOUND GUIDANCE FOR VASCULAR ACCESS Bilateral 01/22/2020   Procedure: ULTRASOUND GUIDANCE FOR VASCULAR ACCESS, bilateral femoral arteries;  Surgeon: [name], MD;  Location: [facility] OR;  Service: Vascular;  Laterality: Bilateral;       Family History  Problem Relation Age of Onset  .  Prostate cancer Father        dx. in his 46s  . Prostate cancer Brother        dx. in his mid-60s  . Breast cancer Sister 58       dx. in her 61s  . Dementia Mother   . Cancer Maternal Uncle        dx. in his 37s; unknown primary  . Cancer Maternal Uncle        dx. in his 8s; unknown primary  . Cancer Cousin        dx. >50; blood cancer    Social History   Tobacco Use  . Smoking status: Former Smoker    Packs/day: 1.00    Years: 7.00    Pack years: 7.00    Types: Cigarettes    Quit date: 06/18/1966    Years since quitting: 53.6  . Smokeless tobacco: Never Used  Vaping Use  . Vaping Use: Never used  Substance Use Topics  . Alcohol use: Yes    Alcohol/week: 2.0 standard drinks    Types: 2 Glasses of wine per week    Comment: occ  . Drug use: No    Home Medications Prior to Admission medications   Medication Sig Start Date End Date Taking? Authorizing Provider  acetaminophen (TYLENOL) 500 MG tablet Take 500 mg by mouth every 6 (six) hours as needed for moderate pain.    Yes [provider]  ciprofloxacin (CIPRO) 500 MG tablet Take 1 tablet (500 mg total) by mouth 2 (two) times daily for 7 days. 02/06/20 02/13/20 Yes Fields, Jessy Oto, MD  Polyvinyl Alcohol-Povidone (REFRESH OP) Place 2 drops into both eyes daily as needed (dry/irritated eyes).    Yes [provider]  aspirin EC 81 MG EC tablet Take 1 tablet (81 mg total) by mouth daily at 6 (six) AM. Swallow whole. 01/24/20   Rhyne, Hulen Shouts, PA-C  Multiple Vitamins-Minerals (PRESERVISION AREDS 2) CAPS Take 1 capsule by mouth daily.     [provider]  oxyCODONE-acetaminophen (PERCOCET) 5-325 MG tablet Take 1 tablet by mouth every 6 (six) hours as needed. 02/12/20   Davonna Belling, MD  rosuvastatin (CRESTOR) 10 MG tablet Take 1 tablet (10 mg total) by mouth daily. 01/23/20   Gabriel Earing, PA-C    Allergies    Patient has no known allergies.  Review of Systems   Review of Systems    Constitutional: Negative for appetite change and fever.  HENT: Negative for congestion.   Cardiovascular: Negative for chest pain.  Gastrointestinal: Positive for abdominal pain.  Genitourinary: Negative for flank pain.  Musculoskeletal: Negative for back pain.  Skin: Negative for rash.  Neurological: Negative for weakness.  Hematological: Bruises/bleeds  easily.    Physical Exam Updated Vital Signs BP (!) 180/79 (BP Location: Right Arm)   Pulse 77   Temp 98.2 F (36.8 C) (Oral)   Resp 16   SpO2 93%   Physical Exam Vitals and nursing note reviewed.  HENT:     Head: Atraumatic.  Eyes:     Pupils: Pupils are equal, round, and reactive to light.  Cardiovascular:     Rate and Rhythm: Normal rate.  Pulmonary:     Breath sounds: Normal breath sounds.  Abdominal:     Comments: Lower abdominal tenderness.  No rebound or guarding.  Midline scar from previous colon surgery.  Also bilateral inguinal area of bruising from recent AAA repair.  No thrill.  No palpable mass.  No hernia palpated.  Genitourinary:    Comments: No perineal or testicular tenderness. Skin:    General: Skin is warm.     Capillary Refill: Capillary refill takes less than 2 seconds.  Neurological:     Mental Status: He is alert and oriented to person, place, and time.     ED Results / Procedures / Treatments   Labs (all labs ordered are listed, but only abnormal results are displayed) Labs Reviewed  COMPREHENSIVE METABOLIC PANEL - Abnormal; Notable for the following components:      Result Value   Glucose, Bld 117 (*)    Calcium 8.3 (*)    Albumin 3.1 (*)    All other components within normal limits  CBC - Abnormal; Notable for the following components:   Hemoglobin 11.9 (*)    MCV 78.6 (*)    MCH 22.9 (*)    MCHC 29.2 (*)    All other components within normal limits  URINALYSIS, ROUTINE W REFLEX MICROSCOPIC - Abnormal; Notable for the following components:   Ketones, ur 5 (*)    Protein, ur 30  (*)    Bacteria, UA RARE (*)    All other components within normal limits  LIPASE, BLOOD    EKG None  Radiology CT Angio Abd/Pel W and/or Wo Contrast  Result Date: 02/12/2020 CLINICAL DATA:  Abdominal pain.  Evaluate for aortic dissection. EXAM: CT ANGIOGRAPHY ABDOMEN AND PELVIS WITH CONTRAST AND WITHOUT CONTRAST TECHNIQUE: Multidetector CT imaging of the abdomen and pelvis was performed using the standard protocol during bolus administration of intravenous contrast. Multiplanar reconstructed images and MIPs were obtained and reviewed to evaluate the vascular anatomy. CONTRAST:  167mL OMNIPAQUE IOHEXOL 350 MG/ML SOLN COMPARISON:  01/07/2020 FINDINGS: VASCULAR Aorta: Normal caliber of the distal descending thoracic aorta measuring 2.7 cm without dissection. Again noted is an infrarenal bifurcated aortic stent graft. The aortic stent graft is patent. Dedicated aortic stent graft protocol was not performed on this examination but suspect there is a type 2 endoleak along the distal abdominal aorta on sequence 5 image 63 associated with a lumbar artery. Aortic aneurysm sac measures roughly 5.4 cm and stable from the comparison examination. 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 Main celiac trunk branch vessels are patent. SMA: SMA is patent. Replaced right hepatic artery originating from the proximal SMA. Renals: Bilateral renal arteries are patent. IMA: Patent just beyond the origin. Inflow: Bilateral iliac stent limbs are patent. Limited evaluation of the right common iliac aneurysm sac. Again noted is an elongated aneurysm involving the proximal right internal iliac artery. 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 Right external iliac artery is patent. Left internal and left external iliac arteries are patent. Left external iliac artery is very tortuous. Proximal Outflow: Proximal femoral  arteries are patent bilaterally. Veins: Limited evaluation of the veins on this examination. Review of the MIP images confirms the above findings. NON-VASCULAR Lower chest: Lung bases are clear. Hepatobiliary: No acute abnormality to the liver. Gallbladder is mildly distended without inflammatory changes. Pancreas: Unremarkable. No pancreatic ductal dilatation or surrounding inflammatory changes. Spleen: Normal in size without focal abnormality. Adrenals/Urinary Tract: Normal adrenal glands. Bilateral renal cysts. Evidence for bilateral parapelvic cysts. Limited evaluation of the urinary bladder. Stomach/Bowel: Small hiatal hernia. Duodenum is draped over the abdominal aortic aneurysm sac without inflammatory changes in this area. Surgical bowel anastomosis in the anterior abdomen and suggestive for previous right hemicolectomy. No evidence for bowel inflammation or obstruction. Lymphatic: No significant lymph node enlargement in the abdomen or pelvis. Reproductive: Brachytherapy seeds in the prostate. Other: Difficult to exclude trace ascites in the pelvis. Otherwise, no evidence for ascites. Surgical mesh in the lower abdomen and pelvic region. Musculoskeletal: Right hip is located. Joint space narrowing and degenerative changes at the left hip. IMPRESSION: VASCULAR 1. Endovascular repair of the abdominal aortic aneurysm. The aortic stent graft is patent. Aneurysm sac is stable from the recent comparison examination. 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 This is a non emergent finding and recommend attention to this area on follow up imaging of the aorta. 2. 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 3. No acute vascular abnormality. 4. 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 NON-VASCULAR 1. No acute abnormality in the abdomen or pelvis.  Difficult to exclude trace pelvic fluid. 2. Partial colectomy.  Evidence for surgical mesh material. Electronically Signed   By: Markus Daft M.D.   On: 02/12/2020 15:12    Procedures Procedures (including critical care time)  Medications Ordered in ED Medications  sodium chloride flush (NS) 0.9 % injection 3 mL (3 mLs Intravenous Given 02/12/20 1219)  iohexol (OMNIPAQUE) 350 MG/ML injection 100 mL (100 mLs Intravenous Contrast Given 02/12/20 1441)  sodium chloride (PF) 0.9 % injection (  Given by Other 02/12/20 1427)  fentaNYL (SUBLIMAZE) injection 50 mcg (50 mcg Intravenous Given 02/12/20 1521)    ED Course  I have reviewed the triage vital signs and the nursing notes.  Pertinent labs & imaging results that were available during my care of the patient were reviewed by me and considered in my medical decision making (see chart for details).    MDM Rules/Calculators/A&P                          Patient with abdominal pain.  Had recent aortic stent graft repair.  About a week ago developed pain.  Has been seen in vascular surgery office.  Started on Cipro at that time due to possible UTI.  Continued pain.  On no pain medicines.  May be having some mild urinary symptoms.  No change in bowel habits.  Also had recent colon surgery.  CT angiography done.  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  Otherwise possibility of trace fluid in the belly.  Lab work reassuring.  Discussed with Dr. [name], who did the surgery.  Will give pain medicine and have him follow-up in the  office.  Will discharge home Final Clinical Impression(s) / ED Diagnoses Final diagnoses:  Lower abdominal pain    Rx / DC Orders ED Discharge Orders         Ordered    oxyCODONE-acetaminophen (PERCOCET) 5-325 MG tablet  Every 6 hours PRN     Discontinue  Reprint     02/12/20 1533           Davonna Belling, MD 02/12/20 1536

## 2020-02-12 NOTE — Discharge Instructions (Signed)
Take MiraLAX once a day if you are on the pain medicines.  Follow-up with Dr. [name].  They should be contacting you to get you in in the next week.  Return for fevers.  Return for uncontrolled pain.

## 2020-02-12 NOTE — ED Notes (Signed)
PVR 7 cc

## 2020-02-12 NOTE — ED Triage Notes (Signed)
C/o lower abdominal pain and unable to sleep X7 days ago.  Denies N/V or chest pain   AAA surgery 3 weeks ago.    A/ox4 Ambulator in triage

## 2020-02-14 DIAGNOSIS — R109 Unspecified abdominal pain: Secondary | ICD-10-CM | POA: Diagnosis not present

## 2020-02-16 ENCOUNTER — Emergency Department (HOSPITAL_COMMUNITY)
Admission: EM | Admit: 2020-02-16 | Discharge: 2020-02-16 | Disposition: A | Discharge status: Home / Self Care | Payer: Medicare Other | Attending: Emergency Medicine | Admitting: Emergency Medicine

## 2020-02-16 ENCOUNTER — Encounter (HOSPITAL_COMMUNITY): Payer: Self-pay

## 2020-02-16 ENCOUNTER — Other Ambulatory Visit: Payer: Self-pay

## 2020-02-16 ENCOUNTER — Emergency Department (HOSPITAL_COMMUNITY): Payer: Medicare Other

## 2020-02-16 DIAGNOSIS — I4510 Unspecified right bundle-branch block: Secondary | ICD-10-CM | POA: Diagnosis not present

## 2020-02-16 DIAGNOSIS — Z96611 Presence of right artificial shoulder joint: Secondary | ICD-10-CM | POA: Diagnosis not present

## 2020-02-16 DIAGNOSIS — K5730 Diverticulosis of large intestine without perforation or abscess without bleeding: Secondary | ICD-10-CM | POA: Diagnosis not present

## 2020-02-16 DIAGNOSIS — K5903 Drug induced constipation: Secondary | ICD-10-CM

## 2020-02-16 DIAGNOSIS — K5909 Other constipation: Secondary | ICD-10-CM | POA: Insufficient documentation

## 2020-02-16 DIAGNOSIS — I700 Atherosclerosis of aorta: Secondary | ICD-10-CM | POA: Diagnosis not present

## 2020-02-16 DIAGNOSIS — Z87891 Personal history of nicotine dependence: Secondary | ICD-10-CM | POA: Insufficient documentation

## 2020-02-16 DIAGNOSIS — R0902 Hypoxemia: Secondary | ICD-10-CM | POA: Diagnosis not present

## 2020-02-16 DIAGNOSIS — R1084 Generalized abdominal pain: Secondary | ICD-10-CM | POA: Diagnosis not present

## 2020-02-16 DIAGNOSIS — R1030 Lower abdominal pain, unspecified: Secondary | ICD-10-CM | POA: Diagnosis present

## 2020-02-16 DIAGNOSIS — R52 Pain, unspecified: Secondary | ICD-10-CM | POA: Diagnosis not present

## 2020-02-16 DIAGNOSIS — R1031 Right lower quadrant pain: Secondary | ICD-10-CM | POA: Diagnosis not present

## 2020-02-16 DIAGNOSIS — I712 Thoracic aortic aneurysm, without rupture: Secondary | ICD-10-CM | POA: Diagnosis not present

## 2020-02-16 DIAGNOSIS — I724 Aneurysm of artery of lower extremity: Secondary | ICD-10-CM | POA: Diagnosis not present

## 2020-02-16 LAB — COMPREHENSIVE METABOLIC PANEL
ALT: 16 U/L (ref 0–44)
AST: 13 U/L — ABNORMAL LOW (ref 15–41)
Albumin: 2.5 g/dL — ABNORMAL LOW (ref 3.5–5.0)
Alkaline Phosphatase: 64 U/L (ref 38–126)
Anion gap: 11 (ref 5–15)
BUN: 11 mg/dL (ref 8–23)
CO2: 30 mmol/L (ref 22–32)
Calcium: 8.8 mg/dL — ABNORMAL LOW (ref 8.9–10.3)
Chloride: 95 mmol/L — ABNORMAL LOW (ref 98–111)
Creatinine, Ser: 0.93 mg/dL (ref 0.61–1.24)
GFR calc Af Amer: >60 mL/min (ref >60)
GFR calc non Af Amer: >60 mL/min (ref >60)
Glucose, Bld: 127 mg/dL — ABNORMAL HIGH (ref 70–99)
Potassium: 4.1 mmol/L (ref 3.5–5.1)
Sodium: 136 mmol/L (ref 135–145)
Total Bilirubin: 0.7 mg/dL (ref 0.3–1.2)
Total Protein: 6.1 g/dL — ABNORMAL LOW (ref 6.5–8.1)

## 2020-02-16 LAB — CBC WITH DIFFERENTIAL/PLATELET
Abs Immature Granulocytes: 0.03 10*3/uL (ref 0.00–0.07)
Basophils Absolute: 0 10*3/uL (ref 0.0–0.1)
Basophils Relative: 0 %
Eosinophils Absolute: 0 10*3/uL (ref 0.0–0.5)
Eosinophils Relative: 0 %
HCT: 37.1 % — ABNORMAL LOW (ref 39.0–52.0)
Hemoglobin: 11.2 g/dL — ABNORMAL LOW (ref 13.0–17.0)
Immature Granulocytes: 0 %
Lymphocytes Relative: 10 %
Lymphs Abs: 0.7 10*3/uL (ref 0.7–4.0)
MCH: 23.5 pg — ABNORMAL LOW (ref 26.0–34.0)
MCHC: 30.2 g/dL (ref 30.0–36.0)
MCV: 77.9 fL — ABNORMAL LOW (ref 80.0–100.0)
Monocytes Absolute: 0.7 10*3/uL (ref 0.1–1.0)
Monocytes Relative: 10 %
Neutro Abs: 5.5 10*3/uL (ref 1.7–7.7)
Neutrophils Relative %: 80 %
Platelets: 345 10*3/uL (ref 150–400)
RBC: 4.76 MIL/uL (ref 4.22–5.81)
RDW: 27.9 % — ABNORMAL HIGH (ref 11.5–15.5)
WBC: 7 10*3/uL (ref 4.0–10.5)
nRBC: 0 % (ref 0.0–0.2)

## 2020-02-16 LAB — URINALYSIS, ROUTINE W REFLEX MICROSCOPIC
Bacteria, UA: NONE SEEN
Bilirubin Urine: NEGATIVE
Glucose, UA: NEGATIVE mg/dL
Ketones, ur: NEGATIVE mg/dL
Leukocytes,Ua: NEGATIVE
Nitrite: NEGATIVE
Protein, ur: NEGATIVE mg/dL
Specific Gravity, Urine: 1.019 (ref 1.005–1.030)
pH: 6 (ref 5.0–8.0)

## 2020-02-16 LAB — LIPASE, BLOOD: Lipase: 22 U/L (ref 11–51)

## 2020-02-16 MED ORDER — HYDROMORPHONE HCL 1 MG/ML IJ SOLN
0.5000 mg | Freq: Once | INTRAMUSCULAR | Status: AC
  Administered 2020-02-16: 0.5 mg via INTRAVENOUS
  Filled 2020-02-16: qty 1

## 2020-02-16 MED ORDER — IOHEXOL 350 MG/ML SOLN
100.0000 mL | Freq: Once | INTRAVENOUS | Status: AC | PRN
  Administered 2020-02-16: 100 mL via INTRAVENOUS

## 2020-02-16 MED ORDER — POLYETHYLENE GLYCOL 3350 17 G PO PACK: 17.0000 g | PACK | Freq: Every day | ORAL | 0 refills | Status: DC

## 2020-02-16 NOTE — ED Notes (Signed)
Verbalized understanding of DC instructions, Rx, follow up care with PCP and vascular

## 2020-02-16 NOTE — ED Triage Notes (Signed)
Pt arrived to ED via [facility] d/t 10/10 pain felt like an "intertube" around his body approx. between his groin/navel (per pt). He recently had a AAA stent placed on 01/22/20. 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 Upon arrival to ED pt is AS/Ox4, verbal-able to make needs known. At this moment he rates his overall pain 5/10.

## 2020-02-16 NOTE — ED Provider Notes (Signed)
Patient received at handoff from [facility] due to shift change.  She provided HPI and current work-up please see her note for full detail. Physical Exam  BP (!) 144/65   Pulse 71   Temp (!) 97 F (36.1 C)   Resp (!) 11   SpO2 95%   Physical Exam  ED Course/Procedures     Procedures  MDM  I have personally reviewed all imaging, labs and have interpreted them.  UA look reassuring no signs of UTI or pyelonephritis, vital signs are reassuring, patient appears stable for discharge.  Will discharge home with plans per [facility]       Marcello Fennel, PA-C 02/16/20 [place], [place], DO 02/16/20 1705

## 2020-02-16 NOTE — Discharge Instructions (Addendum)
Your work-up today was suggestive of constipation. I suspect that to some degree this is causing your pain. You are likely constipated as a side effect of the pain medicines you have been taking.  I would recommend drinking 7 capfuls of MiraLAX in 32 ounces of the liquid of your choice today. After that you can take 1-2 capfuls of MiraLAX in 8 to 16 ounces of fluid daily. Continue to take your stool softener as prescribed. Drink plenty of water and try to be as active as you can which can help regulate your bowel movements.  If you remain constipated after the MiraLAX today, you can drink a half of a bottle of magnesium citrate. Wait a few hours and if needed you can drink the other half.  You can continue to take the pain medicines as prescribed but be aware that constipation is a common side effect of the oxycodone. You can take 1 to 2 tablets of Tylenol every 6 hours additionally as needed for pain. Do not exceed more than 4000 mg of Tylenol daily.  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 Dr. Doren Custard with vascular surgery personally reviewed your images and is reassured that there will likely not need to be any additional interventions as the aneurysm is stable/unchanged in size.  Follow-up with Dr. [name] as scheduled on Tuesday for reevaluation of your symptoms. Return to the emergency department if any concerning signs or symptoms develop such as fevers, persistent vomiting, worsening pain, loss of pulses or loss of consciousness.

## 2020-02-16 NOTE — ED Provider Notes (Signed)
[facility] EMERGENCY DEPARTMENT Provider Note   CSN: [id number] Arrival date & time: 02/16/20  1226     History Chief Complaint  Patient presents with  . Abdominal Pain    Aaron Hawkins is a 79 y.o. 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  He underwent Gore stent graft repair of his abdominal aortic aneurysm on 01/22/2020 by Dr. [name] which he tolerated without difficulty.  He was discharged the following day.  Since then he has developed severe dull pains to the lower abdomen which wraps all the way around.  No aggravating or alleviating factors noted. He notes that he has been taking Percocet for his pain with initial relief but notes that it seems like it is less effective at this point.  The pain seemed to start around February 04, 2020 and he followed up with Dr. Oneida Alar outpatient on 02/06/2020 after a CTA was done which showed no evidence of proximal or distal type I or type II endoleak at the time.  He was started on Cipro for potential UTI for 7 days.  Subsequently he was seen in the ED on 02/12/2020 with persistent symptoms and had repeat imaging which showed possible type II endoleak but this was incompletely evaluated on the CT scan.  He states that he returns today because last night the pain became unbearable and he could not find a comfortable position.  He tells me that he has not had a bowel movement in 5 days which is unusual for him.  He denies fevers, chest pain, shortness of breath, nausea, vomiting.  He reports that his urinary symptoms have improved.  The history is provided by the patient.       Past Medical History:  Diagnosis Date  . AAA (abdominal aortic aneurysm) ([place])   . Anemia   . Arthritis    "probably in my knees and right shoulder" (11/22/2017)  . Duodenal ulcer  1964  . Family history of breast cancer   . Family history of prostate cancer   . History of Guillain-Barre syndrome 2003  . History of hiatal hernia    per patient about 20 years ago  . History of hydronephrosis    W/ STRICTURE - not aware of this  . History of melanoma   . History of melanoma excision    BACK-- 04-04-2007 W/ SLN BX RIGHT AXILL, right arm 2017  . History of nephrolithiasis    right kidney  . Microscopic hematuria    history of  . Prostate cancer ([place]) 05/30/13   gleason 3+3=6, volume 58 cc  . Skin cancer 04/04/2007; 2017   "back, right arm"    Patient Active Problem List   Diagnosis Date Noted  . Genetic testing 01/24/2020  . AAA (abdominal aortic aneurysm) ([place]) 01/22/2020  . History of melanoma   . Family history of prostate cancer   . Family history of breast cancer   . Iron deficiency anemia 12/20/2019  . AAA (abdominal aortic aneurysm) without rupture ([place]) 12/06/2019  . History of nephrolithiasis   . History of hiatal hernia   . Arthritis   . 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 Prostate cancer ([place])   . History of Guillain-Barre syndrome 2003  . Duodenal ulcer 1964  Past Surgical History:  Procedure Laterality Date  . ABDOMINAL AORTIC ENDOVASCULAR STENT GRAFT N/A 01/22/2020   Procedure: ABDOMINAL AORTIC ENDOVASCULAR STENT GRAFT repair;  Surgeon: [name], MD;  Location: [facility] OR;  Service: Vascular;  Laterality: N/A;  . CATARACT EXTRACTION W/ INTRAOCULAR LENS  IMPLANT, BILATERAL Bilateral   . COLON SURGERY     removed intestine  colon ca  . HERNIA REPAIR  9629'B   w/mesh, umbilical  . INGUINAL HERNIA REPAIR Bilateral 1990's  . JOINT REPLACEMENT    . MELANOMA EXCISION  04/04/2007; 2017   back W/ SLN BX RIGHT AXILL; right arm  . PROSTATE BIOPSY  01/02/13   benign, one small focus of atypical cells right core  . PROSTATE BIOPSY  05/30/13    Gleason 3+3=6, volume 49 cc  . RADIOACTIVE SEED IMPLANT N/A 08/08/2013   Procedure: RADIOACTIVE SEED IMPLANT;  Surgeon: Molli Hazard, MD;  [facility];  Service: Urology;  Laterality: N/A;   32 SEED IMPLANTED NO SEEDS FOUND IN BLADDER  . TONSILLECTOMY AND ADENOIDECTOMY  childhood  . TOTAL HIP ARTHROPLASTY Right 01-06-2009  . TOTAL SHOULDER ARTHROPLASTY Right 11/22/2017  . TOTAL SHOULDER ARTHROPLASTY Right 11/22/2017   Procedure: TOTAL SHOULDER ARTHROPLASTY;  Surgeon: Hiram Gash, MD;  Location: [facility];  Service: Orthopedics;  Laterality: Right;  . ULTRASOUND GUIDANCE FOR VASCULAR ACCESS Bilateral 01/22/2020   Procedure: ULTRASOUND GUIDANCE FOR VASCULAR ACCESS, bilateral femoral arteries;  Surgeon: [name], MD;  Location: [facility] OR;  Service: Vascular;  Laterality: Bilateral;       Family History  Problem Relation Age of Onset  . Prostate cancer Father        dx. in his 107s  . Prostate cancer Brother        dx. in his mid-60s  . Breast cancer Sister 91       dx. in her 21s  . Dementia Mother   . Cancer Maternal Uncle        dx. in his 64s; unknown primary  . Cancer Maternal Uncle        dx. in his 65s; unknown primary  . Cancer Cousin        dx. >50; blood cancer    Social History   Tobacco Use  . Smoking status: Former Smoker    Packs/day: 1.00    Years: 7.00    Pack years: 7.00    Types: Cigarettes    Quit date: 06/18/1966    Years since quitting: 53.7  . Smokeless tobacco: Never Used  Vaping Use  . Vaping Use: Never used  Substance Use Topics  . Alcohol use: Yes    Alcohol/week: 2.0 standard drinks    Types: 2 Glasses of wine per week    Comment: occ  . Drug use: No    Home Medications Prior to Admission medications   Medication Sig Start Date End Date Taking? Authorizing Provider  acetaminophen (TYLENOL) 500 MG tablet Take 500 mg by mouth every 6 (six) hours as needed for moderate pain.     [provider]  aspirin EC  81 MG EC tablet Take 1 tablet (81 mg total) by mouth daily at 6 (six) AM. Swallow whole. 01/24/20   Rhyne, Hulen Shouts, PA-C  Multiple Vitamins-Minerals (PRESERVISION AREDS 2) CAPS Take 1 capsule by mouth daily.     [provider]  oxyCODONE-acetaminophen (PERCOCET) 5-325 MG tablet Take 1 tablet by mouth every 6 (six) hours as needed. 02/12/20   Alvino Chapel,  Ovid Curd, MD  polyethylene glycol (MIRALAX) 17 g packet Take 17 g by mouth daily. 02/16/20   Shamariah Shewmake A, PA-C  Polyvinyl Alcohol-Povidone (REFRESH OP) Place 2 drops into both eyes daily as needed (dry/irritated eyes).     [provider]  rosuvastatin (CRESTOR) 10 MG tablet Take 1 tablet (10 mg total) by mouth daily. 01/23/20   Gabriel Earing, PA-C    Allergies    Patient has no known allergies.  Review of Systems   Review of Systems  Constitutional: Negative for chills and fever.  Respiratory: Negative for shortness of breath.   Cardiovascular: Negative for chest pain.  Gastrointestinal: Positive for abdominal pain. Negative for nausea and vomiting.  Musculoskeletal: Negative for back pain.  All other systems reviewed and are negative.   Physical Exam Updated Vital Signs BP (!) 144/65   Pulse 71   Temp (!) 97 F (36.1 C)   Resp (!) 11   SpO2 95%   Physical Exam Vitals and nursing note reviewed.  Constitutional:      General: He is not in acute distress.    Appearance: He is well-developed.  HENT:     Head: Normocephalic and atraumatic.  Eyes:     General:        Right eye: No discharge.        Left eye: No discharge.     Conjunctiva/sclera: Conjunctivae normal.  Neck:     Vascular: No JVD.     Trachea: No tracheal deviation.  Cardiovascular:     Rate and Rhythm: Normal rate. Rhythm irregular.  Pulmonary:     Effort: Pulmonary effort is normal.     Breath sounds: Normal breath sounds.  Abdominal:     General: A surgical scar is present. Bowel sounds are normal. There is no distension.      Palpations: Abdomen is soft.     Tenderness: There is abdominal tenderness in the right lower quadrant. There is no right CVA tenderness or left CVA tenderness.     Comments: Well-healing surgical incisions  Musculoskeletal:     Comments: No midline lumbar spine tenderness or paralumbar muscle tenderness.  No deformity, crepitus, or step-off.  Skin:    General: Skin is warm.     Findings: No erythema.  Neurological:     Mental Status: He is alert.  Psychiatric:        Behavior: Behavior normal.     ED Results / Procedures / Treatments   Labs (all labs ordered are listed, but only abnormal results are displayed) Labs Reviewed  CBC WITH DIFFERENTIAL/PLATELET - Abnormal; Notable for the following components:      Result Value   Hemoglobin 11.2 (*)    HCT 37.1 (*)    MCV 77.9 (*)    MCH 23.5 (*)    RDW 27.9 (*)    All other components within normal limits  COMPREHENSIVE METABOLIC PANEL - Abnormal; Notable for the following components:   Chloride 95 (*)    Glucose, Bld 127 (*)    Calcium 8.8 (*)    Total Protein 6.1 (*)    Albumin 2.5 (*)    AST 13 (*)    All other components within normal limits  URINALYSIS, ROUTINE W REFLEX MICROSCOPIC - Abnormal; Notable for the following components:   Color, Urine STRAW (*)    Hgb urine dipstick SMALL (*)    All other components within normal limits  URINE CULTURE  LIPASE, BLOOD    EKG EKG Interpretation  Date/Time:  Sunday February 16 2020 12:35:10 EDT Ventricular Rate:  76 PR Interval:    QRS Duration: 121 QT Interval:  400 QTC Calculation: 450 R Axis:   -10 Text Interpretation: Sinus rhythm Supraventricular bigeminy Probable left atrial enlargement Right bundle branch block When comapred to prior, no significant chcanges from prior. No STEMI Confirmed by Antony Blackbird [phone]) on 02/16/2020 12:56:21 PM   Radiology CT Angio Abd/Pel W and/or Wo Contrast  Result Date: 02/16/2020 CLINICAL DATA:  Post abdominal aortic aneurysm  repair, now with abdominal pain. Evaluate for enteric obstruction. EXAM: CTA ABDOMEN AND PELVIS WITHOUT AND WITH CONTRAST TECHNIQUE: Multidetector CT imaging of the abdomen and pelvis was performed using the standard protocol during bolus administration of intravenous contrast. Multiplanar reconstructed images and MIPs were obtained and reviewed to evaluate the vascular anatomy. CONTRAST:  19mL OMNIPAQUE IOHEXOL 350 MG/ML SOLN COMPARISON:  CT abdomen pelvis-02/12/2020; 02/06/2020; 10/31/2019 FINDINGS: VASCULAR Aorta: Stable sequela of endovascular repair of infrarenal abdominal aortic aneurysm. The stent graft remains well apposed against the walls of the infrarenal abdominal aorta as well as the walls of the bilateral common iliac arteries. Review of the precontrast images demonstrates mural calcification involving the superior aspect of the abdominal aortic aneurysm sac (images 14 and 15, series 5). There is persistent opacification of the native abdominal aortic aneurysm sac about its inferior aspect (images 99 and 103, series 6) likely via supply from the median sacral artery (images 104 and 105), the L4 lumbar artery (image 92, series 6) as well as the IMA (image 85, series 6). The native abdominal aortic aneurysm sac is grossly unchanged in size measuring approximately 4.9 x 5.4 x 4.8 cm (axial image 19, series 3; sagittal image 33, series 16; coronal image 26, series 15). Increased attenuation within the native abdominal aortic aneurysm sac may represent evolving thrombus versus staining from recent contrast-enhanced abdominal CT performed 02/12/2020, as contrast is seen within the bilateral renal collecting systems. No definitive periaortic stranding. Celiac: There is a minimal amount of atherosclerotic plaque involving the origin of the celiac artery, not resulting in hemodynamically significant stenosis. 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 SMA: Widely patent without hemodynamically significant narrowing. A replaced right hepatic artery arises from the proximal SMA. Renals: Solitary bilaterally; the bilateral renal arteries appear widely patent without hemodynamically significant narrowing. No vessel irregularity to suggest FMD. IMA: Remains patent and is suspected to at least partially contribute to the type 2 endoleak. Inflow: As above, the distal limbs of the aortic stent graft are well apposed against the walls of the bilateral common iliac arteries. The bilateral common iliac arteries are again noted to be mildly aneurysmal, the right measuring 2.1 cm (coronal image 83, series 9 and the left measuring 1.8 cm (image 88, series 9). 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 No contrast extravasation or perivascular stranding. The left internal iliac artery is diseased though patent and of normal caliber. Proximal Outflow: The bilateral common and imaged portions of the bilateral deep and superficial femoral arteries appear patent throughout their imaged courses. Veins: The IVC and pelvic venous systems appear patent on this arterial phase examination. Review of the MIP images confirms the above findings. _________________________________________________________ NON-VASCULAR Lower chest: Limited visualization of the lower thorax demonstrates minimal subsegmental atelectasis within the left costophrenic angle.  No focal airspace opacities. No pleural effusion. Cardiomegaly.  No pericardial effusion. Hepatobiliary: Normal hepatic contour. There is a punctate (approximately 0.5 cm) hyperenhancing lesion within the dome of the right lobe of the liver (image 20, series 6), which is too small to adequately characterize though potentially representative of a perfusional  anomaly. Normal appearance of the gallbladder given degree distention. No radiopaque gallstones. No intra or extrahepatic biliary duct dilatation. No ascites. Pancreas: Normal appearance of the pancreas. Spleen: Normal appearance of the spleen. Adrenals/Urinary Tract: Excreted contrast is seen within bilateral renal collecting systems as a sequela contrast-enhanced abdominal CT performed 02/12/2020. Given persistence of contrast, renal dysfunction is a consideration. Otherwise, there is symmetric enhancement and excretion of the bilateral kidneys. Redemonstrated bilateral renal cysts, the largest of which within the right kidney measuring 3.1 cm (image 11, series 13) and left-sided renal cyst measuring 3.0 cm (image 13, series 3). Redemonstrated multiple bilateral parapelvic cysts, left greater than right. No urinary obstruction. There is a very minimal amount of likely age and body habitus related perinephric stranding. Normal appearance of the bilateral adrenal glands. Normal appearance of the urinary bladder given degree of distention. Stomach/Bowel: Progressive, now large colonic stool burden. Sequela previous right hemicolectomy with patulous distension of the perianastomotic small bowel secondary to colonic stool burden. No definite evidence enteric obstruction. Scattered colonic diverticulosis without evidence superimposed acute diverticulitis. No pneumoperitoneum, pneumatosis or portal venous gas. Lymphatic: No bulky retroperitoneal, mesenteric, pelvic or inguinal lymphadenopathy. Reproductive: Brachytherapy seeds are seen in the prostate gland. Other: Minimal amount of subcutaneous edema about the midline of the low back. Musculoskeletal: Post right total hip replacement without definite evidence of hardware failure loosening. Severe degenerative change the left hip with joint space loss, subchondral sclerosis and osteophytosis. Mild to moderate multilevel lumbar spine DDD, worse at L4-L5 with disc space  height loss, endplate irregularity and sclerosis. IMPRESSION: VASCULAR 1. Post endovascular repair infrarenal abdominal aortic aneurysm with findings suggestive of a type 2 endoleak likely supplied via a combination of the median sacral artery, the L4 lumbar artery and the IMA without change in caliber of the native abdominal aortic aneurysm sac. 2. Increased attenuation of the native abdominal aortic aneurysm sac on the precontrast images is favored to represent either evolving hematoma versus residual contrast from recent CT scan performed 02/12/2020 (favored). No perivascular stranding. 3. 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 4. Aortic aneurysm NOS (ICD10-I71.9). Aortic Atherosclerosis (ICD10-I70.0). NON-VASCULAR 1. Worsening colonic stool burden without definitive evidence of enteric obstruction. Findings most suggestive of constipation. Otherwise, no explanation for patient's persistent postop abdominal pain. 2. Sequela of previous right hemicolectomy, now with patulous distension of the peri-anastomotic distal small bowel, again favored to be secondary to constipation. 3. Scattered colonic diverticulosis without evidence superimposed acute diverticulitis. 4. Residual contrast within the bilateral renal collecting systems from abdominal CT performed 02/12/2020 could be seen in the setting of renal insufficiency. Clinical correlation is advised. Electronically Signed   By: Sandi Mariscal M.D.   On: 02/16/2020 15:19    Procedures Procedures (including critical care time)  Medications Ordered in ED Medications  HYDROmorphone (DILAUDID) injection 0.5 mg (0.5 mg Intravenous Given 02/16/20 1442)  iohexol (OMNIPAQUE) 350 MG/ML injection 100 mL (100 mLs Intravenous Contrast Given 02/16/20 1434)    ED Course  I have reviewed the triage vital signs and the nursing notes.  Pertinent labs & imaging  results that were available during my care  of the patient were reviewed by me and considered in my medical decision making (see chart for details).    MDM Rules/Calculators/A&P                          Patient presenting for evaluation of persistent and progressively worsening lower abdominal pain. He is a few weeks status post endovascularly placed AAA stent and has been followed by vascular surgery outpatient and has been seen in the ED for similar symptoms. He also notes constipation and has not had a bowel movement in 5 days. Also notes decreased flatus. His abdomen is soft but protuberant, no rebound or guarding noted. Maximally tender to palpation along the right lower quadrant. Also of note he is status post right partial colectomy due to cancer involving the hepatic flexure. Will obtain lab work, UA, and CTA of the abdomen and pelvis to rule out postoperative complication and evaluate for potential bowel obstruction.  Lab work reviewed and interpreted by myself shows no leukocytosis, stable anemia, no renal insufficiency or metabolic derangements.  Imaging today shows persistent type II endoleak, no change in aneurysm size, as well as increased attenuation of the native abdominal aortic aneurysm sac on the precontrast images favored to represent either evolving hematoma versus residual contrast from recent CT scan. Imaging also concerning for constipation.  CONSULT: Spoke with Dr. Scot Dock with on-call vascular surgery service who independently reviewed the patient's images and is reassured that the type II endoleak does not require any intervention as the aneurysm is stable in size. He also feels that the increased attenuation in the native abdominal aortic aneurysm is likely residual contrast from the recent CT scan.   On reevaluation the patient is resting comfortably in no distress. We discussed work-up and findings. I suspect that his symptoms are likely in the setting of his constipation  which is secondary to his opiate use. He is currently taking oxycodone 10 mg tablets prescribed by his PCP. We discussed the importance of taking this medication only as needed and that constipation is a common side effect of narcotics. He is already taking stool softeners and I encouraged him to continue taking these in the meantime. We also discussed good bowel regimen with MiraLAX, increased fluid intake and increased activity.  He has an appointment to see his vascular surgeon Dr. [name] in 2 days and will discuss his pain medication regimen with him at that time. At this time no evidence of acute surgical abdominal pathology and repeat abdominal examination remains benign. Wife is now at the bedside and I discussed work-up and findings and answered all questions for both patient and wife. Plan for likely discharge after his UA results. Patient and wife are verbalized understanding of and agreement with plan and patient is hemodynamically stable for discharge pending results of UA.  4:30PM Signed out care to oncoming provider PA Ileene Patrick to follow-up on results of UA. If concerning for UTI, will prescribe antibiotics.    Final Clinical Impression(s) / ED Diagnoses Final diagnoses:  Lower abdominal pain  Drug-induced constipation    Rx / DC Orders ED Discharge Orders         Ordered    polyethylene glycol (MIRALAX) 17 g packet  Daily     Discontinue  Reprint     02/16/20 1616           Renita Papa, PA-C 02/16/20 1654    Tegeler, Gwenyth Allegra, MD 02/17/20 1426

## 2020-02-17 LAB — URINE CULTURE: Culture: NO GROWTH

## 2020-02-18 ENCOUNTER — Encounter: Payer: Self-pay | Admitting: Vascular Surgery

## 2020-02-18 ENCOUNTER — Ambulatory Visit (INDEPENDENT_AMBULATORY_CARE_PROVIDER_SITE_OTHER): Payer: Self-pay | Admitting: Vascular Surgery

## 2020-02-18 ENCOUNTER — Other Ambulatory Visit: Payer: Self-pay

## 2020-02-18 VITALS — BP 186/96 | HR 70 | Temp 97.7°F | Resp 18 | Ht 67.5 in | Wt 158.5 lb

## 2020-02-18 DIAGNOSIS — I714 Abdominal aortic aneurysm, without rupture, unspecified: Secondary | ICD-10-CM

## 2020-02-18 NOTE — Progress Notes (Addendum)
Patient name: Aaron Hawkins MRN: [id number] DOB: [birth year] Sex: male  REASON FOR VISIT: Continued follow-up after stent graft repair abdominal aortic aneurysm on 01/22/2020.  HPI: Aaron Hawkins is a 79 y.o. male here today for further follow-up of his stent graft repair.  He is here with his wife.  He has had a very unusual postoperative course.  He had uneventful stent graft repair on 01/22/2020.  He was discharged home on postoperative day 1.  He did very well for 2 weeks.  He reports that he was going for walks in the neighborhood and had returned to his normal activities.  On the evening of 02/04/2020 he reported having lower abdominal and back pain and the feeling of chills.  Reports that he could not get warm for 3 hours and was wrapped in blankets.  He notified our office and was scheduled for CT scan and had an office visit with Dr. Oneida Hawkins on 02/06/2020.  CT scan was unremarkable and showed expected changes after stent graft repair.  There was some question of a potential urinary tract infection and he had had a Foley catheter for 24 hours at the time of his stent graft repair.  Does have a history of prostate resection.  Was placed on antibiotics for presumed urinary tract infection.  He continued to not improve.  Had progressive abdominal pain and was seen in the [facility] emergency department on 02/12/2020.  Had normal laboratory findings that day as he had before.  Specifically no elevated white blood cell count.  He underwent yet another CT scan and this also was unremarkable.  He was giving narcotic pain meds and reports that this did help some but had recurrent pain and presented back to [facility] emergency room on 02/16/2020 where again he underwent a CT scan.  At that point CT scan showed that he did have significant stool burden with constipation.  He was encouraged to take MiraLAX and has had some bowel movements today, 2 days later and reports some  improvement but does continue to have pain.  Today he does not appear toxic.  He is certainly uncomfortable.  Of note he had had a colon resection prior to his aneurysm repair.  His colon cancer was found incidentally during his evaluation for stent graft repair.  His stent graft was deferred until after recovery.  He underwent robot-assisted a sending right colectomy with Dr. Leighton Hawkins on 3/32/9518.  Current Outpatient Medications  Medication Sig Dispense Refill  . acetaminophen (TYLENOL) 500 MG tablet Take 500 mg by mouth every 6 (six) hours as needed for moderate pain.     . polyethylene glycol (MIRALAX) 17 g packet Take 17 g by mouth daily. 14 each 0  . Polyvinyl Alcohol-Povidone (REFRESH OP) Place 2 drops into both eyes daily as needed (dry/irritated eyes).     Marland Kitchen aspirin EC 81 MG EC tablet Take 1 tablet (81 mg total) by mouth daily at 6 (six) AM. Swallow whole. (Patient not taking: Reported on 02/18/2020)    . Multiple Vitamins-Minerals (PRESERVISION AREDS 2) CAPS Take 1 capsule by mouth daily.  (Patient not taking: Reported on 02/18/2020)    . oxyCODONE-acetaminophen (PERCOCET) 5-325 MG tablet Take 1 tablet by mouth every 6 (six) hours as needed. (Patient not taking: Reported on 02/18/2020) 8 tablet 0  . rosuvastatin (CRESTOR) 10 MG tablet Take 1 tablet (10 mg total) by mouth daily. (Patient not taking: Reported on 02/18/2020) 30 tablet 6   No current facility-administered  medications for this visit.     PHYSICAL EXAM: Vitals:   02/18/20 1441  BP: (!) 186/96  Pulse: 70  Resp: 18  Temp: 97.7 F (36.5 C)  TempSrc: Temporal  SpO2: 94%  Weight: 158 lb 8 oz (71.9 kg)  Height: 5' 7.5" (1.715 m)    GENERAL: The patient is a well-nourished male, in no acute distress. The vital signs are documented above. His abdomen is soft.  He does not have any shake tenderness.  No tenderness to mild to moderate pressure.  2+ femoral pulses with no evidence of false aneurysm  I reviewed his CT scans  from 7/22, 7/28 and 8/01.  I also reviewed his preoperative CT scans.  He has expected positioning of his stent graft with small endoleak which would not cause pain.  This arises from lumbar vertebral vessels low near the aortic bifurcation.  He did have a patent IMA prior to his procedure.  This was small in diameter at its origin.  On his postoperative CT scans, he does have a very Aaron Hawkins reconstitution of his IMA and does have the same configuration of flow into his pelvis.  MEDICAL ISSUES: Very confusing picture.  Had 2 weeks of normal recovery and now is having abdominal and back pain.  Unclear as to the etiology of this.  Doubt that he is having colon ischemia although this is possible.  He has had a completely normal white blood cell counts with all of his ER presentations.  Does not have any evidence of colon thickening or other concerning findings on CT scan.  Did have constipation with a large stool burden on his last CT scan.  He has completely normal celiac and submit superior mesenteric flow and normal pelvic flow.  Did not have coverage of his internal iliac arteries.  Has very Aaron Hawkins reconstitution via collaterals of his inferior mesenteric artery.  I have a telephone call into Dr. Cristina Hawkins who is his gastroenterologist to discuss his case and decide next steps.  We will communicate with him following this discussion   Aaron Posner, MD FACS [facility] [phone]  This is an addendum to my office visit.  I spoke with Dr. Cristina Hawkins last night regarding the patient.  He feels like GI evaluation and probable endoscopy is appropriate.  I discussed this yesterday evening with the patient.  We attempted direct admit to the hospital to expedite this work-up and unfortunately the hospital is full with multiple beds holding in the ER and other outpatients to be admitted.  I discussed this this morning by telephone with the patient and explained that his  option would be presentation to the emergency department with admission versus continued outpatient evaluation.  He reports that he does feel somewhat better this morning after additional bowel movement.  Requests outpatient evaluation.  I discussed this with Dr. Cristina Hawkins by telephone who will arrange outpatient GI visit and probable endoscopy. Pager [phone]

## 2020-02-19 ENCOUNTER — Other Ambulatory Visit: Payer: Medicare Other

## 2020-02-19 DIAGNOSIS — I499 Cardiac arrhythmia, unspecified: Secondary | ICD-10-CM | POA: Diagnosis not present

## 2020-02-19 DIAGNOSIS — R634 Abnormal weight loss: Secondary | ICD-10-CM | POA: Diagnosis not present

## 2020-02-19 DIAGNOSIS — R1030 Lower abdominal pain, unspecified: Secondary | ICD-10-CM | POA: Diagnosis not present

## 2020-02-19 DIAGNOSIS — R630 Anorexia: Secondary | ICD-10-CM | POA: Diagnosis not present

## 2020-02-20 DIAGNOSIS — B0229 Other postherpetic nervous system involvement: Secondary | ICD-10-CM | POA: Diagnosis not present

## 2020-02-20 DIAGNOSIS — C183 Malignant neoplasm of hepatic flexure: Secondary | ICD-10-CM | POA: Diagnosis not present

## 2020-02-20 DIAGNOSIS — I499 Cardiac arrhythmia, unspecified: Secondary | ICD-10-CM | POA: Diagnosis not present

## 2020-02-25 ENCOUNTER — Encounter: Payer: Medicare Other | Admitting: Vascular Surgery

## 2020-02-25 DIAGNOSIS — R634 Abnormal weight loss: Secondary | ICD-10-CM | POA: Diagnosis not present

## 2020-02-25 DIAGNOSIS — R1030 Lower abdominal pain, unspecified: Secondary | ICD-10-CM | POA: Diagnosis not present

## 2020-02-25 DIAGNOSIS — K222 Esophageal obstruction: Secondary | ICD-10-CM | POA: Diagnosis not present

## 2020-02-25 DIAGNOSIS — K2930 Chronic superficial gastritis without bleeding: Secondary | ICD-10-CM | POA: Diagnosis not present

## 2020-02-25 DIAGNOSIS — K228 Other specified diseases of esophagus: Secondary | ICD-10-CM | POA: Diagnosis not present

## 2020-02-26 DIAGNOSIS — Z8546 Personal history of malignant neoplasm of prostate: Secondary | ICD-10-CM | POA: Diagnosis not present

## 2020-02-26 DIAGNOSIS — Z85038 Personal history of other malignant neoplasm of large intestine: Secondary | ICD-10-CM | POA: Diagnosis not present

## 2020-02-26 DIAGNOSIS — R739 Hyperglycemia, unspecified: Secondary | ICD-10-CM | POA: Diagnosis not present

## 2020-02-26 DIAGNOSIS — D509 Iron deficiency anemia, unspecified: Secondary | ICD-10-CM | POA: Diagnosis not present

## 2020-02-26 DIAGNOSIS — Z9889 Other specified postprocedural states: Secondary | ICD-10-CM | POA: Diagnosis not present

## 2020-02-26 DIAGNOSIS — R1030 Lower abdominal pain, unspecified: Secondary | ICD-10-CM | POA: Diagnosis not present

## 2020-03-02 DIAGNOSIS — K2930 Chronic superficial gastritis without bleeding: Secondary | ICD-10-CM | POA: Diagnosis not present

## 2020-03-02 DIAGNOSIS — K228 Other specified diseases of esophagus: Secondary | ICD-10-CM | POA: Diagnosis not present

## 2020-03-03 DIAGNOSIS — R102 Pelvic and perineal pain: Secondary | ICD-10-CM | POA: Diagnosis not present

## 2020-03-05 DIAGNOSIS — R102 Pelvic and perineal pain: Secondary | ICD-10-CM | POA: Diagnosis not present

## 2020-03-05 DIAGNOSIS — Z8546 Personal history of malignant neoplasm of prostate: Secondary | ICD-10-CM | POA: Diagnosis not present

## 2020-03-05 DIAGNOSIS — N5082 Scrotal pain: Secondary | ICD-10-CM | POA: Diagnosis not present

## 2020-03-13 DIAGNOSIS — R102 Pelvic and perineal pain: Secondary | ICD-10-CM | POA: Diagnosis not present

## 2020-03-13 DIAGNOSIS — M6281 Muscle weakness (generalized): Secondary | ICD-10-CM | POA: Diagnosis not present

## 2020-03-13 DIAGNOSIS — R262 Difficulty in walking, not elsewhere classified: Secondary | ICD-10-CM | POA: Diagnosis not present

## 2020-03-13 DIAGNOSIS — M62838 Other muscle spasm: Secondary | ICD-10-CM | POA: Diagnosis not present

## 2020-03-13 DIAGNOSIS — R350 Frequency of micturition: Secondary | ICD-10-CM | POA: Diagnosis not present

## 2020-03-16 DIAGNOSIS — R102 Pelvic and perineal pain: Secondary | ICD-10-CM | POA: Diagnosis not present

## 2020-03-16 DIAGNOSIS — Z0000 Encounter for general adult medical examination without abnormal findings: Secondary | ICD-10-CM | POA: Diagnosis not present

## 2020-03-16 DIAGNOSIS — N3281 Overactive bladder: Secondary | ICD-10-CM | POA: Diagnosis not present

## 2020-03-16 DIAGNOSIS — R3915 Urgency of urination: Secondary | ICD-10-CM | POA: Diagnosis not present

## 2020-03-16 DIAGNOSIS — N5082 Scrotal pain: Secondary | ICD-10-CM | POA: Diagnosis not present

## 2020-03-16 DIAGNOSIS — M62838 Other muscle spasm: Secondary | ICD-10-CM | POA: Diagnosis not present

## 2020-03-16 DIAGNOSIS — R350 Frequency of micturition: Secondary | ICD-10-CM | POA: Diagnosis not present

## 2020-03-16 DIAGNOSIS — C61 Malignant neoplasm of prostate: Secondary | ICD-10-CM | POA: Diagnosis not present

## 2020-03-16 DIAGNOSIS — R262 Difficulty in walking, not elsewhere classified: Secondary | ICD-10-CM | POA: Diagnosis not present

## 2020-03-16 DIAGNOSIS — M6281 Muscle weakness (generalized): Secondary | ICD-10-CM | POA: Diagnosis not present

## 2020-03-18 DIAGNOSIS — D509 Iron deficiency anemia, unspecified: Secondary | ICD-10-CM | POA: Diagnosis not present

## 2020-03-18 DIAGNOSIS — R296 Repeated falls: Secondary | ICD-10-CM | POA: Diagnosis not present

## 2020-03-18 DIAGNOSIS — R531 Weakness: Secondary | ICD-10-CM | POA: Diagnosis not present

## 2020-03-19 ENCOUNTER — Other Ambulatory Visit: Payer: Self-pay

## 2020-03-19 ENCOUNTER — Ambulatory Visit (INDEPENDENT_AMBULATORY_CARE_PROVIDER_SITE_OTHER): Payer: Self-pay | Admitting: Vascular Surgery

## 2020-03-19 ENCOUNTER — Encounter: Payer: Self-pay | Admitting: Vascular Surgery

## 2020-03-19 ENCOUNTER — Other Ambulatory Visit: Payer: Self-pay | Admitting: *Deleted

## 2020-03-19 VITALS — BP 116/69 | HR 70 | Temp 98.5°F | Resp 20 | Ht 67.5 in | Wt 151.0 lb

## 2020-03-19 DIAGNOSIS — T827XXA Infection and inflammatory reaction due to other cardiac and vascular devices, implants and grafts, initial encounter: Secondary | ICD-10-CM

## 2020-03-19 NOTE — Progress Notes (Signed)
Patient is an 79 year old male who returns for follow-up today.  He underwent aneurysm stent graft repair by Dr. [name] July 2021.  Since placement of his stent graft he felt well initially but about 2 weeks after his procedure he developed a feeling of cold sensation where he could not be kept warm except with a blanket.  Since that time he has had failure to thrive with decreased appetite and increasing weakness to the point where he is now in a wheelchair.  He recently had an upper endoscopy and lower endoscopy which were fairly unremarkable.  He was recently seen by urology and thought to have some pelvic floor instability but certainly nothing to explain all of his weakness symptoms.  He was treated for a urinary tract infection antibiotics and no real improvement.  Recent CBC and comprehensive metabolic panel's were fairly unremarkable other than a low albumin.  It was 2.5.  He had a CT angiogram of the abdomen and pelvis on August 1 which was fairly unremarkable with the exception of a type II endoleak.  He does not really describe fever or chills but states that he feels cold frequently.  He has lost almost 20 pounds since his operation.  Physical exam:  Vitals:   03/19/20 1510  BP: 116/69  Pulse: 70  Resp: 20  Temp: 98.5 F (36.9 C)  SpO2: 95%  Weight: 151 lb (68.5 kg)  Height: 5' 7.5" (1.715 m)    Abdomen: Soft mild tenderness over the area of his abdominal aneurysm which is nonpulsatile extremities: 2+ femoral pulses bilaterally  Assessment: Patient with continued failure to thrive now almost 2 months out after his endovascular stent graft repair with essentially negative work-up to date.  I have some concerns that he may have an indolent graft infection.  Plan: We will schedule the patient to have a sed rate and C-reactive protein blood sample drawn.    I have also scheduled him for a tagged white cell scan of his stent graft.  He will follow up with me after the above work-up is  complete.  Ruta Hinds, MD [facility] [place] Office: [phone]

## 2020-03-20 ENCOUNTER — Inpatient Hospital Stay: Payer: Medicare Other | Attending: Nurse Practitioner

## 2020-03-20 ENCOUNTER — Other Ambulatory Visit: Payer: Self-pay

## 2020-03-20 DIAGNOSIS — Z85038 Personal history of other malignant neoplasm of large intestine: Secondary | ICD-10-CM | POA: Diagnosis not present

## 2020-03-20 DIAGNOSIS — D649 Anemia, unspecified: Secondary | ICD-10-CM | POA: Diagnosis not present

## 2020-03-20 DIAGNOSIS — C183 Malignant neoplasm of hepatic flexure: Secondary | ICD-10-CM

## 2020-03-20 LAB — CBC WITH DIFFERENTIAL (CANCER CENTER ONLY)
Abs Immature Granulocytes: 0.02 10*3/uL (ref 0.00–0.07)
Basophils Absolute: 0 10*3/uL (ref 0.0–0.1)
Basophils Relative: 1 %
Eosinophils Absolute: 0.1 10*3/uL (ref 0.0–0.5)
Eosinophils Relative: 1 %
HCT: 37.9 % — ABNORMAL LOW (ref 39.0–52.0)
Hemoglobin: 11.3 g/dL — ABNORMAL LOW (ref 13.0–17.0)
Immature Granulocytes: 0 %
Lymphocytes Relative: 16 %
Lymphs Abs: 1 10*3/uL (ref 0.7–4.0)
MCH: 23.4 pg — ABNORMAL LOW (ref 26.0–34.0)
MCHC: 29.8 g/dL — ABNORMAL LOW (ref 30.0–36.0)
MCV: 78.6 fL — ABNORMAL LOW (ref 80.0–100.0)
Monocytes Absolute: 0.5 10*3/uL (ref 0.1–1.0)
Monocytes Relative: 9 %
Neutro Abs: 4.4 10*3/uL (ref 1.7–7.7)
Neutrophils Relative %: 73 %
Platelet Count: 381 10*3/uL (ref 150–400)
RBC: 4.82 MIL/uL (ref 4.22–5.81)
RDW: 21.2 % — ABNORMAL HIGH (ref 11.5–15.5)
WBC Count: 6.1 10*3/uL (ref 4.0–10.5)
nRBC: 0 % (ref 0.0–0.2)

## 2020-03-20 LAB — IRON AND TIBC
Iron: 13 ug/dL — ABNORMAL LOW (ref 42–163)
Saturation Ratios: 7 % — ABNORMAL LOW (ref 20–55)
TIBC: 190 ug/dL — ABNORMAL LOW (ref 202–409)
UIBC: 177 ug/dL (ref 117–376)

## 2020-03-20 LAB — FERRITIN: Ferritin: 204 ng/mL (ref 24–336)

## 2020-03-20 LAB — CMP (CANCER CENTER ONLY)
ALT: 6 U/L (ref 0–44)
AST: 9 U/L — ABNORMAL LOW (ref 15–41)
Albumin: 2.5 g/dL — ABNORMAL LOW (ref 3.5–5.0)
Alkaline Phosphatase: 74 U/L (ref 38–126)
Anion gap: 8 (ref 5–15)
BUN: 21 mg/dL (ref 8–23)
CO2: 32 mmol/L (ref 22–32)
Calcium: 9 mg/dL (ref 8.9–10.3)
Chloride: 102 mmol/L (ref 98–111)
Creatinine: 0.81 mg/dL (ref 0.61–1.24)
GFR, Est AFR Am: >60 mL/min (ref >60)
GFR, Estimated: >60 mL/min (ref >60)
Glucose, Bld: 125 mg/dL — ABNORMAL HIGH (ref 70–99)
Potassium: 4.2 mmol/L (ref 3.5–5.1)
Sodium: 142 mmol/L (ref 135–145)
Total Bilirubin: 0.3 mg/dL (ref 0.3–1.2)
Total Protein: 7.1 g/dL (ref 6.5–8.1)

## 2020-03-20 LAB — CEA (IN HOUSE-CHCC): CEA (CHCC-In House): <1 ng/mL (ref 0.00–5.00)

## 2020-03-24 ENCOUNTER — Telehealth: Payer: Self-pay

## 2020-03-24 ENCOUNTER — Ambulatory Visit (HOSPITAL_COMMUNITY)
Admission: RE | Admit: 2020-03-24 | Discharge: 2020-03-24 | Disposition: A | Discharge status: Home / Self Care | Payer: Medicare Other | Source: Ambulatory Visit | Attending: Vascular Surgery | Admitting: Vascular Surgery

## 2020-03-24 ENCOUNTER — Other Ambulatory Visit: Payer: Self-pay

## 2020-03-24 DIAGNOSIS — I714 Abdominal aortic aneurysm, without rupture: Secondary | ICD-10-CM | POA: Diagnosis not present

## 2020-03-24 DIAGNOSIS — X58XXXA Exposure to other specified factors, initial encounter: Secondary | ICD-10-CM | POA: Diagnosis not present

## 2020-03-24 DIAGNOSIS — T827XXA Infection and inflammatory reaction due to other cardiac and vascular devices, implants and grafts, initial encounter: Secondary | ICD-10-CM | POA: Insufficient documentation

## 2020-03-24 MED ORDER — TECHNETIUM TC 99M EXAMETAZIME IV KIT
10.5000 | PACK | Freq: Once | INTRAVENOUS | Status: AC | PRN
  Administered 2020-03-24: 10.5 via INTRAVENOUS

## 2020-03-24 NOTE — Telephone Encounter (Signed)
Spoke with pt made aware of lab results most recent CEA/iron/protein/albumin  also made pt aware of recommendations for high protein diet, to take multivitamin and oral iron supplements.  Pt states he will follow up with cardiac appt in approx 1 wk and understands to call for any changes questions and/or concerns    Please let pt know his lab results, iron level much better, continue oral iron. Tumor marker normal, no other concerns. Thanks   Truitt Merle  03/21/2020

## 2020-03-24 NOTE — Telephone Encounter (Signed)
-----   Message from Alla Feeling, NP sent at 03/23/2020  8:58 PM EDT ----- Please let him know labs. Tumor marker CEA is normal. His anemia has improved since 12/2019, ferritin is normal but serum iron low. Protein/albumin is low encourage high protein diet. He can take multivitamin with Iron once daily.   Appears he is undergoing cardiac work up related to his aneurysm, please have him call us if he is having issues from previous colon cancer/surgery and also make sure he has seen Dr. Marcello Moores.   Otherwise we see him back in 06/2020.  Thanks, Regan Rakers

## 2020-03-26 ENCOUNTER — Other Ambulatory Visit: Payer: Self-pay

## 2020-03-26 ENCOUNTER — Ambulatory Visit (INDEPENDENT_AMBULATORY_CARE_PROVIDER_SITE_OTHER): Payer: Medicare Other | Admitting: Cardiovascular Disease

## 2020-03-26 ENCOUNTER — Encounter: Payer: Self-pay | Admitting: Cardiovascular Disease

## 2020-03-26 VITALS — BP 112/62 | HR 52 | Ht 67.5 in | Wt 148.8 lb

## 2020-03-26 DIAGNOSIS — I2584 Coronary atherosclerosis due to calcified coronary lesion: Secondary | ICD-10-CM

## 2020-03-26 DIAGNOSIS — E7800 Pure hypercholesterolemia, unspecified: Secondary | ICD-10-CM | POA: Diagnosis not present

## 2020-03-26 DIAGNOSIS — R55 Syncope and collapse: Secondary | ICD-10-CM | POA: Diagnosis not present

## 2020-03-26 DIAGNOSIS — R0602 Shortness of breath: Secondary | ICD-10-CM | POA: Diagnosis not present

## 2020-03-26 DIAGNOSIS — I714 Abdominal aortic aneurysm, without rupture, unspecified: Secondary | ICD-10-CM

## 2020-03-26 DIAGNOSIS — I2510 Atherosclerotic heart disease of native coronary artery without angina pectoris: Secondary | ICD-10-CM

## 2020-03-26 NOTE — Patient Instructions (Signed)
Medication Instructions:  RESTART YOUR ROSUVASTATIN 10 MG DAILY   *If you need a refill on your cardiac medications before your next appointment, please call your pharmacy*  Lab Work: LABS TODAY   Testing/Procedures: Your physician has requested that you have an echocardiogram. Echocardiography is a painless test that uses sound waves to create images of your heart. It provides your doctor with information about the size and shape of your heart and how well your heart's chambers and valves are working. This procedure takes approximately one hour. There are no restrictions for this procedure. [facility] [address]   Follow-Up: AS NEEDED   Other Instructions  Echocardiogram An echocardiogram is a procedure that uses painless sound waves (ultrasound) to produce an image of the heart. Images from an echocardiogram can provide important information about:  Signs of coronary artery disease (CAD).  Aneurysm detection. An aneurysm is a weak or damaged part of an artery wall that bulges out from the normal force of blood pumping through the body.  Heart size and shape. Changes in the size or shape of the heart can be associated with certain conditions, including heart failure, aneurysm, and CAD.  Heart muscle function.  Heart valve function.  Signs of a past heart attack.  Fluid buildup around the heart.  Thickening of the heart muscle.  A tumor or infectious growth around the heart valves. Tell a health care provider about:  Any allergies you have.  All medicines you are taking, including vitamins, herbs, eye drops, creams, and over-the-counter medicines.  Any blood disorders you have.  Any surgeries you have had.  Any medical conditions you have.  Whether you are pregnant or may be pregnant. What are the risks? Generally, this is a safe procedure. However, problems may occur, including:  Allergic reaction to dye (contrast) that may be used during the  procedure. What happens before the procedure? No specific preparation is needed. You may eat and drink normally. What happens during the procedure?   An IV tube may be inserted into one of your veins.  You may receive contrast through this tube. A contrast is an injection that improves the quality of the pictures from your heart.  A gel will be applied to your chest.  A wand-like tool (transducer) will be moved over your chest. The gel will help to transmit the sound waves from the transducer.  The sound waves will harmlessly bounce off of your heart to allow the heart images to be captured in real-time motion. The images will be recorded on a computer. The procedure may vary among health care providers and hospitals. What happens after the procedure?  You may return to your normal, everyday life, including diet, activities, and medicines, unless your health care provider tells you not to do that. Summary  An echocardiogram is a procedure that uses painless sound waves (ultrasound) to produce an image of the heart.  Images from an echocardiogram can provide important information about the size and shape of your heart, heart muscle function, heart valve function, and fluid buildup around your heart.  You do not need to do anything to prepare before this procedure. You may eat and drink normally.  After the echocardiogram is completed, you may return to your normal, everyday life, unless your health care provider tells you not to do that. This information is not intended to replace advice given to you by your health care provider. Make sure you discuss any questions you have  with your health care provider. Document Revised: 10/25/2018 Document Reviewed: 08/06/2016 Elsevier Patient Education  [place].

## 2020-03-26 NOTE — Progress Notes (Signed)
Cardiology Office Note   Date:  03/31/2020   ID:  Aaron Hawkins, DOB May 27, 1941, MRN [id number]  PCP:  Lawerance Cruel, MD  Cardiologist:   Skeet Latch, MD   No chief complaint on file.    History of Present Illness: Aaron Hawkins is a 79 y.o. male with AAA s/p repair here to establish care at the request of Lawerance Cruel, MD.  Aaron Hawkins had a AAA was found  Incidentally and then followed serially.  He underwent aneurysm stent graft repair 01/2020.  There is a type II endoleak and he follows with vascular surgery for this.  After the procedure he developed a feeling of a cold sensation and was unable to be kept warm unless he was under a blanket.  He has suffered with failure to thrive and decreased appetite.  He is also had weakness and is unable to walk.  He has lost nearly 20 pounds since the operation.  He had an upper and lower endoscopy that were unremarkable.  Urology found him to have some pelvic floor instability but nothing to explain his overall weakness.  He was treated for UTI but did not improve.  He had a low albumin but no evidence of any significant anemia (mild anemia of chronic disease) or other metabolic derangements.  He last saw Dr. Oneida Alar on 9/2 and ESR, CRP were ordered but not yet performed.  Aaron Hawkins has struggled with bladder region discomfort.  It has been much better in the last several days.  His bowels are back to baseline and he is eating well again. He has mild shortness of breath at times.  He denies chest pain or pressure.  He has no orthopnea or PND.  He has been unstable and fell twice. He thinks they were mechanical falls but he is unsure.  He denies orthostasis.  He reports occasional short palpitaitons. They are not associated with lightheadedness or dizziness. He saw Dr. Harrington Challenger and had an EKG that showed some frequent PACs.  He was referred to cardiology for further evaluation.    Past Medical History:  Diagnosis Date  . AAA (abdominal  aortic aneurysm) ([place])   . Anemia   . Arthritis    "probably in my knees and right shoulder" (11/22/2017)  . Coronary artery calcification 03/31/2020  . Duodenal ulcer 1964  . Family history of breast cancer   . Family history of prostate cancer   . History of Guillain-Barre syndrome 2003  . History of hiatal hernia    per patient about 20 years ago  . History of hydronephrosis    W/ STRICTURE - not aware of this  . History of melanoma   . History of melanoma excision    BACK-- 04-04-2007 W/ SLN BX RIGHT AXILL, right arm 2017  . History of nephrolithiasis    right kidney  . Microscopic hematuria    history of  . Prostate cancer ([place]) 05/30/13   gleason 3+3=6, volume 58 cc  . Pure hypercholesterolemia 03/31/2020  . Skin cancer 04/04/2007; 2017   "back, right arm"    Past Surgical History:  Procedure Laterality Date  . ABDOMINAL AORTIC ENDOVASCULAR STENT GRAFT N/A 01/22/2020   Procedure: ABDOMINAL AORTIC ENDOVASCULAR STENT GRAFT repair;  Surgeon: [name], MD;  Location: [facility] OR;  Service: Vascular;  Laterality: N/A;  . CATARACT EXTRACTION W/ INTRAOCULAR LENS  IMPLANT, BILATERAL Bilateral   . COLON SURGERY     removed intestine  colon ca  .  HERNIA REPAIR  2595'G   w/mesh, umbilical  . INGUINAL HERNIA REPAIR Bilateral 1990's  . JOINT REPLACEMENT    . MELANOMA EXCISION  04/04/2007; 2017   back W/ SLN BX RIGHT AXILL; right arm  . PROSTATE BIOPSY  01/02/13   benign, one small focus of atypical cells right core  . PROSTATE BIOPSY  05/30/13   Gleason 3+3=6, volume 49 cc  . RADIOACTIVE SEED IMPLANT N/A 08/08/2013   Procedure: RADIOACTIVE SEED IMPLANT;  Surgeon: Molli Hazard, MD;  Location[facility];  Service: Urology;  Laterality: N/A;   65 SEED IMPLANTED NO SEEDS FOUND IN BLADDER  . TONSILLECTOMY AND ADENOIDECTOMY  childhood  . TOTAL HIP ARTHROPLASTY Right 01-06-2009  . TOTAL SHOULDER ARTHROPLASTY Right 11/22/2017  . TOTAL SHOULDER ARTHROPLASTY Right  11/22/2017   Procedure: TOTAL SHOULDER ARTHROPLASTY;  Surgeon: Hiram Gash, MD;  Location: [place];  Service: Orthopedics;  Laterality: Right;  . ULTRASOUND GUIDANCE FOR VASCULAR ACCESS Bilateral 01/22/2020   Procedure: ULTRASOUND GUIDANCE FOR VASCULAR ACCESS, bilateral femoral arteries;  Surgeon: [name], MD;  Location: [facility] OR;  Service: Vascular;  Laterality: Bilateral;     Current Outpatient Medications  Medication Sig Dispense Refill  . acetaminophen (TYLENOL) 500 MG tablet Take 500 mg by mouth every 6 (six) hours as needed for moderate pain.     Marland Kitchen aspirin EC 81 MG EC tablet Take 1 tablet (81 mg total) by mouth daily at 6 (six) AM. Swallow whole.    . diazepam (VALIUM) 10 MG tablet Take 10 mg by mouth as needed for anxiety.    . Multiple Vitamins-Minerals (PRESERVISION AREDS 2) CAPS Take 1 capsule by mouth daily.     . polyethylene glycol (MIRALAX) 17 g packet Take 17 g by mouth daily. 14 each 0  . Polyvinyl Alcohol-Povidone (REFRESH OP) Place 2 drops into both eyes daily as needed (dry/irritated eyes).     . rosuvastatin (CRESTOR) 10 MG tablet Take 1 tablet (10 mg total) by mouth daily. 30 tablet 6   No current facility-administered medications for this visit.    Allergies:   Patient has no known allergies.    Social History:  The patient  reports that he quit smoking about 53 years ago. His smoking use included cigarettes. He has a 7.00 pack-year smoking history. He has never used smokeless tobacco. He reports current alcohol use of about 2.0 standard drinks of alcohol per week. He reports that he does not use drugs.   Family History:  The patient's family history includes Breast cancer (age of onset: 77) in his sister; Cancer in his cousin, maternal uncle, and maternal uncle; Dementia in his mother; Prostate cancer in his brother and father.    ROS:  Please see the history of present illness.   Otherwise, review of systems are positive for none.   All other systems are reviewed  and negative.    PHYSICAL EXAM: VS:  BP 112/62   Pulse (!) 52   Ht 5' 7.5" (1.715 m)   Wt 148 lb 12.8 oz (67.5 kg)   SpO2 98%   BMI 22.96 kg/m  , BMI Body mass index is 22.96 kg/m. GENERAL: Frail appearing. No acute distress HEENT:  Pupils equal round and reactive, fundi not visualized, oral mucosa unremarkable NECK:  No jugular venous distention, waveform within normal limits, carotid upstroke brisk and symmetric, no bruits, no thyromegaly LYMPHATICS:  No cervical adenopathy LUNGS:  Clear to auscultation bilaterally HEART:  RRR.  PMI not  displaced or sustained,S1 and S2 within normal limits, no S3, no S4, no clicks, no rubs, II/VI sytolic murmurs ABD:  Flat, positive bowel sounds normal in frequency in pitch, no bruits, no rebound, no guarding, no midline pulsatile mass, no hepatomegaly, no splenomegaly.  Mild suprapubic tenderness to palpation. EXT:  2 plus pulses throughout, no edema, no cyanosis no clubbing SKIN:  No rashes no nodules NEURO:  Cranial nerves II through XII grossly intact, motor grossly intact throughout PSYCH:  Cognitively intact, oriented to person place and time   EKG:  EKG is not ordered today. The ekg ordered 03/18/20 demonstrates sinus rhythm.  Frequent PACs   Recent Labs: 01/22/2020: Magnesium 1.7 03/20/2020: ALT 6; BUN 21; Creatinine 0.81; Hemoglobin 11.3; Platelet Count 381; Potassium 4.2; Sodium 142    Lipid Panel No results found for: CHOL, TRIG, HDL, CHOLHDL, VLDL, LDLCALC, LDLDIRECT    Wt Readings from Last 3 Encounters:  03/26/20 148 lb 12.8 oz (67.5 kg)  03/19/20 151 lb (68.5 kg)  02/18/20 158 lb 8 oz (71.9 kg)      ASSESSMENT AND PLAN:  # PACs:  # Falls: Aaron Hawkins falls were likely mechanical, but it is unclear.  He was not orthostatic in clinic today.  His EKG showed frequent PACs and he has mild ectopy on exam today.  He also has a slight systolic murmur.  We will get an echo.  Blood counts and electrolytes have been stable.   #  Coronary calcification: # Hyperlipidemia:  Asymptomatic.  Resume rosuvastatin.  LDL goal <70.  Continue aspirin.  # AAA s/p graft repair: There is a small endoleak.  He has regular follow up with Vascular surgery  # failure to thrive: Very slow post surgical recovery.  Will order the ESR and CRP per Dr. Oneida Alar today to prevent  Him from having to get labs tomorrow.  Current medicines are reviewed at length with the patient today.  The patient does not have concerns regarding medicines.  The following changes have been made:  Resume rosuvastatin  Labs/ tests ordered today include: You can ESR/CRP  Orders Placed This Encounter  Procedures  . C-reactive protein  . Sed Rate (ESR)  . EKG 12-Lead  . ECHOCARDIOGRAM COMPLETE     Disposition:   FU with Fredi Hurtado C. Oval Linsey, MD, [facility] as needed     Signed, Cisco Oval Linsey, MD, [facility]  03/31/2020 2:30 PM    [place]

## 2020-03-27 ENCOUNTER — Telehealth: Payer: Self-pay

## 2020-03-27 LAB — SEDIMENTATION RATE: Sed Rate: 41 mm/hr — ABNORMAL HIGH (ref 0–30)

## 2020-03-27 LAB — C-REACTIVE PROTEIN: CRP: 121 mg/L — ABNORMAL HIGH (ref 0–10)

## 2020-03-27 NOTE — Telephone Encounter (Signed)
Pt called for results-per MD, white cell scan is negative; will discuss further at his next appt. Pt verbalized understanding.

## 2020-03-30 DIAGNOSIS — N3281 Overactive bladder: Secondary | ICD-10-CM | POA: Diagnosis not present

## 2020-03-30 DIAGNOSIS — R102 Pelvic and perineal pain: Secondary | ICD-10-CM | POA: Diagnosis not present

## 2020-03-30 DIAGNOSIS — M6281 Muscle weakness (generalized): Secondary | ICD-10-CM | POA: Diagnosis not present

## 2020-03-30 DIAGNOSIS — N5082 Scrotal pain: Secondary | ICD-10-CM | POA: Diagnosis not present

## 2020-03-30 DIAGNOSIS — M62838 Other muscle spasm: Secondary | ICD-10-CM | POA: Diagnosis not present

## 2020-03-30 DIAGNOSIS — R262 Difficulty in walking, not elsewhere classified: Secondary | ICD-10-CM | POA: Diagnosis not present

## 2020-03-30 DIAGNOSIS — R3915 Urgency of urination: Secondary | ICD-10-CM | POA: Diagnosis not present

## 2020-03-31 ENCOUNTER — Encounter: Payer: Self-pay | Admitting: Cardiovascular Disease

## 2020-03-31 DIAGNOSIS — I2510 Atherosclerotic heart disease of native coronary artery without angina pectoris: Secondary | ICD-10-CM

## 2020-03-31 DIAGNOSIS — I2584 Coronary atherosclerosis due to calcified coronary lesion: Secondary | ICD-10-CM

## 2020-03-31 DIAGNOSIS — E7800 Pure hypercholesterolemia, unspecified: Secondary | ICD-10-CM

## 2020-03-31 HISTORY — DX: Coronary atherosclerosis due to calcified coronary lesion: I25.84

## 2020-03-31 HISTORY — DX: Atherosclerotic heart disease of native coronary artery without angina pectoris: I25.10

## 2020-03-31 HISTORY — DX: Pure hypercholesterolemia, unspecified: E78.00

## 2020-04-01 DIAGNOSIS — R531 Weakness: Secondary | ICD-10-CM | POA: Diagnosis not present

## 2020-04-01 DIAGNOSIS — R102 Pelvic and perineal pain: Secondary | ICD-10-CM | POA: Diagnosis not present

## 2020-04-01 DIAGNOSIS — D509 Iron deficiency anemia, unspecified: Secondary | ICD-10-CM | POA: Diagnosis not present

## 2020-04-01 DIAGNOSIS — R351 Nocturia: Secondary | ICD-10-CM | POA: Diagnosis not present

## 2020-04-06 ENCOUNTER — Other Ambulatory Visit (HOSPITAL_COMMUNITY): Payer: Medicare Other

## 2020-04-07 ENCOUNTER — Encounter (HOSPITAL_BASED_OUTPATIENT_CLINIC_OR_DEPARTMENT_OTHER): Payer: Self-pay | Admitting: *Deleted

## 2020-04-07 ENCOUNTER — Other Ambulatory Visit: Payer: Self-pay

## 2020-04-07 ENCOUNTER — Emergency Department (HOSPITAL_BASED_OUTPATIENT_CLINIC_OR_DEPARTMENT_OTHER): Payer: Medicare Other

## 2020-04-07 ENCOUNTER — Emergency Department (HOSPITAL_BASED_OUTPATIENT_CLINIC_OR_DEPARTMENT_OTHER)
Admission: EM | Admit: 2020-04-07 | Discharge: 2020-04-07 | Disposition: A | Discharge status: Home / Self Care | Payer: Medicare Other | Attending: Emergency Medicine | Admitting: Emergency Medicine

## 2020-04-07 DIAGNOSIS — Z8546 Personal history of malignant neoplasm of prostate: Secondary | ICD-10-CM | POA: Insufficient documentation

## 2020-04-07 DIAGNOSIS — R748 Abnormal levels of other serum enzymes: Secondary | ICD-10-CM | POA: Insufficient documentation

## 2020-04-07 DIAGNOSIS — Z87891 Personal history of nicotine dependence: Secondary | ICD-10-CM | POA: Insufficient documentation

## 2020-04-07 DIAGNOSIS — Z85820 Personal history of malignant melanoma of skin: Secondary | ICD-10-CM | POA: Insufficient documentation

## 2020-04-07 DIAGNOSIS — Z96641 Presence of right artificial hip joint: Secondary | ICD-10-CM | POA: Diagnosis not present

## 2020-04-07 DIAGNOSIS — D649 Anemia, unspecified: Secondary | ICD-10-CM | POA: Diagnosis not present

## 2020-04-07 DIAGNOSIS — Z96611 Presence of right artificial shoulder joint: Secondary | ICD-10-CM | POA: Diagnosis not present

## 2020-04-07 DIAGNOSIS — R109 Unspecified abdominal pain: Secondary | ICD-10-CM | POA: Diagnosis not present

## 2020-04-07 DIAGNOSIS — Z7982 Long term (current) use of aspirin: Secondary | ICD-10-CM | POA: Diagnosis not present

## 2020-04-07 LAB — URINALYSIS, ROUTINE W REFLEX MICROSCOPIC
Bilirubin Urine: NEGATIVE
Glucose, UA: NEGATIVE mg/dL
Hgb urine dipstick: NEGATIVE
Ketones, ur: NEGATIVE mg/dL
Leukocytes,Ua: NEGATIVE
Nitrite: NEGATIVE
Protein, ur: NEGATIVE mg/dL
Specific Gravity, Urine: 1.01 (ref 1.005–1.030)
pH: 7 (ref 5.0–8.0)

## 2020-04-07 LAB — CBC WITH DIFFERENTIAL/PLATELET
Abs Immature Granulocytes: 0.01 10*3/uL (ref 0.00–0.07)
Basophils Absolute: 0 10*3/uL (ref 0.0–0.1)
Basophils Relative: 1 %
Eosinophils Absolute: 0.1 10*3/uL (ref 0.0–0.5)
Eosinophils Relative: 2 %
HCT: 36.2 % — ABNORMAL LOW (ref 39.0–52.0)
Hemoglobin: 10.6 g/dL — ABNORMAL LOW (ref 13.0–17.0)
Immature Granulocytes: 0 %
Lymphocytes Relative: 19 %
Lymphs Abs: 1 10*3/uL (ref 0.7–4.0)
MCH: 23 pg — ABNORMAL LOW (ref 26.0–34.0)
MCHC: 29.3 g/dL — ABNORMAL LOW (ref 30.0–36.0)
MCV: 78.5 fL — ABNORMAL LOW (ref 80.0–100.0)
Monocytes Absolute: 0.7 10*3/uL (ref 0.1–1.0)
Monocytes Relative: 13 %
Neutro Abs: 3.5 10*3/uL (ref 1.7–7.7)
Neutrophils Relative %: 65 %
Platelets: 321 10*3/uL (ref 150–400)
RBC: 4.61 MIL/uL (ref 4.22–5.81)
RDW: 18.6 % — ABNORMAL HIGH (ref 11.5–15.5)
WBC: 5.4 10*3/uL (ref 4.0–10.5)
nRBC: 0 % (ref 0.0–0.2)

## 2020-04-07 LAB — COMPREHENSIVE METABOLIC PANEL
ALT: 8 U/L (ref 0–44)
AST: 12 U/L — ABNORMAL LOW (ref 15–41)
Albumin: 2.7 g/dL — ABNORMAL LOW (ref 3.5–5.0)
Alkaline Phosphatase: 54 U/L (ref 38–126)
Anion gap: 9 (ref 5–15)
BUN: 18 mg/dL (ref 8–23)
CO2: 29 mmol/L (ref 22–32)
Calcium: 8.4 mg/dL — ABNORMAL LOW (ref 8.9–10.3)
Chloride: 100 mmol/L (ref 98–111)
Creatinine, Ser: 0.83 mg/dL (ref 0.61–1.24)
GFR calc Af Amer: >60 mL/min (ref >60)
GFR calc non Af Amer: >60 mL/min (ref >60)
Glucose, Bld: 113 mg/dL — ABNORMAL HIGH (ref 70–99)
Potassium: 4 mmol/L (ref 3.5–5.1)
Sodium: 138 mmol/L (ref 135–145)
Total Bilirubin: 0.5 mg/dL (ref 0.3–1.2)
Total Protein: 6.7 g/dL (ref 6.5–8.1)

## 2020-04-07 LAB — LIPASE, BLOOD: Lipase: 61 U/L — ABNORMAL HIGH (ref 11–51)

## 2020-04-07 MED ORDER — ONDANSETRON HCL 4 MG/2ML IJ SOLN
4.0000 mg | Freq: Once | INTRAMUSCULAR | Status: AC
  Administered 2020-04-07: 4 mg via INTRAVENOUS
  Filled 2020-04-07: qty 2

## 2020-04-07 MED ORDER — IOHEXOL 300 MG/ML  SOLN
100.0000 mL | Freq: Once | INTRAMUSCULAR | Status: AC | PRN
  Administered 2020-04-07: 100 mL via INTRAVENOUS

## 2020-04-07 MED ORDER — SODIUM CHLORIDE 0.9 % IV BOLUS
1000.0000 mL | Freq: Once | INTRAVENOUS | Status: AC
  Administered 2020-04-07: 1000 mL via INTRAVENOUS

## 2020-04-07 MED ORDER — SODIUM CHLORIDE 0.9 % IV SOLN: INTRAVENOUS | Status: DC

## 2020-04-07 NOTE — ED Provider Notes (Signed)
[facility] EMERGENCY DEPARTMENT Provider Note   CSN: [id number] Arrival date & time: 04/07/20  3299     History Chief Complaint  Patient presents with  . Abdominal Pain    Aaron Hawkins is a 79 y.o. male.  HPI   Pt has been having abdominal pain since July 21st.  Prior to that he had colon cancer surrgery as well as surgery for AAA.   He seemed to go through these without difficulty until the sx started on July 21st.  He has had extensive workup including CT scans, GI evaluation, treatment with abx for UTI, evaluation by gen surg, several ED visits, vascular surgery visit.  There was question about possible indolent graft infection and he had a WBC scan.  This was within the last couple of weeks and was negative.  Pt has lost weight since this started.   Pt noticed some swelling on the left side of his abdomen, the shape of banana.  That is a newer issue.  That started maybe about 10 days ago but more noticeable the last several days. He also had a temp to 99.1 which is higher than usual.   Pt has been getting weaker.  He has been using a walker because he has fallen previously.  Before this all started he was able to walk miles at a time.   Family called Dr Buccini's office with the newer sx today and he was directed to the ED for evaluation.  Past Medical History:  Diagnosis Date  . AAA (abdominal aortic aneurysm) ([place])   . Anemia   . Arthritis    "probably in my knees and right shoulder" (11/22/2017)  . Coronary artery calcification 03/31/2020  . Duodenal ulcer 1964  . Family history of breast cancer   . Family history of prostate cancer   . History of Guillain-Barre syndrome 2003  . History of hiatal hernia    per patient about 20 years ago  . History of hydronephrosis    W/ STRICTURE - not aware of this  . History of melanoma   . History of melanoma excision    BACK-- 04-04-2007 W/ SLN BX RIGHT AXILL, right arm 2017  . History of nephrolithiasis    right  kidney  . Microscopic hematuria    history of  . Prostate cancer ([place]) 05/30/13   gleason 3+3=6, volume 58 cc  . Pure hypercholesterolemia 03/31/2020  . Skin cancer 04/04/2007; 2017   "back, right arm"    Patient Active Problem List   Diagnosis Date Noted  . Coronary artery calcification 03/31/2020  . Pure hypercholesterolemia 03/31/2020  . Genetic testing 01/24/2020  . AAA (abdominal aortic aneurysm) ([place]) 01/22/2020  . History of melanoma   . Family history of prostate cancer   . Family history of breast cancer   . Iron deficiency anemia 12/20/2019  . AAA (abdominal aortic aneurysm) without rupture ([place]) 12/06/2019  . History of nephrolithiasis   . History of hiatal hernia   . Arthritis   . 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 Prostate cancer ([place])   . History of Guillain-Barre syndrome 2003  . Duodenal ulcer 1964    Past Surgical History:  Procedure Laterality Date  . ABDOMINAL AORTIC ENDOVASCULAR STENT GRAFT N/A 01/22/2020   Procedure: ABDOMINAL AORTIC ENDOVASCULAR STENT GRAFT repair;  Surgeon: [name], MD;  Location: [facility];  Service: Vascular;  Laterality: N/A;  .  CATARACT EXTRACTION W/ INTRAOCULAR LENS  IMPLANT, BILATERAL Bilateral   . COLON SURGERY     removed intestine  colon ca  . HERNIA REPAIR  8295'A   w/mesh, umbilical  . INGUINAL HERNIA REPAIR Bilateral 1990's  . JOINT REPLACEMENT    . MELANOMA EXCISION  04/04/2007; 2017   back W/ SLN BX RIGHT AXILL; right arm  . PROSTATE BIOPSY  01/02/13   benign, one small focus of atypical cells right core  . PROSTATE BIOPSY  05/30/13   Gleason 3+3=6, volume 49 cc  . RADIOACTIVE SEED IMPLANT N/A 08/08/2013   Procedure: RADIOACTIVE SEED IMPLANT;  Surgeon: Molli Hazard, MD;  Location: [facility];  Service: Urology;  Laterality: N/A;   61 SEED IMPLANTED NO SEEDS FOUND IN BLADDER  . TONSILLECTOMY AND  ADENOIDECTOMY  childhood  . TOTAL HIP ARTHROPLASTY Right 01-06-2009  . TOTAL SHOULDER ARTHROPLASTY Right 11/22/2017  . TOTAL SHOULDER ARTHROPLASTY Right 11/22/2017   Procedure: TOTAL SHOULDER ARTHROPLASTY;  Surgeon: Hiram Gash, MD;  Location: [facility];  Service: Orthopedics;  Laterality: Right;  . ULTRASOUND GUIDANCE FOR VASCULAR ACCESS Bilateral 01/22/2020   Procedure: ULTRASOUND GUIDANCE FOR VASCULAR ACCESS, bilateral femoral arteries;  Surgeon: [name], MD;  Location: [facility];  Service: Vascular;  Laterality: Bilateral;       Family History  Problem Relation Age of Onset  . Prostate cancer Father        dx. in his 100s  . Prostate cancer Brother        dx. in his mid-60s  . Breast cancer Sister 69       dx. in her 97s  . Dementia Mother   . Cancer Maternal Uncle        dx. in his 56s; unknown primary  . Cancer Maternal Uncle        dx. in his 47s; unknown primary  . Cancer Cousin        dx. >50; blood cancer    Social History   Tobacco Use  . Smoking status: Former Smoker    Packs/day: 1.00    Years: 7.00    Pack years: 7.00    Types: Cigarettes    Quit date: 06/18/1966    Years since quitting: 53.8  . Smokeless tobacco: Never Used  Vaping Use  . Vaping Use: Never used  Substance Use Topics  . Alcohol use: Yes    Alcohol/week: 2.0 standard drinks    Types: 2 Glasses of wine per week    Comment: occ  . Drug use: No    Home Medications Prior to Admission medications   Medication Sig Start Date End Date Taking? Authorizing Provider  acetaminophen (TYLENOL) 500 MG tablet Take 500 mg by mouth every 6 (six) hours as needed for moderate pain.     [provider]  aspirin EC 81 MG EC tablet Take 1 tablet (81 mg total) by mouth daily at 6 (six) AM. Swallow whole. 01/24/20   Rhyne, Hulen Shouts, PA-C  diazepam (VALIUM) 10 MG tablet Take 10 mg by mouth as needed for anxiety.    [provider]  Multiple Vitamins-Minerals (PRESERVISION AREDS 2) CAPS Take 1  capsule by mouth daily.     [provider]  polyethylene glycol (MIRALAX) 17 g packet Take 17 g by mouth daily. 02/16/20   Fawze, Mina A, PA-C  Polyvinyl Alcohol-Povidone (REFRESH OP) Place 2 drops into both eyes daily as needed (dry/irritated eyes).     [provider]  rosuvastatin (CRESTOR) 10 MG tablet Take 1 tablet (10 mg total) by mouth daily. 01/23/20   Gabriel Earing, PA-C    Allergies    Patient has no known allergies.  Review of Systems   Review of Systems  All other systems reviewed and are negative.   Physical Exam Updated Vital Signs BP (!) 160/84 (BP Location: Right Arm)   Pulse 61   Temp 97.9 F (36.6 C) (Oral)   Resp 19   Ht 1.715 m (5' 7.5")   Wt 65.3 kg   SpO2 95%   BMI 22.22 kg/m   Physical Exam Vitals and nursing note reviewed.  Constitutional:      Appearance: He is not toxic-appearing or diaphoretic.  HENT:     Head: Normocephalic and atraumatic.     Right Ear: External ear normal.     Left Ear: External ear normal.  Eyes:     General: No scleral icterus.       Right eye: No discharge.        Left eye: No discharge.     Conjunctiva/sclera: Conjunctivae normal.  Neck:     Trachea: No tracheal deviation.  Cardiovascular:     Rate and Rhythm: Normal rate and regular rhythm.  Pulmonary:     Effort: Pulmonary effort is normal. No respiratory distress.     Breath sounds: Normal breath sounds. No stridor. No wheezing or rales.  Abdominal:     General: Bowel sounds are normal. There is no distension.     Palpations: Abdomen is soft.     Tenderness: There is abdominal tenderness. There is no guarding or rebound.     Comments: ttp left side of abdomen, no mass or hernia, ?fatty tissue , loose skin in area where pt feels mass  Musculoskeletal:        General: No tenderness.     Cervical back: Neck supple.  Skin:    General: Skin is warm and dry.     Findings: No rash.  Neurological:     Mental Status: He is alert.     Cranial  Nerves: No cranial nerve deficit (no facial droop, extraocular movements intact, no slurred speech).     Sensory: No sensory deficit.     Motor: No abnormal muscle tone or seizure activity.     Coordination: Coordination normal.     ED Results / Procedures / Treatments   Labs (all labs ordered are listed, but only abnormal results are displayed) Labs Reviewed  COMPREHENSIVE METABOLIC PANEL - Abnormal; Notable for the following components:      Result Value   Glucose, Bld 113 (*)    Calcium 8.4 (*)    Albumin 2.7 (*)    AST 12 (*)    All other components within normal limits  LIPASE, BLOOD - Abnormal; Notable for the following components:   Lipase 61 (*)    All other components within normal limits  CBC WITH DIFFERENTIAL/PLATELET - Abnormal; Notable for the following components:   Hemoglobin 10.6 (*)    HCT 36.2 (*)    MCV 78.5 (*)    MCH 23.0 (*)    MCHC 29.3 (*)    RDW 18.6 (*)    All other components within normal limits  URINALYSIS, ROUTINE W REFLEX MICROSCOPIC    EKG None  Radiology CT ABDOMEN PELVIS W CONTRAST  Result Date: 04/07/2020 CLINICAL DATA:  Abdominal pain and fever.  Possible abdominal mass. EXAM: CT ABDOMEN AND PELVIS WITH CONTRAST TECHNIQUE: Multidetector  CT imaging of the abdomen and pelvis was performed using the standard protocol following bolus administration of intravenous contrast. CONTRAST:  170mL OMNIPAQUE IOHEXOL 300 MG/ML  SOLN COMPARISON:  02/16/2020 FINDINGS: Lower chest: Lung bases are clear. Minimal stable linear scarring/atelectasis left lower lobe. Mild stable cardiomegaly. Hepatobiliary: Liver, gallbladder and biliary tree are normal. Pancreas: Normal. Spleen: Normal. Adrenals/Urinary Tract: Adrenal glands are normal. Kidneys are normal in size with multiple bilateral parapelvic and cortical cysts unchanged. No hydronephrosis or nephrolithiasis. Visualized ureters and bladder are unremarkable. Note that the distal ureters and portions of the  bladder are obscured due to streak artifact from right hip prosthesis. Stomach/Bowel: Stomach and small bowel are normal. Moderate diverticulosis of the colon most prominent over the sigmoid colon. Moderate fecal retention throughout the colon. Surgical suture line over the colon in the right mid abdomen. Findings suggesting prior right colectomy. Coloenteric anastomosis appears within normal. Suture line over a small bowel loop in the right lower quadrant. No dilated small bowel loops. Vascular/Lymphatic: Calcified plaque over the abdominal aorta. Evidence of patient's infrarenal abdominal aortoiliac stent graft which is patent and unchanged. Stable native aneurysmal sac measuring 4.8 cm in AP diameter. 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 Other: No significant free peritoneal fluid or free peritoneal air. Musculoskeletal: Right hip arthroplasty intact. Moderate osteoarthritic change of the left hip. Degenerative change of the spine. IMPRESSION: 1. No acute findings in the abdomen/pelvis. 2. Evidence of patient's infrarenal abdominal aortoiliac stent graft which is patent and unchanged. 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 3. Multiple bilateral parapelvic and cortical cysts unchanged. 4. Colonic diverticulosis without evidence of acute diverticulitis. Moderate fecal retention throughout the colon. 5. Aortic atherosclerosis. Aortic Atherosclerosis (ICD10-I70.0). Electronically Signed   By: Marin Olp M.D.   On: 04/07/2020 12:57    Procedures Procedures (including critical care time)  Medications Ordered in ED Medications  sodium chloride 0.9 % bolus 1,000 mL (0 mLs Intravenous Stopped 04/07/20 1332)    And  0.9 %  sodium  chloride infusion (has no administration in time range)  ondansetron (ZOFRAN) injection 4 mg (4 mg Intravenous Given 04/07/20 1134)  iohexol (OMNIPAQUE) 300 MG/ML solution 100 mL (100 mLs Intravenous Contrast Given 04/07/20 1214)    ED Course  I have reviewed the triage vital signs and the nursing notes.  Pertinent labs & imaging results that were available during my care of the patient were reviewed by me and considered in my medical decision making (see chart for details).  Clinical Course as of Apr 07 1500  Tue Apr 07, 2020  1309 Patient's anemia is increasing from 1 month ago.  MCV remains low   [JK]  1315 Patient's lipase is slightly increased from previous   [JK]  1358 Urinalysis without hematuria or signs of infection   [JK]  1359 IMPRESSION: 1. No acute findings in the abdomen/pelvis. 2. Evidence of patient's infrarenal abdominal aortoiliac stent graft which is patent and unchanged. 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 3. Multiple bilateral parapelvic and cortical cysts unchanged. 4. Colonic diverticulosis without evidence of acute diverticulitis. Moderate fecal retention throughout the colon. 5. Aortic atherosclerosis.     [  JK]  1428 Discussed with Dr Cristina Gong.  Reviewed findings and CT scan.   [JK]    Clinical Course User Index [JK] Dorie Rank, MD   MDM Rules/Calculators/A&P                          Patient presented to ED for evaluation of persistent abdominal pain ongoing for the last couple of months.  Patient's had outpatient evaluations and several CT scans.  Concerned about the possibility obstruction today with the swelling in his abdominal pain but CT scan does not show any acute findings.  Etiology of his pain is still somewhat unclear.  Slight elevation in his lipase will need to be rechecked.  I discussed the findings with Dr. Cristina Gong and  we will start the patient on MiraLAX twice daily considering the findings of constipation.  Patient will follow up in the office with Dr. Janeece Riggers.  Findings and plan were discussed with the patient and his wife. Final Clinical Impression(s) / ED Diagnoses Final diagnoses:  Abdominal pain, unspecified abdominal location  Elevated lipase  Anemia, unspecified type    Rx / DC Orders ED Discharge Orders    None       Dorie Rank, MD 04/07/20 1504

## 2020-04-07 NOTE — ED Notes (Signed)
Pt discharged to home. Discharge instructions have been discussed with patient and/or family members. Pt verbally acknowledges understanding d/c instructions, and endorses comprehension to checkout at registration before leaving.  °

## 2020-04-07 NOTE — ED Notes (Signed)
Patient transported to CT 

## 2020-04-07 NOTE — Discharge Instructions (Signed)
Start taking the miralax once to twice daily for the next week, follow up with Dr Cristina Gong.   The office should call you by Thursday.

## 2020-04-07 NOTE — ED Triage Notes (Signed)
Abdominal pain x 2-3 months. Pt has seen PCP and GI without dx. Pt has had multiple CT scans, colonoscopy, endoscopy and bloodwork.

## 2020-04-09 ENCOUNTER — Telehealth: Payer: Self-pay | Admitting: Hematology

## 2020-04-09 DIAGNOSIS — R262 Difficulty in walking, not elsewhere classified: Secondary | ICD-10-CM | POA: Diagnosis not present

## 2020-04-09 DIAGNOSIS — M6281 Muscle weakness (generalized): Secondary | ICD-10-CM | POA: Diagnosis not present

## 2020-04-09 DIAGNOSIS — R102 Pelvic and perineal pain: Secondary | ICD-10-CM | POA: Diagnosis not present

## 2020-04-09 DIAGNOSIS — R3915 Urgency of urination: Secondary | ICD-10-CM | POA: Diagnosis not present

## 2020-04-09 DIAGNOSIS — N3281 Overactive bladder: Secondary | ICD-10-CM | POA: Diagnosis not present

## 2020-04-09 DIAGNOSIS — M62838 Other muscle spasm: Secondary | ICD-10-CM | POA: Diagnosis not present

## 2020-04-09 DIAGNOSIS — N5082 Scrotal pain: Secondary | ICD-10-CM | POA: Diagnosis not present

## 2020-04-09 NOTE — Telephone Encounter (Signed)
Scheduled appt per 9/22 sch msg - pt is aware of apt added. And reminder letter mailed.

## 2020-04-10 ENCOUNTER — Telehealth: Payer: Self-pay

## 2020-04-10 NOTE — Telephone Encounter (Signed)
I spoke with Mr Trull and left him know per Dr. Lafe Garin he will not need iv iron at this time.  He has an appt 04/13/2020 with his GI MD.  I reviewed his up coming appts with Dr Burr Medico.  He verbalized understanding.

## 2020-04-13 DIAGNOSIS — Z85038 Personal history of other malignant neoplasm of large intestine: Secondary | ICD-10-CM | POA: Diagnosis not present

## 2020-04-13 DIAGNOSIS — R1030 Lower abdominal pain, unspecified: Secondary | ICD-10-CM | POA: Diagnosis not present

## 2020-04-13 DIAGNOSIS — K5901 Slow transit constipation: Secondary | ICD-10-CM | POA: Diagnosis not present

## 2020-04-15 ENCOUNTER — Other Ambulatory Visit: Payer: Self-pay

## 2020-04-15 ENCOUNTER — Encounter: Payer: Self-pay | Admitting: *Deleted

## 2020-04-16 ENCOUNTER — Telehealth: Payer: Self-pay | Admitting: Neurology

## 2020-04-16 ENCOUNTER — Encounter: Payer: Self-pay | Admitting: Neurology

## 2020-04-16 ENCOUNTER — Ambulatory Visit (INDEPENDENT_AMBULATORY_CARE_PROVIDER_SITE_OTHER): Payer: Medicare Other | Admitting: Neurology

## 2020-04-16 ENCOUNTER — Other Ambulatory Visit: Payer: Self-pay

## 2020-04-16 VITALS — BP 175/91 | HR 87 | Ht 67.0 in | Wt 149.0 lb

## 2020-04-16 DIAGNOSIS — M545 Low back pain, unspecified: Secondary | ICD-10-CM

## 2020-04-16 DIAGNOSIS — I2510 Atherosclerotic heart disease of native coronary artery without angina pectoris: Secondary | ICD-10-CM | POA: Diagnosis not present

## 2020-04-16 DIAGNOSIS — G8929 Other chronic pain: Secondary | ICD-10-CM

## 2020-04-16 DIAGNOSIS — I2584 Coronary atherosclerosis due to calcified coronary lesion: Secondary | ICD-10-CM | POA: Diagnosis not present

## 2020-04-16 MED ORDER — GABAPENTIN 300 MG PO CAPS: 300.0000 mg | ORAL_CAPSULE | Freq: Two times a day (BID) | ORAL | 3 refills | Status: DC

## 2020-04-16 MED ORDER — TRAMADOL HCL 50 MG PO TABS: 50.0000 mg | ORAL_TABLET | Freq: Four times a day (QID) | ORAL | 1 refills | Status: DC | PRN

## 2020-04-16 NOTE — Progress Notes (Signed)
Reason for visit: Severe low back and groin pain  Referring physician: Dr. [name] is a 79 y.o. male  History of present illness:  Aaron Hawkins is an 79 year old right-handed white male with a history of prostate cancer is required surgery about 6 years ago.  His PSA levels have been relatively low since that time.  The patient had colon cancer that required surgery in May 2021.  He is followed by Dr. [name].  The patient underwent a surgical grafting of the abdominal aortic aneurysm in July 2021.  He did quite well following the surgery but within a couple weeks postoperatively, he began having severe lower abdominal and groin pain and low back pain.  The patient was seen by Dr. [name], there was some concern for ischemic bowel disease, the patient underwent a colonoscopy through gastroenterology but no problem was noted.  The patient did have a CT of the abdomen and pelvis that did not show clear evidence of the etiology of his pain.  The patient was seen by urology and was not found to have any pelvic floor issues that would cause his pain.  A year and half ago he did have shingles on the left mid thoracic area that is still associated with some postherpetic neuralgia currently.  The patient denies any neck pain or arm discomfort.  He has had some weakness in the legs at times, he has fallen intermittently, he is now using a walker for ambulation.  He denies issues controlling the bowels or the bladder.  He does have some constipation as he is on some iron therapy.  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  The source of the back pain has not been determined, and for this reason he is sent to this office for a neurologic evaluation.  The patient is taking Tylenol for the pain which seems to help, the pain is worse when he is sitting, better when lying down.  Past Medical History:  Diagnosis Date  . AAA (abdominal aortic aneurysm) ([place])   . Anemia   . Arthritis     "probably in my knees and right shoulder" (11/22/2017)  . Coronary artery calcification 03/31/2020  . Duodenal ulcer 1964  . Family history of breast cancer   . Family history of prostate cancer   . Frequent falls   . History of Guillain-Barre syndrome 2003  . History of hiatal hernia    per patient about 20 years ago  . History of hydronephrosis    W/ STRICTURE - not aware of this  . History of melanoma   . History of melanoma excision    BACK-- 04-04-2007 W/ SLN BX RIGHT AXILL, right arm 2017  . History of nephrolithiasis    right kidney  . History of shingles 2020  . Iron deficiency   . Microscopic hematuria    history of  . Post herpetic neuralgia   . Prostate cancer ([place]) 05/30/13   gleason 3+3=6, volume 58 cc  . Pure hypercholesterolemia 03/31/2020  . Skin cancer 04/04/2007; 2017   "back, right arm"    Past Surgical History:  Procedure Laterality Date  . ABDOMINAL AORTIC ENDOVASCULAR STENT GRAFT N/A 01/22/2020   Procedure: ABDOMINAL AORTIC ENDOVASCULAR STENT GRAFT repair;  Surgeon: [name], MD;  Location: [facility] OR;  Service: Vascular;  Laterality: N/A;  . CATARACT EXTRACTION W/ INTRAOCULAR LENS  IMPLANT, BILATERAL Bilateral   . COLON SURGERY     removed intestine  colon ca  . HERNIA REPAIR  1610'R   w/mesh, umbilical  . INGUINAL HERNIA REPAIR Bilateral 1990's  . JOINT REPLACEMENT    . MELANOMA EXCISION  04/04/2007; 2017   back W/ SLN BX RIGHT AXILL; right arm  . PROSTATE BIOPSY  01/02/13   benign, one small focus of atypical cells right core  . PROSTATE BIOPSY  05/30/13   Gleason 3+3=6, volume 49 cc  . RADIOACTIVE SEED IMPLANT N/A 08/08/2013   Procedure: RADIOACTIVE SEED IMPLANT;  Surgeon: Molli Hazard, MD;  Location: [facility];  Service: Urology;  Laterality: N/A;   2 SEED IMPLANTED NO SEEDS FOUND IN BLADDER  . TONSILLECTOMY AND ADENOIDECTOMY  childhood  . TOTAL HIP ARTHROPLASTY Right 01-06-2009  . TOTAL SHOULDER ARTHROPLASTY Right  11/22/2017  . TOTAL SHOULDER ARTHROPLASTY Right 11/22/2017   Procedure: TOTAL SHOULDER ARTHROPLASTY;  Surgeon: Hiram Gash, MD;  Location: [place];  Service: Orthopedics;  Laterality: Right;  . ULTRASOUND GUIDANCE FOR VASCULAR ACCESS Bilateral 01/22/2020   Procedure: ULTRASOUND GUIDANCE FOR VASCULAR ACCESS, bilateral femoral arteries;  Surgeon: [name], MD;  Location: [facility] OR;  Service: Vascular;  Laterality: Bilateral;    Family History  Problem Relation Age of Onset  . Prostate cancer Father        dx. in his 57s  . Prostate cancer Brother        dx. in his mid-60s  . Breast cancer Sister 24       dx. in her 38s  . Dementia Mother   . Cancer Maternal Uncle        dx. in his 55s; unknown primary  . Cancer Maternal Uncle        dx. in his 70s; unknown primary  . Cancer Cousin        dx. >50; blood cancer    Social history:  reports that he quit smoking about 53 years ago. His smoking use included cigarettes. He has a 7.00 pack-year smoking history. He has never used smokeless tobacco. He reports current alcohol use of about 2.0 standard drinks of alcohol per week. He reports that he does not use drugs.  Medications:  Prior to Admission medications   Medication Sig Start Date End Date Taking? Authorizing Provider  acetaminophen (TYLENOL) 500 MG tablet Take 500 mg by mouth every 6 (six) hours as needed for moderate pain.    Yes [provider]  aspirin EC 81 MG EC tablet Take 1 tablet (81 mg total) by mouth daily at 6 (six) AM. Swallow whole. 01/24/20  Yes Rhyne, Samantha J, PA-C  diazepam (VALIUM) 10 MG tablet Take 10 mg by mouth as needed for anxiety.   Yes [provider]  melatonin 1 MG TABS tablet Take 2 mg by mouth at bedtime.   Yes [provider]  Multiple Vitamins-Minerals (PRESERVISION AREDS 2) CAPS Take 1 capsule by mouth daily.    Yes [provider]  Pediatric Multivitamins-Iron (CENTRUM JR/IRON PO) Take 1 tablet by mouth.   Yes [provider]  polyethylene glycol (MIRALAX) 17 g packet Take 17 g by mouth daily. 02/16/20  Yes Fawze, Mina A, PA-C  Polyvinyl Alcohol-Povidone (REFRESH OP) Place 2 drops into both eyes daily as needed (dry/irritated eyes).    Yes [provider]  rosuvastatin (CRESTOR) 10 MG tablet Take 1 tablet (10 mg total) by mouth daily. 01/23/20  Yes Rhyne, Hulen Shouts, PA-C      Allergies  Allergen Reactions  . Fluzone Quadrivalent [  Influenza Vac Split Quad] Other (See Comments)    GBS    ROS:  Out of a complete 14 system review of symptoms, the patient complains only of the following symptoms, and all other reviewed systems are negative.  Low back pain, groin pain Walking difficulty  Blood pressure (!) 175/91, pulse 87, height 5\' 7"  (1.702 m), weight 149 lb (67.6 kg).  Physical Exam  General: The patient is alert and cooperative at the time of the examination.  The patient is somewhat tremulous, in obvious pain.  Eyes: Pupils are equal, round, and reactive to light. Discs are flat bilaterally.  Neck: The neck is supple, no carotid bruits are noted.  Respiratory: The respiratory examination is clear.  Cardiovascular: The cardiovascular examination reveals a regular rate and rhythm, no obvious murmurs or rubs are noted.  Skin: Extremities are without significant edema.  Neurologic Exam  Mental status: The patient is alert and oriented x 3 at the time of the examination. The patient has apparent normal recent and remote memory, with an apparently normal attention span and concentration ability.  Cranial nerves: Facial symmetry is present. There is good sensation of the face to pinprick and soft touch bilaterally. The strength of the facial muscles and the muscles to head turning and shoulder shrug are normal bilaterally. Speech is well enunciated, no aphasia or dysarthria is noted. Extraocular movements are full. Visual fields are full. The tongue is midline, and the patient has  symmetric elevation of the soft palate. No obvious hearing deficits are noted.  Motor: The motor testing reveals 5 over 5 strength of all 4 extremities. Good symmetric motor tone is noted throughout.  Sensory: Sensory testing is intact to pinprick, soft touch, vibration sensation, and position sense on all 4 extremities, with exception of some decreased pinprick sensation in the feet bilaterally and some decreased vibration sensation in both feet, present at the ankles. No evidence of extinction is noted.  Coordination: Cerebellar testing reveals good finger-nose-finger and heel-to-shin bilaterally.  Gait and station: Gait is somewhat wide-based, slightly unsteady.  Patient is able to walk independently but usually uses a walker.  Romberg is negative.  Reflexes: Deep tendon reflexes are symmetric and normal bilaterally, with exception of depression of ankle jerk reflexes bilaterally. Toes are downgoing bilaterally.   CT abdomen and pelvis 04/07/20:  IMPRESSION: 1. No acute findings in the abdomen/pelvis. 2. Evidence of patient's infrarenal abdominal aortoiliac stent graft which is patent and unchanged. 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 3. Multiple bilateral parapelvic and cortical cysts unchanged. 4. Colonic diverticulosis without evidence of acute diverticulitis. Moderate fecal retention throughout the colon. 5. Aortic atherosclerosis.   Assessment/Plan:  1.  Lower abdominal pain and groin pain, low back pain  The patient had onset of back pain within a couple weeks following repair of an abdominal aortic aneurysm.  The etiology of this is not clear.  The patient does have some positional component to the back pain, we will set him up for MRI of the lumbar spine and thoracic spine, he will be placed on gabapentin 300 mg twice daily and given Ultram to take if  needed.  He will follow up here in 3 months.  He may require a pain center referral in the future.  Jill Alexanders MD 04/16/2020 11:47 AM  [facility] [address] [place], [place] [address]  Phone  [phone] Fax [phone]

## 2020-04-16 NOTE — Telephone Encounter (Signed)
Medicare/aarp order sent to GI. No auth they will reach out to the patient to schedule.  

## 2020-04-17 DIAGNOSIS — Z85038 Personal history of other malignant neoplasm of large intestine: Secondary | ICD-10-CM | POA: Diagnosis not present

## 2020-04-17 DIAGNOSIS — R296 Repeated falls: Secondary | ICD-10-CM | POA: Diagnosis not present

## 2020-04-17 DIAGNOSIS — E7800 Pure hypercholesterolemia, unspecified: Secondary | ICD-10-CM | POA: Diagnosis not present

## 2020-04-17 DIAGNOSIS — Z86010 Personal history of colonic polyps: Secondary | ICD-10-CM | POA: Diagnosis not present

## 2020-04-17 DIAGNOSIS — M6289 Other specified disorders of muscle: Secondary | ICD-10-CM | POA: Diagnosis not present

## 2020-04-17 DIAGNOSIS — D500 Iron deficiency anemia secondary to blood loss (chronic): Secondary | ICD-10-CM | POA: Diagnosis not present

## 2020-04-17 DIAGNOSIS — Z8546 Personal history of malignant neoplasm of prostate: Secondary | ICD-10-CM | POA: Diagnosis not present

## 2020-04-17 DIAGNOSIS — M1990 Unspecified osteoarthritis, unspecified site: Secondary | ICD-10-CM | POA: Diagnosis not present

## 2020-04-17 DIAGNOSIS — I10 Essential (primary) hypertension: Secondary | ICD-10-CM | POA: Diagnosis not present

## 2020-04-17 DIAGNOSIS — Z85820 Personal history of malignant melanoma of skin: Secondary | ICD-10-CM | POA: Diagnosis not present

## 2020-04-17 DIAGNOSIS — Z9181 History of falling: Secondary | ICD-10-CM | POA: Diagnosis not present

## 2020-04-17 DIAGNOSIS — K5901 Slow transit constipation: Secondary | ICD-10-CM | POA: Diagnosis not present

## 2020-04-17 DIAGNOSIS — Z96641 Presence of right artificial hip joint: Secondary | ICD-10-CM | POA: Diagnosis not present

## 2020-04-17 DIAGNOSIS — Z9889 Other specified postprocedural states: Secondary | ICD-10-CM | POA: Diagnosis not present

## 2020-04-20 ENCOUNTER — Other Ambulatory Visit: Payer: Self-pay

## 2020-04-20 ENCOUNTER — Ambulatory Visit
Admission: RE | Admit: 2020-04-20 | Discharge: 2020-04-20 | Disposition: A | Discharge status: Home / Self Care | Payer: Medicare Other | Source: Ambulatory Visit | Attending: Neurology | Admitting: Neurology

## 2020-04-20 DIAGNOSIS — M545 Low back pain, unspecified: Secondary | ICD-10-CM

## 2020-04-20 DIAGNOSIS — G8929 Other chronic pain: Secondary | ICD-10-CM

## 2020-04-20 MED ORDER — GADOBENATE DIMEGLUMINE 529 MG/ML IV SOLN
13.0000 mL | Freq: Once | INTRAVENOUS | Status: AC | PRN
  Administered 2020-04-20: 13 mL via INTRAVENOUS

## 2020-04-21 ENCOUNTER — Telehealth: Payer: Self-pay | Admitting: Neurology

## 2020-04-21 DIAGNOSIS — D500 Iron deficiency anemia secondary to blood loss (chronic): Secondary | ICD-10-CM | POA: Diagnosis not present

## 2020-04-21 DIAGNOSIS — I10 Essential (primary) hypertension: Secondary | ICD-10-CM | POA: Diagnosis not present

## 2020-04-21 DIAGNOSIS — M1990 Unspecified osteoarthritis, unspecified site: Secondary | ICD-10-CM | POA: Diagnosis not present

## 2020-04-21 DIAGNOSIS — E7800 Pure hypercholesterolemia, unspecified: Secondary | ICD-10-CM | POA: Diagnosis not present

## 2020-04-21 DIAGNOSIS — M6289 Other specified disorders of muscle: Secondary | ICD-10-CM | POA: Diagnosis not present

## 2020-04-21 DIAGNOSIS — R296 Repeated falls: Secondary | ICD-10-CM | POA: Diagnosis not present

## 2020-04-21 MED ORDER — GABAPENTIN 300 MG PO CAPS: 300.0000 mg | ORAL_CAPSULE | Freq: Three times a day (TID) | ORAL | 3 refills | Status: DC

## 2020-04-21 NOTE — Telephone Encounter (Signed)
I called the patient.  MRI of the lumbar and thoracic spine does show some degenerative changes without evidence of spinal stenosis or severe nerve root compression.  Etiology of his lower abdominal/pelvic pain is not clear.  He seems to be getting some benefit from gabapentin, they will try to go up to 300 mg 3 times daily on the medication.  He will follow-up in the office.    MRI lumbar 04/20/20:  IMPRESSION: This MRI of the lumbar spine without contrast shows the following: 1.    Bone marrow is homogenously hypointense on both T1 and T2 weighted images consistent with replacement of bone marrow fat.  These changes, to this extent, could be seen with bone marrow dyscrasias or with myelofibrosis. 2.    Multilevel degenerative changes as detailed above causing moderate lateral recess stenosis to the left at L3-L4, and moderate bilateral lateral recess stenosis at L4-L5.  There does not appear to be spinal stenosis or nerve root compression.   MRI thoracic 04/20/20:  IMPRESSION: This MRI of the thoracic spine with and without contrast shows the following: 1.   The spinal cord appears normal. 2.   Mild disc degenerative changes at T7-T8, T9-T10 and T10-T11 that do not lead to any nerve root compression or spinal stenosis. 3.   The bone marrow was very dark on both T1 and T2-weighted images.  This is due to replacement of bone marrow fat and could be due to either a bone marrow dyscrasia or myelofibrosis.  Further evaluation is recommended.

## 2020-04-22 ENCOUNTER — Other Ambulatory Visit: Payer: Self-pay

## 2020-04-22 ENCOUNTER — Ambulatory Visit (HOSPITAL_COMMUNITY): Payer: Medicare Other | Attending: Cardiovascular Disease

## 2020-04-22 DIAGNOSIS — R0602 Shortness of breath: Secondary | ICD-10-CM | POA: Diagnosis not present

## 2020-04-22 DIAGNOSIS — R55 Syncope and collapse: Secondary | ICD-10-CM | POA: Insufficient documentation

## 2020-04-22 LAB — ECHOCARDIOGRAM COMPLETE
Area-P 1/2: 2.15 cm2
MV M vel: 4.78 m/s
MV Peak grad: 91.4 mmHg
P 1/2 time: 525 msec
Radius: 0.35 cm
S' Lateral: 3.4 cm

## 2020-04-23 DIAGNOSIS — R296 Repeated falls: Secondary | ICD-10-CM | POA: Diagnosis not present

## 2020-04-23 DIAGNOSIS — I10 Essential (primary) hypertension: Secondary | ICD-10-CM | POA: Diagnosis not present

## 2020-04-23 DIAGNOSIS — D500 Iron deficiency anemia secondary to blood loss (chronic): Secondary | ICD-10-CM | POA: Diagnosis not present

## 2020-04-23 DIAGNOSIS — E7800 Pure hypercholesterolemia, unspecified: Secondary | ICD-10-CM | POA: Diagnosis not present

## 2020-04-23 DIAGNOSIS — M6289 Other specified disorders of muscle: Secondary | ICD-10-CM | POA: Diagnosis not present

## 2020-04-23 DIAGNOSIS — M1990 Unspecified osteoarthritis, unspecified site: Secondary | ICD-10-CM | POA: Diagnosis not present

## 2020-04-29 DIAGNOSIS — I10 Essential (primary) hypertension: Secondary | ICD-10-CM | POA: Diagnosis not present

## 2020-04-29 DIAGNOSIS — M6289 Other specified disorders of muscle: Secondary | ICD-10-CM | POA: Diagnosis not present

## 2020-04-29 DIAGNOSIS — R296 Repeated falls: Secondary | ICD-10-CM | POA: Diagnosis not present

## 2020-04-29 DIAGNOSIS — M1990 Unspecified osteoarthritis, unspecified site: Secondary | ICD-10-CM | POA: Diagnosis not present

## 2020-04-29 DIAGNOSIS — E7800 Pure hypercholesterolemia, unspecified: Secondary | ICD-10-CM | POA: Diagnosis not present

## 2020-04-29 DIAGNOSIS — D500 Iron deficiency anemia secondary to blood loss (chronic): Secondary | ICD-10-CM | POA: Diagnosis not present

## 2020-04-30 ENCOUNTER — Other Ambulatory Visit: Payer: Self-pay

## 2020-04-30 DIAGNOSIS — I2584 Coronary atherosclerosis due to calcified coronary lesion: Secondary | ICD-10-CM

## 2020-04-30 DIAGNOSIS — R55 Syncope and collapse: Secondary | ICD-10-CM

## 2020-04-30 DIAGNOSIS — R0602 Shortness of breath: Secondary | ICD-10-CM

## 2020-04-30 DIAGNOSIS — I2510 Atherosclerotic heart disease of native coronary artery without angina pectoris: Secondary | ICD-10-CM

## 2020-05-01 DIAGNOSIS — M6289 Other specified disorders of muscle: Secondary | ICD-10-CM | POA: Diagnosis not present

## 2020-05-01 DIAGNOSIS — E7800 Pure hypercholesterolemia, unspecified: Secondary | ICD-10-CM | POA: Diagnosis not present

## 2020-05-01 DIAGNOSIS — M1990 Unspecified osteoarthritis, unspecified site: Secondary | ICD-10-CM | POA: Diagnosis not present

## 2020-05-01 DIAGNOSIS — D500 Iron deficiency anemia secondary to blood loss (chronic): Secondary | ICD-10-CM | POA: Diagnosis not present

## 2020-05-01 DIAGNOSIS — R296 Repeated falls: Secondary | ICD-10-CM | POA: Diagnosis not present

## 2020-05-01 DIAGNOSIS — I10 Essential (primary) hypertension: Secondary | ICD-10-CM | POA: Diagnosis not present

## 2020-05-01 NOTE — Progress Notes (Signed)
[place]   [phone] [phone]   Clinic Follow up Note   Patient Care Team: Lawerance Cruel, MD as PCP - General (Family Medicine) Leighton Ruff, MD as Consulting Physician (General Surgery) Early, Arvilla Meres, MD as Consulting Physician (Vascular Surgery) Alla Feeling, NP as Nurse Practitioner (Nurse Practitioner) Truitt Merle, MD as Consulting Physician (Hematology)  Date of Service:  05/06/2020  CHIEF COMPLAINT: F/u of colon cancer   SUMMARY OF ONCOLOGIC HISTORY: Oncology History Overview Note  Cancer Staging 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 Staging form: Colon and Rectum, AJCC 8th Edition - Pathologic stage from 12/04/2019: Stage IIA (pT3, pN0, cM0) - Signed by Alla Feeling, NP on 12/19/2019    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 Annular lesion at the junction of the ascending colon and hepatic flexure, compatible with reported history of primary colonic malignancy. 2. Mildly prominent rounded right mesenteric lymph node near the colonic lesion, suspicious for local nodal metastasis. 3. Tiny pulmonary nodules in the lower lobes, largest 3 mm, recommend attention on follow-up chest CT in 3 months. 4. No additional potential findings of metastatic disease in the chest, abdomen or pelvis. 5. Infrarenal 5.6 cm abdominal aortic aneurysm. Greater than 5.5 cm Abdominal Aortic Aneurysm (ICD10-I71.9). Vascular surgery referral/consultation is recommended due to increased risk for rupture. This recommendation follows ACR consensus guidelines: White Paper of the ACR Incidental Findings Committee II on Vascular Findings. J Am Coll Radiol 2013; 10:789-794. 6. Moderate  sigmoid diverticulosis. 7. Aortic Atherosclerosis (ICD10-I70.0).   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 Leighton Ruff Dr. Michael Boston    12/04/2019 Pathology Results   FINAL MICROSCOPIC DIAGNOSIS: A. COLON, RIGHT, HEPATIC FLEXURE, PARTIAL COLECTOMY: - Moderately differentiated adenocarcinoma, spanning 4.7 cm. - Tumor invades through muscularis propria into pericolonic soft tissue. - Resection margins are negative. - Benign appendix with fibrous obliteration of the tip. - Twenty-four of twenty-four lymph nodes negative for carcinoma (0/24). - See oncology table. -MMR normal   01/17/2020 Genetic Testing   Negative genetic testing:  No pathogenic variants detected on the [facility] Common Hereditary Cancers panel + Melanoma panel. The report date is 01/17/2020.  The Common Hereditary Cancers Panel offered by [facility] includes sequencing and/or deletion duplication testing of the following 48 genes: APC, ATM, AXIN2, BARD1, BMPR1A, BRCA1, BRCA2, BRIP1, CDH1, CDK4, CDKN2A (p14ARF), CDKN2A (p16INK4a), CHEK2, CTNNA1, DICER1, EPCAM (Deletion/duplication testing only), GREM1 (promoter region deletion/duplication testing only), KIT, MEN1, MLH1, MSH2, MSH3, MSH6, MUTYH, NBN, NF1, NTHL1, PALB2, PDGFRA, PMS2, POLD1, POLE, PTEN, RAD50, RAD51C, RAD51D, RNF43, SDHB, SDHC, SDHD, SMAD4, SMARCA4. STK11, TP53, TSC1, TSC2, and VHL.  The following genes were evaluated for sequence changes only: SDHA and HOXB13 c.251G>A variant only. The Melanoma panel offered by [facility] includes sequencing and/or deletion duplication testing of the following 12 genes: BAP1, BRCA1, BRCA2, BRIP1, CDK4, CDKN2A (p14ARF), CDKN2A  (p16INK4a), MC1R, POT1, PTEN, RB1, TERT, and TP53.  The following gene was evaluated for sequence changes only:  MITF (c.952G>A, p.GLU318Lys variant only).   04/07/2020 Imaging   CT AP  IMPRESSION: 1. No acute findings in the abdomen/pelvis. 2. Evidence of patient's infrarenal abdominal aortoiliac stent graft which is patent and unchanged. 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 3. Multiple bilateral parapelvic and cortical cysts unchanged. 4. Colonic diverticulosis without evidence of acute diverticulitis. Moderate fecal retention throughout the colon. 5. Aortic atherosclerosis.   Aortic Atherosclerosis (ICD10-I70.0).      CURRENT THERAPY:  Surveillance   INTERVAL HISTORY:  Aaron Hawkins is here for a follow up of colon cancer. He presents to the clinic alone. He notes in the past 3 months he had lower abdominal pain that started in middle of July along with lower back pain that radiated around his waist. He needed walker last month, now he is about to be done with his cane. He had CT AP and MRI spines. He notes now his lower abdominal pain is resolving. His pain was managed with Gabapentin. His back pain is improving.     REVIEW OF SYSTEMS:   Constitutional: Denies fevers, chills or abnormal weight loss Eyes: Denies blurriness of vision Ears, nose, mouth, throat, and face: Denies mucositis or sore throat Respiratory: Denies cough, dyspnea or wheezes Cardiovascular: Denies palpitation, chest discomfort or lower extremity swelling Gastrointestinal:  Denies nausea, heartburn or change in bowel habits (+) Resolving lower abdominal pain MSK: (+) Lower abdominal pain  Skin: Denies abnormal skin rashes Lymphatics: Denies new lymphadenopathy or easy bruising Neurological:Denies numbness, tingling or new weaknesses Behavioral/Psych: Mood is stable, no new changes    All other systems were reviewed with the patient and are negative.  MEDICAL HISTORY:  Past Medical History:  Diagnosis Date  . AAA (abdominal aortic aneurysm) ([place])   . Anemia   . Arthritis    "probably in my knees and right shoulder" (11/22/2017)  . Coronary artery calcification 03/31/2020  . Duodenal ulcer 1964  . Family history of breast cancer   . Family history of prostate cancer   . Frequent falls   . History of Guillain-Barre syndrome 2003  . History of hiatal hernia    per patient about 20 years ago  . History of hydronephrosis    W/ STRICTURE - not aware of this  . History of melanoma   . History of melanoma excision    BACK-- 04-04-2007 W/ SLN BX RIGHT AXILL, right arm 2017  . History of nephrolithiasis    right kidney  . History of shingles 2020  . Iron deficiency   . Microscopic hematuria    history of  . Post herpetic neuralgia   . Prostate cancer ([place]) 05/30/13   gleason 3+3=6, volume 58 cc  . Pure hypercholesterolemia 03/31/2020  . Skin cancer 04/04/2007; 2017   "back, right arm"    SURGICAL HISTORY: Past Surgical History:  Procedure Laterality Date  . ABDOMINAL AORTIC ENDOVASCULAR STENT GRAFT N/A 01/22/2020   Procedure: ABDOMINAL AORTIC ENDOVASCULAR STENT GRAFT repair;  Surgeon: [name], MD;  Location: [facility] OR;  Service: Vascular;  Laterality: N/A;  . CATARACT EXTRACTION W/ INTRAOCULAR LENS  IMPLANT, BILATERAL Bilateral   . COLON SURGERY     removed intestine  colon ca  . HERNIA REPAIR  3790'W   w/mesh, umbilical  . INGUINAL HERNIA REPAIR Bilateral 1990's  . JOINT REPLACEMENT    . MELANOMA EXCISION  04/04/2007; 2017  back W/ SLN BX RIGHT AXILL; right arm  . PROSTATE BIOPSY  01/02/13   benign, one small focus of atypical cells right core  . PROSTATE BIOPSY  05/30/13   Gleason 3+3=6, volume 49 cc  . RADIOACTIVE SEED IMPLANT N/A 08/08/2013   Procedure: RADIOACTIVE SEED IMPLANT;  Surgeon: Molli Hazard, MD;  Location: [facility];   Service: Urology;  Laterality: N/A;   39 SEED IMPLANTED NO SEEDS FOUND IN BLADDER  . TONSILLECTOMY AND ADENOIDECTOMY  childhood  . TOTAL HIP ARTHROPLASTY Right 01-06-2009  . TOTAL SHOULDER ARTHROPLASTY Right 11/22/2017  . TOTAL SHOULDER ARTHROPLASTY Right 11/22/2017   Procedure: TOTAL SHOULDER ARTHROPLASTY;  Surgeon: Hiram Gash, MD;  Location: [place];  Service: Orthopedics;  Laterality: Right;  . ULTRASOUND GUIDANCE FOR VASCULAR ACCESS Bilateral 01/22/2020   Procedure: ULTRASOUND GUIDANCE FOR VASCULAR ACCESS, bilateral femoral arteries;  Surgeon: [name], MD;  Location: [facility] OR;  Service: Vascular;  Laterality: Bilateral;    I have reviewed the social history and family history with the patient and they are unchanged from previous note.  ALLERGIES:  is allergic to fluzone quadrivalent [influenza vac split quad].  MEDICATIONS:  Current Outpatient Medications  Medication Sig Dispense Refill  . acetaminophen (TYLENOL) 500 MG tablet Take 500 mg by mouth every 6 (six) hours as needed for moderate pain.     Marland Kitchen aspirin EC 81 MG EC tablet Take 1 tablet (81 mg total) by mouth daily at 6 (six) AM. Swallow whole.    . gabapentin (NEURONTIN) 300 MG capsule Take 1 capsule (300 mg total) by mouth 3 (three) times daily. 90 capsule 3  . melatonin 1 MG TABS tablet Take 2 mg by mouth at bedtime.    . Multiple Vitamins-Minerals (PRESERVISION AREDS 2) CAPS Take 1 capsule by mouth daily.     . Pediatric Multivitamins-Iron (CENTRUM JR/IRON PO) Take 1 tablet by mouth.    . polyethylene glycol (MIRALAX) 17 g packet Take 17 g by mouth daily. 14 each 0  . Polyvinyl Alcohol-Povidone (REFRESH OP) Place 2 drops into both eyes daily as needed (dry/irritated eyes).     . rosuvastatin (CRESTOR) 10 MG tablet Take 1 tablet (10 mg total) by mouth daily. 30 tablet 6  . traMADol (ULTRAM) 50 MG tablet Take 1 tablet (50 mg total) by mouth every 6 (six) hours as needed. 60 tablet 1   No current facility-administered  medications for this visit.    PHYSICAL EXAMINATION: ECOG PERFORMANCE STATUS: 2 - Symptomatic, <50% confined to bed  Vitals:   05/06/20 1025  BP: (!) 140/96  Pulse: 83  Resp: 18  Temp: 98.6 F (37 C)  SpO2: 98%   Filed Weights   05/06/20 1025  Weight: 154 lb 1.6 oz (69.9 kg)    GENERAL:alert, no distress and comfortable SKIN: skin color, texture, turgor are normal, no rashes or significant lesions EYES: normal, Conjunctiva are pink and non-injected, sclera clear  NECK: supple, thyroid normal size, non-tender, without nodularity LYMPH:  no palpable lymphadenopathy in the cervical, axillary  LUNGS: clear to auscultation and percussion with normal breathing effort HEART: regular rate & rhythm and no murmurs and no lower extremity edema ABDOMEN:abdomen soft, non-tender and normal bowel sounds Musculoskeletal:no cyanosis of digits and no clubbing  NEURO: alert & oriented x 3 with fluent speech, no focal motor/sensory deficits  LABORATORY DATA:  I have reviewed the data as listed CBC Latest Ref Rng & Units 05/06/2020 04/07/2020 03/20/2020  WBC 4.0 - 10.5 K/uL  5.2 5.4 6.1  Hemoglobin 13.0 - 17.0 g/dL 11.2(L) 10.6(L) 11.3(L)  Hematocrit 39 - 52 % 37.7(L) 36.2(L) 37.9(L)  Platelets 150 - 400 K/uL 259 321 381     CMP Latest Ref Rng & Units 05/06/2020 04/07/2020 03/20/2020  Glucose 70 - 99 mg/dL 120(H) 113(H) 125(H)  BUN 8 - 23 mg/dL _0 Creatinine 0.61 - 1.24 mg/dL 0.88 0.83 0.81  Sodium 135 - 145 mmol/L 141 138 142  Potassium 3.5 - 5.1 mmol/L 4.4 4.0 4.2  Chloride 98 - 111 mmol/L 104 100 102  CO2 22 - 32 mmol/L 32 29 32  Calcium 8.9 - 10.3 mg/dL 9.3 8.4(L) 9.0  Total Protein 6.5 - 8.1 g/dL 7.1 6.7 7.1  Total Bilirubin 0.3 - 1.2 mg/dL 0.4 0.5 0.3  Alkaline Phos 38 - 126 U/L 79 54 74  AST 15 - 41 U/L 9(L) 12(L) 9(L)  ALT 0 - 44 U/L <_1 RADIOGRAPHIC STUDIES: I have personally reviewed the radiological images as listed and agreed with the findings in the  report. No results found.   ASSESSMENT & PLAN:  Aaron Hawkins is a 79 y.o. male with    1. Adenocarcinoma of the hepatic flexure, G2, pT3N0M0 stage IIA, MMR normal  -He was found to have symptomatic anemia with exertional dyspnea when Hgb 7.4, He was referred for colonoscopy which showed biopsy-proven adenocarcinoma at the hepatic flexure.  -He underwent right colectomy per Dr. Marcello Moores on 12/04/19. His surgical path showed moderately differentiated adenocarcinoma involving the muscularis propria, negative margins, 0/32 positive LNs. No LVI or PNI.  -We previously discussed this is stage IIA colon cancer, likely cured by surgery. Due to no high risk features and his elderly age, adjuvant chemotherapy is not recommended. He is currently on surveillance.  -he recently developed low abdominal pain, and back pain, and underwent multiple scans.  I have reviewed his recent CT and MRI scan, no definitive evidence or concern for metastatic cancer.  I discussed with patient.  His symptom have much improved, he is on Neurontin. -From a colon cancer standpoint he is clinically doing well.  Physical exam unremarkable. Labs reviewed, Hg 11.3, BG 120, Albumin 2.8.  -Continue 5 year surveillance plan.  -F/u in 4 months, then every 6 months after    2. Anemia  -Found during work up for AAA in 08/2019, he was symptomatic with exertional dyspnea.  -He was treated with IV Feraheme in 09/2019, 12/2019. He is currently on oral iron once daily.  -For recent back and abdominal pain work up, his 04/20/20 MRI lumbar spine showed abnormal marrow signal which I suspect is related to his anemia. I do not suspect this is related to his cancer.  -Hg 11.2, MCV 78.4, MCH 23.3, MCHC 29.7 today (05/06/20)  3. H/o prostate cancer and melanoma, His 12/2019 genetic testing was negative for pathogenetic mutations with VUS POLE c.2090C>T -he had prostate cancer in 2008, s/p seed implant radiation  -melanoma removed from back and  right arm   4. AAA -He has vascular surgeon and underwent aneurysm repair on 01/22/20   5. Recent Lower abdominal and low back pain  -3 months ago he had onset of significant lower abdominal pain that started in middle of July along with lower back pain that radiated around his waist.  -He needed walker last month, now he is about to be done with his cane. He had CT AP and MRI spines. He notes now his  lower abdominal pain is resolving. His pain was managed with Gabapentin. His back pain is improving.   -I do not suspect this is related to his cancer.    PLAN: -lab and scan reviewed -Lab and f/u in 4 months    No problem-specific Assessment & Plan notes found for this encounter.   No orders of the defined types were placed in this encounter.  All questions were answered. The patient knows to call the clinic with any problems, questions or concerns. No barriers to learning was detected. The total time spent in the appointment was 30 minutes.     Truitt Merle, MD 05/06/2020   I, Joslyn Devon, am acting as scribe for Truitt Merle, MD.   I have reviewed the above documentation for accuracy and completeness, and I agree with the above.

## 2020-05-05 ENCOUNTER — Other Ambulatory Visit: Payer: Self-pay | Admitting: Hematology

## 2020-05-05 DIAGNOSIS — M6289 Other specified disorders of muscle: Secondary | ICD-10-CM | POA: Diagnosis not present

## 2020-05-05 DIAGNOSIS — I10 Essential (primary) hypertension: Secondary | ICD-10-CM | POA: Diagnosis not present

## 2020-05-05 DIAGNOSIS — D500 Iron deficiency anemia secondary to blood loss (chronic): Secondary | ICD-10-CM | POA: Diagnosis not present

## 2020-05-05 DIAGNOSIS — M1990 Unspecified osteoarthritis, unspecified site: Secondary | ICD-10-CM | POA: Diagnosis not present

## 2020-05-05 DIAGNOSIS — E7800 Pure hypercholesterolemia, unspecified: Secondary | ICD-10-CM | POA: Diagnosis not present

## 2020-05-05 DIAGNOSIS — R296 Repeated falls: Secondary | ICD-10-CM | POA: Diagnosis not present

## 2020-05-05 DIAGNOSIS — D508 Other iron deficiency anemias: Secondary | ICD-10-CM

## 2020-05-06 ENCOUNTER — Encounter: Payer: Self-pay | Admitting: Hematology

## 2020-05-06 ENCOUNTER — Other Ambulatory Visit: Payer: Self-pay

## 2020-05-06 ENCOUNTER — Inpatient Hospital Stay: Payer: Medicare Other | Attending: Nurse Practitioner | Admitting: Hematology

## 2020-05-06 ENCOUNTER — Inpatient Hospital Stay: Payer: Medicare Other

## 2020-05-06 VITALS — BP 140/96 | HR 83 | Temp 98.6°F | Resp 18 | Ht 67.0 in | Wt 154.1 lb

## 2020-05-06 DIAGNOSIS — D508 Other iron deficiency anemias: Secondary | ICD-10-CM

## 2020-05-06 DIAGNOSIS — I714 Abdominal aortic aneurysm, without rupture: Secondary | ICD-10-CM | POA: Insufficient documentation

## 2020-05-06 DIAGNOSIS — Z9049 Acquired absence of other specified parts of digestive tract: Secondary | ICD-10-CM | POA: Diagnosis not present

## 2020-05-06 DIAGNOSIS — Z96611 Presence of right artificial shoulder joint: Secondary | ICD-10-CM | POA: Insufficient documentation

## 2020-05-06 DIAGNOSIS — Z79899 Other long term (current) drug therapy: Secondary | ICD-10-CM | POA: Insufficient documentation

## 2020-05-06 DIAGNOSIS — C61 Malignant neoplasm of prostate: Secondary | ICD-10-CM

## 2020-05-06 DIAGNOSIS — Z96641 Presence of right artificial hip joint: Secondary | ICD-10-CM | POA: Insufficient documentation

## 2020-05-06 DIAGNOSIS — I2584 Coronary atherosclerosis due to calcified coronary lesion: Secondary | ICD-10-CM

## 2020-05-06 DIAGNOSIS — Z8546 Personal history of malignant neoplasm of prostate: Secondary | ICD-10-CM | POA: Diagnosis not present

## 2020-05-06 DIAGNOSIS — Z85820 Personal history of malignant melanoma of skin: Secondary | ICD-10-CM | POA: Diagnosis not present

## 2020-05-06 DIAGNOSIS — D649 Anemia, unspecified: Secondary | ICD-10-CM | POA: Diagnosis not present

## 2020-05-06 DIAGNOSIS — R1030 Lower abdominal pain, unspecified: Secondary | ICD-10-CM | POA: Insufficient documentation

## 2020-05-06 DIAGNOSIS — C183 Malignant neoplasm of hepatic flexure: Secondary | ICD-10-CM | POA: Diagnosis not present

## 2020-05-06 DIAGNOSIS — M545 Low back pain, unspecified: Secondary | ICD-10-CM | POA: Insufficient documentation

## 2020-05-06 DIAGNOSIS — Z85038 Personal history of other malignant neoplasm of large intestine: Secondary | ICD-10-CM | POA: Insufficient documentation

## 2020-05-06 DIAGNOSIS — I2510 Atherosclerotic heart disease of native coronary artery without angina pectoris: Secondary | ICD-10-CM

## 2020-05-06 LAB — IRON AND TIBC
Iron: 25 ug/dL — ABNORMAL LOW (ref 42–163)
Saturation Ratios: 11 % — ABNORMAL LOW (ref 20–55)
TIBC: 231 ug/dL (ref 202–409)
UIBC: 206 ug/dL (ref 117–376)

## 2020-05-06 LAB — CMP (CANCER CENTER ONLY)
ALT: <6 U/L (ref 0–44)
AST: 9 U/L — ABNORMAL LOW (ref 15–41)
Albumin: 2.8 g/dL — ABNORMAL LOW (ref 3.5–5.0)
Alkaline Phosphatase: 79 U/L (ref 38–126)
Anion gap: 5 (ref 5–15)
BUN: 22 mg/dL (ref 8–23)
CO2: 32 mmol/L (ref 22–32)
Calcium: 9.3 mg/dL (ref 8.9–10.3)
Chloride: 104 mmol/L (ref 98–111)
Creatinine: 0.88 mg/dL (ref 0.61–1.24)
GFR, Estimated: >60 mL/min (ref >60)
Glucose, Bld: 120 mg/dL — ABNORMAL HIGH (ref 70–99)
Potassium: 4.4 mmol/L (ref 3.5–5.1)
Sodium: 141 mmol/L (ref 135–145)
Total Bilirubin: 0.4 mg/dL (ref 0.3–1.2)
Total Protein: 7.1 g/dL (ref 6.5–8.1)

## 2020-05-06 LAB — CBC WITH DIFFERENTIAL (CANCER CENTER ONLY)
Abs Immature Granulocytes: 0.01 10*3/uL (ref 0.00–0.07)
Basophils Absolute: 0 10*3/uL (ref 0.0–0.1)
Basophils Relative: 1 %
Eosinophils Absolute: 0.1 10*3/uL (ref 0.0–0.5)
Eosinophils Relative: 1 %
HCT: 37.7 % — ABNORMAL LOW (ref 39.0–52.0)
Hemoglobin: 11.2 g/dL — ABNORMAL LOW (ref 13.0–17.0)
Immature Granulocytes: 0 %
Lymphocytes Relative: 31 %
Lymphs Abs: 1.6 10*3/uL (ref 0.7–4.0)
MCH: 23.3 pg — ABNORMAL LOW (ref 26.0–34.0)
MCHC: 29.7 g/dL — ABNORMAL LOW (ref 30.0–36.0)
MCV: 78.4 fL — ABNORMAL LOW (ref 80.0–100.0)
Monocytes Absolute: 0.5 10*3/uL (ref 0.1–1.0)
Monocytes Relative: 10 %
Neutro Abs: 3 10*3/uL (ref 1.7–7.7)
Neutrophils Relative %: 57 %
Platelet Count: 259 10*3/uL (ref 150–400)
RBC: 4.81 MIL/uL (ref 4.22–5.81)
RDW: 18.6 % — ABNORMAL HIGH (ref 11.5–15.5)
WBC Count: 5.2 10*3/uL (ref 4.0–10.5)
nRBC: 0 % (ref 0.0–0.2)

## 2020-05-06 LAB — FERRITIN: Ferritin: 95 ng/mL (ref 24–336)

## 2020-05-07 ENCOUNTER — Telehealth (HOSPITAL_COMMUNITY): Payer: Self-pay | Admitting: *Deleted

## 2020-05-07 NOTE — Telephone Encounter (Signed)
Close encounter 

## 2020-05-08 ENCOUNTER — Ambulatory Visit (HOSPITAL_COMMUNITY)
Admission: RE | Admit: 2020-05-08 | Discharge: 2020-05-08 | Disposition: A | Discharge status: Home / Self Care | Payer: Medicare Other | Source: Ambulatory Visit | Attending: Cardiology | Admitting: Cardiology

## 2020-05-08 ENCOUNTER — Other Ambulatory Visit: Payer: Self-pay

## 2020-05-08 ENCOUNTER — Other Ambulatory Visit: Payer: Self-pay | Admitting: Hematology

## 2020-05-08 DIAGNOSIS — R55 Syncope and collapse: Secondary | ICD-10-CM | POA: Insufficient documentation

## 2020-05-08 DIAGNOSIS — I2510 Atherosclerotic heart disease of native coronary artery without angina pectoris: Secondary | ICD-10-CM | POA: Insufficient documentation

## 2020-05-08 DIAGNOSIS — R0602 Shortness of breath: Secondary | ICD-10-CM | POA: Insufficient documentation

## 2020-05-08 DIAGNOSIS — I2584 Coronary atherosclerosis due to calcified coronary lesion: Secondary | ICD-10-CM | POA: Diagnosis not present

## 2020-05-08 DIAGNOSIS — D649 Anemia, unspecified: Secondary | ICD-10-CM

## 2020-05-08 MED ORDER — TECHNETIUM TC 99M PYROPHOSPHATE
21.8000 | Freq: Once | INTRAVENOUS | Status: AC
  Administered 2020-05-08: 21.8 via INTRAVENOUS

## 2020-05-12 ENCOUNTER — Other Ambulatory Visit: Payer: Self-pay | Admitting: Emergency Medicine

## 2020-05-12 MED ORDER — GABAPENTIN 300 MG PO CAPS: 300.0000 mg | ORAL_CAPSULE | Freq: Three times a day (TID) | ORAL | 3 refills | Status: DC

## 2020-05-12 NOTE — Progress Notes (Signed)
Resent prescription for gabapentin to [facility].

## 2020-05-13 DIAGNOSIS — M6289 Other specified disorders of muscle: Secondary | ICD-10-CM | POA: Diagnosis not present

## 2020-05-13 DIAGNOSIS — D500 Iron deficiency anemia secondary to blood loss (chronic): Secondary | ICD-10-CM | POA: Diagnosis not present

## 2020-05-13 DIAGNOSIS — I10 Essential (primary) hypertension: Secondary | ICD-10-CM | POA: Diagnosis not present

## 2020-05-13 DIAGNOSIS — E7800 Pure hypercholesterolemia, unspecified: Secondary | ICD-10-CM | POA: Diagnosis not present

## 2020-05-13 DIAGNOSIS — M1990 Unspecified osteoarthritis, unspecified site: Secondary | ICD-10-CM | POA: Diagnosis not present

## 2020-05-13 DIAGNOSIS — R296 Repeated falls: Secondary | ICD-10-CM | POA: Diagnosis not present

## 2020-05-17 DIAGNOSIS — M6289 Other specified disorders of muscle: Secondary | ICD-10-CM | POA: Diagnosis not present

## 2020-06-15 DIAGNOSIS — R1030 Lower abdominal pain, unspecified: Secondary | ICD-10-CM | POA: Diagnosis not present

## 2020-06-15 DIAGNOSIS — Z85038 Personal history of other malignant neoplasm of large intestine: Secondary | ICD-10-CM | POA: Diagnosis not present

## 2020-06-16 ENCOUNTER — Telehealth: Payer: Self-pay | Admitting: *Deleted

## 2020-06-16 DIAGNOSIS — Z01812 Encounter for preprocedural laboratory examination: Secondary | ICD-10-CM

## 2020-06-16 DIAGNOSIS — I517 Cardiomegaly: Secondary | ICD-10-CM

## 2020-06-16 NOTE — Telephone Encounter (Signed)
Advised patient, order placed, and message to scheduling to arrange

## 2020-06-16 NOTE — Telephone Encounter (Signed)
-----   Message from Skeet Latch, MD sent at 06/15/2020  9:08 AM EST ----- His study was indeterminate for amyloidosis, which is a disease where abnormal proteins deposit in the heart.  It can also cause neurologic abnormalities.  Recommend getting a cardiac MRI to better assess for whether this is the cause of his symptoms.

## 2020-06-17 ENCOUNTER — Telehealth: Payer: Self-pay | Admitting: Cardiovascular Disease

## 2020-06-17 NOTE — Telephone Encounter (Signed)
Spoke with patient and discussed preferred weekdays and times for scheduling the Cardiac MRI ordered by Dr. Oval Linsey.  Informed patient as soon as we hear from the insurance regarding the prior authorization, I will call with the appointment information.  Patient voiced his understanding

## 2020-06-18 ENCOUNTER — Ambulatory Visit: Payer: Medicare Other | Admitting: Nurse Practitioner

## 2020-06-18 ENCOUNTER — Other Ambulatory Visit: Payer: Medicare Other

## 2020-06-19 ENCOUNTER — Encounter: Payer: Self-pay | Admitting: Cardiovascular Disease

## 2020-06-19 NOTE — Telephone Encounter (Signed)
Spoke with patient regarding appointment for Cardiac MRI scheduled Thursday 07/02/20 at 7:00 am---arrival time is 6:30 am 1st floor admissions office at [facility]---Will mail information to patient and it is also available in My Chart.  Patient voiced is understanding.

## 2020-06-29 DIAGNOSIS — M1990 Unspecified osteoarthritis, unspecified site: Secondary | ICD-10-CM | POA: Diagnosis not present

## 2020-06-29 DIAGNOSIS — D509 Iron deficiency anemia, unspecified: Secondary | ICD-10-CM | POA: Diagnosis not present

## 2020-06-29 DIAGNOSIS — R1030 Lower abdominal pain, unspecified: Secondary | ICD-10-CM | POA: Diagnosis not present

## 2020-06-30 ENCOUNTER — Telehealth (HOSPITAL_COMMUNITY): Payer: Self-pay | Admitting: Emergency Medicine

## 2020-06-30 NOTE — Telephone Encounter (Signed)
Reaching out to patient to offer assistance regarding upcoming cardiac imaging study; pt verbalizes understanding of appt date/time, parking situation and where to check in, and verified current allergies; name and call back number provided for further questions should they arise Marchia Bond RN Navigator Cardiac Imaging [facility] [phone] office [phone] cell  Denies implants other than orthopedic implants, denies claustro

## 2020-07-02 ENCOUNTER — Ambulatory Visit (HOSPITAL_COMMUNITY)
Admission: RE | Admit: 2020-07-02 | Discharge: 2020-07-02 | Disposition: A | Discharge status: Home / Self Care | Payer: Medicare Other | Source: Ambulatory Visit | Attending: Cardiovascular Disease | Admitting: Cardiovascular Disease

## 2020-07-02 ENCOUNTER — Other Ambulatory Visit: Payer: Self-pay

## 2020-07-02 DIAGNOSIS — I517 Cardiomegaly: Secondary | ICD-10-CM | POA: Diagnosis not present

## 2020-07-02 MED ORDER — GADOBUTROL 1 MMOL/ML IV SOLN
8.0000 mL | Freq: Once | INTRAVENOUS | Status: AC | PRN
  Administered 2020-07-02: 8 mL via INTRAVENOUS

## 2020-07-20 ENCOUNTER — Encounter: Payer: Self-pay | Admitting: Neurology

## 2020-07-20 ENCOUNTER — Ambulatory Visit (INDEPENDENT_AMBULATORY_CARE_PROVIDER_SITE_OTHER): Payer: Medicare Other | Admitting: Neurology

## 2020-07-20 DIAGNOSIS — R109 Unspecified abdominal pain: Secondary | ICD-10-CM | POA: Insufficient documentation

## 2020-07-20 DIAGNOSIS — R1084 Generalized abdominal pain: Secondary | ICD-10-CM | POA: Diagnosis not present

## 2020-07-20 NOTE — Progress Notes (Signed)
Reason for visit: Abdominal pain  Aaron Hawkins is an 80 y.o. male  History of present illness:  Aaron Hawkins is an 80 year old right-handed white male with a history of an abdominal aortic aneurysm repair in July 2021.  Within 2 weeks following this he began developing severe abdominal discomfort, the source was never fully determined.  The patient underwent MRI of the thoracic and lumbar spine without any clear evidence of spinal cord ischemia or spinal stenosis.  The patient had no evidence of ischemic bowel.  He was placed on gabapentin and seemed to get some benefit with this.  He has been well maintained by taking 300 mg twice daily, he essentially has no pain.  He is back to walking over 2 miles a day.  He is sleeping well at night.  He returns for an evaluation.  He reports no leg weakness.  Past Medical History:  Diagnosis Date  . AAA (abdominal aortic aneurysm) ([place])   . Anemia   . Arthritis    "probably in my knees and right shoulder" (11/22/2017)  . Coronary artery calcification 03/31/2020  . Duodenal ulcer 1964  . Family history of breast cancer   . Family history of prostate cancer   . Frequent falls   . History of Guillain-Barre syndrome 2003  . History of hiatal hernia    per patient about 20 years ago  . History of hydronephrosis    W/ STRICTURE - not aware of this  . History of melanoma   . History of melanoma excision    BACK-- 04-04-2007 W/ SLN BX RIGHT AXILL, right arm 2017  . History of nephrolithiasis    right kidney  . History of shingles 2020  . Iron deficiency   . Microscopic hematuria    history of  . Post herpetic neuralgia   . Prostate cancer ([place]) 05/30/13   gleason 3+3=6, volume 58 cc  . Pure hypercholesterolemia 03/31/2020  . Skin cancer 04/04/2007; 2017   "back, right arm"    Past Surgical History:  Procedure Laterality Date  . ABDOMINAL AORTIC ENDOVASCULAR STENT GRAFT N/A 01/22/2020   Procedure: ABDOMINAL AORTIC ENDOVASCULAR STENT GRAFT  repair;  Surgeon: [name], MD;  Location: [facility] OR;  Service: Vascular;  Laterality: N/A;  . CATARACT EXTRACTION W/ INTRAOCULAR LENS  IMPLANT, BILATERAL Bilateral   . COLON SURGERY     removed intestine  colon ca  . HERNIA REPAIR  [id number]   w/mesh, umbilical  . INGUINAL HERNIA REPAIR Bilateral 1990's  . JOINT REPLACEMENT    . MELANOMA EXCISION  04/04/2007; 2017   back W/ SLN BX RIGHT AXILL; right arm  . PROSTATE BIOPSY  01/02/13   benign, one small focus of atypical cells right core  . PROSTATE BIOPSY  05/30/13   Gleason 3+3=6, volume 49 cc  . RADIOACTIVE SEED IMPLANT N/A 08/08/2013   Procedure: RADIOACTIVE SEED IMPLANT;  Surgeon: Molli Hazard, MD;  Location[facility];  Service: Urology;  Laterality: N/A;   16 SEED IMPLANTED NO SEEDS FOUND IN BLADDER  . TONSILLECTOMY AND ADENOIDECTOMY  childhood  . TOTAL HIP ARTHROPLASTY Right 01-06-2009  . TOTAL SHOULDER ARTHROPLASTY Right 11/22/2017  . TOTAL SHOULDER ARTHROPLASTY Right 11/22/2017   Procedure: TOTAL SHOULDER ARTHROPLASTY;  Surgeon: Hiram Gash, MD;  Location: [place];  Service: Orthopedics;  Laterality: Right;  . ULTRASOUND GUIDANCE FOR VASCULAR ACCESS Bilateral 01/22/2020   Procedure: ULTRASOUND GUIDANCE FOR VASCULAR ACCESS, bilateral femoral arteries;  Surgeon: [name],  MD;  Location: [facility] OR;  Service: Vascular;  Laterality: Bilateral;    Family History  Problem Relation Age of Onset  . Prostate cancer Father        dx. in his 64s  . Prostate cancer Brother        dx. in his mid-60s  . Breast cancer Sister 52       dx. in her 66s  . Dementia Mother   . Cancer Maternal Uncle        dx. in his 48s; unknown primary  . Cancer Maternal Uncle        dx. in his 30s; unknown primary  . Cancer Cousin        dx. >50; blood cancer    Social history:  reports that he quit smoking about 54 years ago. His smoking use included cigarettes. He has a 7.00 pack-year smoking history. He has never used smokeless  tobacco. He reports current alcohol use of about 2.0 standard drinks of alcohol per week. He reports that he does not use drugs.    Allergies  Allergen Reactions  . Fluzone Quadrivalent [Influenza Vac Split Quad] Other (See Comments)    GBS    Medications:  Prior to Admission medications   Medication Sig Start Date End Date Taking? Authorizing Provider  acetaminophen (TYLENOL) 500 MG tablet Take 500 mg by mouth every 6 (six) hours as needed for moderate pain.    Yes [provider]  aspirin EC 81 MG EC tablet Take 1 tablet (81 mg total) by mouth daily at 6 (six) AM. Swallow whole. 01/24/20  Yes Rhyne, Samantha J, PA-C  gabapentin (NEURONTIN) 300 MG capsule Take 1 capsule (300 mg total) by mouth 3 (three) times daily. 05/12/20  Yes York Spaniel, MD  Multiple Vitamins-Minerals (PRESERVISION AREDS 2) CAPS Take 1 capsule by mouth daily.    Yes [provider]  Pediatric Multivitamins-Iron (CENTRUM JR/IRON PO) Take 1 tablet by mouth.   Yes [provider]  polyethylene glycol (MIRALAX) 17 g packet Take 17 g by mouth daily. 02/16/20  Yes Fawze, Mina A, PA-C  Polyvinyl Alcohol-Povidone (REFRESH OP) Place 2 drops into both eyes daily as needed (dry/irritated eyes).    Yes [provider]  rosuvastatin (CRESTOR) 10 MG tablet Take 1 tablet (10 mg total) by mouth daily. 01/23/20  Yes Rhyne, Ames Coupe, PA-C  traMADol (ULTRAM) 50 MG tablet Take 1 tablet (50 mg total) by mouth every 6 (six) hours as needed. 04/16/20  Yes York Spaniel, MD  melatonin 1 MG TABS tablet Take 2 mg by mouth at bedtime.    [provider]    ROS:  Out of a complete 14 system review of symptoms, the patient complains only of the following symptoms, and all other reviewed systems are negative.  History of abdominal pain  Blood pressure (!) 183/94, pulse 83, height 5' 7.5" (1.715 m), weight 153 lb (69.4 kg).  Physical Exam  General: The patient is alert and cooperative at the  time of the examination.  Skin: No significant peripheral edema is noted.   Neurologic Exam  Mental status: The patient is alert and oriented x 3 at the time of the examination. The patient has apparent normal recent and remote memory, with an apparently normal attention span and concentration ability.   Cranial nerves: Facial symmetry is present. Speech is normal, no aphasia or dysarthria is noted. Extraocular movements are full. Visual fields are full.  Motor: The patient has good  strength in all 4 extremities.  Sensory examination: Soft touch sensation is symmetric on the face, arms, and legs.  Coordination: The patient has good finger-nose-finger and heel-to-shin bilaterally.  Gait and station: The patient has a normal gait. Tandem gait is very slightly unsteady. Romberg is negative. No drift is seen.  Reflexes: Deep tendon reflexes are symmetric, depressed in the lower extremities.   Assessment/Plan:  1.  Abdominal pain, resolved  At this point, the patient likely can come off of the gabapentin, he is to go to 300 mg at night for 2 weeks and then stop the medication.  If the pain returns he will get back on the drug.  Otherwise, he will follow up here if needed.   Jill Alexanders MD 07/20/2020 11:44 AM  [facility] [address] [place], [place] [address]  Phone [phone] Fax [phone]

## 2020-07-24 ENCOUNTER — Ambulatory Visit: Payer: Medicare Other | Attending: Internal Medicine

## 2020-07-24 DIAGNOSIS — Z23 Encounter for immunization: Secondary | ICD-10-CM

## 2020-07-24 NOTE — Progress Notes (Signed)
   Covid-19 Vaccination Clinic  Name:  Aaron Hawkins    MRN: [id number] DOB: [birth year]  07/24/2020  Aaron Hawkins was observed post Covid-19 immunization for 15 minutes without incident. He was provided with Vaccine Information Sheet and instruction to access the V-Safe system.   Aaron Hawkins was instructed to call 911 with any severe reactions post vaccine: Marland Kitchen Difficulty breathing  . Swelling of face and throat  . A fast heartbeat  . A bad rash all over body  . Dizziness and weakness   Immunizations Administered    Name Date Dose VIS Date Route   Pfizer COVID-19 Vaccine 07/24/2020  2:21 PM 0.3 mL 05/06/2020 Intramuscular   Manufacturer: [place]   Lot: [id number]   NDC: [address]-1

## 2020-09-04 NOTE — Progress Notes (Signed)
[place]   [phone] [phone]   Clinic Follow up Note   Patient Care Team: Lawerance Cruel, MD as PCP - General (Family Medicine) Leighton Ruff, MD as Consulting Physician (General Surgery) Early, Arvilla Meres, MD as Consulting Physician (Vascular Surgery) Alla Feeling, NP as Nurse Practitioner (Nurse Practitioner) Truitt Merle, MD as Consulting Physician (Hematology)  Date of Service:  09/07/2020  CHIEF COMPLAINT: F/u of colon cancer   SUMMARY OF ONCOLOGIC HISTORY: Oncology History Overview Note  Cancer Staging 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 Staging form: Colon and Rectum, AJCC 8th Edition - Pathologic stage from 12/04/2019: Stage IIA (pT3, pN0, cM0) - Signed by Alla Feeling, NP on 12/19/2019    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 Annular lesion at the junction of the ascending colon and hepatic flexure, compatible with reported history of primary colonic malignancy. 2. Mildly prominent rounded right mesenteric lymph node near the colonic lesion, suspicious for local nodal metastasis. 3. Tiny pulmonary nodules in the lower lobes, largest 3 mm, recommend attention on follow-up chest CT in 3 months. 4. No additional potential findings of metastatic disease in the chest, abdomen or pelvis. 5. Infrarenal 5.6 cm abdominal aortic aneurysm. Greater than 5.5 cm Abdominal Aortic Aneurysm (ICD10-I71.9). Vascular surgery referral/consultation is recommended due to increased risk for rupture. This recommendation follows ACR consensus guidelines: White Paper of the ACR Incidental Findings Committee II on Vascular Findings. J Am Coll Radiol 2013; 10:789-794. 6. Moderate sigmoid  diverticulosis. 7. Aortic Atherosclerosis (ICD10-I70.0).   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 Leighton Ruff Dr. Michael Boston    12/04/2019 Pathology Results   FINAL MICROSCOPIC DIAGNOSIS: A. COLON, RIGHT, HEPATIC FLEXURE, PARTIAL COLECTOMY: - Moderately differentiated adenocarcinoma, spanning 4.7 cm. - Tumor invades through muscularis propria into pericolonic soft tissue. - Resection margins are negative. - Benign appendix with fibrous obliteration of the tip. - Twenty-four of twenty-four lymph nodes negative for carcinoma (0/24). - See oncology table. -MMR normal   01/17/2020 Genetic Testing   Negative genetic testing:  No pathogenic variants detected on the [facility] Common Hereditary Cancers panel + Melanoma panel. The report date is 01/17/2020.  The Common Hereditary Cancers Panel offered by [facility] includes sequencing and/or deletion duplication testing of the following 48 genes: APC, ATM, AXIN2, BARD1, BMPR1A, BRCA1, BRCA2, BRIP1, CDH1, CDK4, CDKN2A (p14ARF), CDKN2A (p16INK4a), CHEK2, CTNNA1, DICER1, EPCAM (Deletion/duplication testing only), GREM1 (promoter region deletion/duplication testing only), KIT, MEN1, MLH1, MSH2, MSH3, MSH6, MUTYH, NBN, NF1, NTHL1, PALB2, PDGFRA, PMS2, POLD1, POLE, PTEN, RAD50, RAD51C, RAD51D, RNF43, SDHB, SDHC, SDHD, SMAD4, SMARCA4. STK11, TP53, TSC1, TSC2, and VHL.  The following genes were evaluated for sequence changes only: SDHA and HOXB13 c.251G>A variant only. The Melanoma panel offered by [facility] includes sequencing and/or deletion duplication testing of the following 12 genes: BAP1, BRCA1, BRCA2, BRIP1, CDK4, CDKN2A (p14ARF), CDKN2A (p16INK4a),  MC1R, POT1, PTEN, RB1, TERT, and TP53.  The following gene was evaluated for sequence changes only:  MITF (c.952G>A, p.GLU318Lys variant only).   04/07/2020 Imaging   CT AP  IMPRESSION: 1. No acute findings in the abdomen/pelvis. 2. Evidence of patient's infrarenal abdominal aortoiliac stent graft which is patent and unchanged. 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 3. Multiple bilateral parapelvic and cortical cysts unchanged. 4. Colonic diverticulosis without evidence of acute diverticulitis. Moderate fecal retention throughout the colon. 5. Aortic atherosclerosis.   Aortic Atherosclerosis (ICD10-I70.0).      CURRENT THERAPY:  Surveillance   INTERVAL HISTORY:  Aaron Hawkins is here for a follow up of colon cancer. He was last seen by me 4 months ago. He presents to the clinic with his wife. He notes he is doing well. In Fall 2021 he was sick and lost 20 pounds. He has recovered and able to gain weight back. He notes prior abdominal pain has resolved. Etiology is still not sure. He denies any new concern, SOB, cough, chest pain, nausea, bloating or pain.     REVIEW OF SYSTEMS:   Constitutional: Denies fevers, chills or abnormal weight loss Eyes: Denies blurriness of vision Ears, nose, mouth, throat, and face: Denies mucositis or sore throat Respiratory: Denies cough, dyspnea or wheezes Cardiovascular: Denies palpitation, chest discomfort or lower extremity swelling Gastrointestinal:  Denies nausea, heartburn or change in bowel habits Skin: Denies abnormal skin rashes Lymphatics: Denies new lymphadenopathy or easy bruising Neurological:Denies numbness, tingling or new weaknesses Behavioral/Psych: Mood is stable, no new changes  All other systems were reviewed with the patient and are negative.  MEDICAL HISTORY:  Past Medical History:  Diagnosis Date  .  AAA (abdominal aortic aneurysm) ([place])   . Anemia   . Arthritis    "probably in my knees and right shoulder" (11/22/2017)  . Coronary artery calcification 03/31/2020  . Duodenal ulcer 1964  . Family history of breast cancer   . Family history of prostate cancer   . Frequent falls   . History of Guillain-Barre syndrome 2003  . History of hiatal hernia    per patient about 20 years ago  . History of hydronephrosis    W/ STRICTURE - not aware of this  . History of melanoma   . History of melanoma excision    BACK-- 04-04-2007 W/ SLN BX RIGHT AXILL, right arm 2017  . History of nephrolithiasis    right kidney  . History of shingles 2020  . Iron deficiency   . Microscopic hematuria    history of  . Post herpetic neuralgia   . Prostate cancer ([place]) 05/30/13   gleason 3+3=6, volume 58 cc  . Pure hypercholesterolemia 03/31/2020  . Skin cancer 04/04/2007; 2017   "back, right arm"    SURGICAL HISTORY: Past Surgical History:  Procedure Laterality Date  . ABDOMINAL AORTIC ENDOVASCULAR STENT GRAFT N/A 01/22/2020   Procedure: ABDOMINAL AORTIC ENDOVASCULAR STENT GRAFT repair;  Surgeon: [name], MD;  Location: [facility] OR;  Service: Vascular;  Laterality: N/A;  . CATARACT EXTRACTION W/ INTRAOCULAR LENS  IMPLANT, BILATERAL Bilateral   . COLON SURGERY     removed intestine  colon ca  . HERNIA REPAIR  5993'T   w/mesh, umbilical  . INGUINAL HERNIA REPAIR Bilateral 1990's  . JOINT REPLACEMENT    . MELANOMA EXCISION  04/04/2007; 2017   back W/ SLN BX RIGHT AXILL; right arm  . PROSTATE BIOPSY  01/02/13   benign, one small focus of  atypical cells right core  . PROSTATE BIOPSY  05/30/13   Gleason 3+3=6, volume 49 cc  . RADIOACTIVE SEED IMPLANT N/A 08/08/2013   Procedure: RADIOACTIVE SEED IMPLANT;  Surgeon: Molli Hazard, MD;  Location: [facility];  Service: Urology;  Laterality: N/A;   71 SEED IMPLANTED NO SEEDS FOUND IN BLADDER  . TONSILLECTOMY AND ADENOIDECTOMY  childhood   . TOTAL HIP ARTHROPLASTY Right 01-06-2009  . TOTAL SHOULDER ARTHROPLASTY Right 11/22/2017  . TOTAL SHOULDER ARTHROPLASTY Right 11/22/2017   Procedure: TOTAL SHOULDER ARTHROPLASTY;  Surgeon: Hiram Gash, MD;  Location: [facility];  Service: Orthopedics;  Laterality: Right;  . ULTRASOUND GUIDANCE FOR VASCULAR ACCESS Bilateral 01/22/2020   Procedure: ULTRASOUND GUIDANCE FOR VASCULAR ACCESS, bilateral femoral arteries;  Surgeon: [name], MD;  Location: [facility] OR;  Service: Vascular;  Laterality: Bilateral;    I have reviewed the social history and family history with the patient and they are unchanged from previous note.  ALLERGIES:  is allergic to fluzone quadrivalent [influenza vac split quad].  MEDICATIONS:  Current Outpatient Medications  Medication Sig Dispense Refill  . acetaminophen (TYLENOL) 500 MG tablet Take 500 mg by mouth every 6 (six) hours as needed for moderate pain.     Marland Kitchen aspirin EC 81 MG EC tablet Take 1 tablet (81 mg total) by mouth daily at 6 (six) AM. Swallow whole.    . Multiple Vitamins-Minerals (PRESERVISION AREDS 2) CAPS Take 1 capsule by mouth daily.     . Pediatric Multivitamins-Iron (CENTRUM JR/IRON PO) Take 1 tablet by mouth.    . polyethylene glycol (MIRALAX) 17 g packet Take 17 g by mouth daily. 14 each 0  . Polyvinyl Alcohol-Povidone (REFRESH OP) Place 2 drops into both eyes daily as needed (dry/irritated eyes).     . rosuvastatin (CRESTOR) 10 MG tablet Take 1 tablet (10 mg total) by mouth daily. 30 tablet 6  . traMADol (ULTRAM) 50 MG tablet Take 1 tablet (50 mg total) by mouth every 6 (six) hours as needed. 60 tablet 1   No current facility-administered medications for this visit.    PHYSICAL EXAMINATION: ECOG PERFORMANCE STATUS: 0 - Asymptomatic  Vitals:   09/07/20 1015  BP: (!) 161/103  Pulse: 81  Resp: 16  Temp: 98 F (36.7 C)  SpO2: 97%   Filed Weights   09/07/20 1015  Weight: 160 lb 8 oz (72.8 kg)    GENERAL:alert, no distress and  comfortable SKIN: skin color, texture, turgor are normal, no rashes or significant lesions EYES: normal, Conjunctiva are pink and non-injected, sclera clear NECK: supple, thyroid normal size, non-tender, without nodularity LYMPH:  no palpable lymphadenopathy in the cervical, axillary  LUNGS: clear to auscultation and percussion with normal breathing effort HEART: regular rate & rhythm and no murmurs and no lower extremity edema ABDOMEN:abdomen soft, non-tender and normal bowel sounds Musculoskeletal:no cyanosis of digits and no clubbing  NEURO: alert & oriented x 3 with fluent speech, no focal motor/sensory deficits  LABORATORY DATA:  I have reviewed the data as listed CBC Latest Ref Rng & Units 09/07/2020 05/06/2020 04/07/2020  WBC 4.0 - 10.5 K/uL 6.7 5.2 5.4  Hemoglobin 13.0 - 17.0 g/dL 13.9 11.2(L) 10.6(L)  Hematocrit 39.0 - 52.0 % 45.5 37.7(L) 36.2(L)  Platelets 150 - 400 K/uL 213 259 321     CMP Latest Ref Rng & Units 09/07/2020 05/06/2020 04/07/2020  Glucose 70 - 99 mg/dL 98 120(H) 113(H)  BUN 8 - 23 mg/dL _0 Creatinine 0.61 -  1.24 mg/dL 1.01 0.88 0.83  Sodium 135 - 145 mmol/L 141 141 138  Potassium 3.5 - 5.1 mmol/L 4.6 4.4 4.0  Chloride 98 - 111 mmol/L 101 104 100  CO2 22 - 32 mmol/L 31 32 29  Calcium 8.9 - 10.3 mg/dL 9.5 9.3 8.4(L)  Total Protein 6.5 - 8.1 g/dL 7.8 7.1 6.7  Total Bilirubin 0.3 - 1.2 mg/dL 0.6 0.4 0.5  Alkaline Phos 38 - 126 U/L 85 79 54  AST 15 - 41 U/L 13(L) 9(L) 12(L)  ALT 0 - 44 U/L 10 <6 8      RADIOGRAPHIC STUDIES: I have personally reviewed the radiological images as listed and agreed with the findings in the report. No results found.   ASSESSMENT & PLAN:  Aaron Hawkins is a 80 y.o. male with   1. Adenocarcinoma of the hepatic flexure, G2, pT3N0M0 stage IIA, MMR normal  -He was found to have symptomatic anemia with exertional dyspnea when Hgb 7.4, He was referred for colonoscopy which showed biopsy-proven adenocarcinoma at the hepatic  flexure in 09/2019.  -He underwent right colectomy per Dr. Marcello Moores on 12/04/19. His surgical path showed moderately differentiated adenocarcinoma involving the muscularis propria, negative margins, 0/32 positive LNs. No LVI or PNI.  -We previously discussed this is stage IIA colon cancer, likely cured by surgery. Due to no high risk features and his elderly age, adjuvant chemotherapy is not recommended. He is currently on surveillance.  -He is clinically doing very well. He has recovered fully from pain and illness in 2021. Labs reviewed, CBC and CMP WNL except  MCV 78.4. CEA still pending. Physical exam unremarkable. There is no clinical concern for recurrence. -Pt notes during abdominal pain workup, he had colonoscopy with Dr Cristina Gong in Fall 2021. I will obtain report. -Next CT Scan in 03/2021.  -F/u in 4 months.   2. Anemia  -Found during work up for AAA in 08/2019, he was symptomatic with exertional dyspnea.  -He was treated with IV Feraheme in 09/2019, 12/2019. He is currently on oral iron once daily.  -For recent back and abdominal pain work up, his 04/20/20 MRI lumbar spine showed abnormal marrow signal which I suspect is related to his anemia. I do not suspect this is related to his cancer.  -His Iron panel and SPEP is still pending from today (09/07/20).   3. H/o prostate cancer and melanoma, His 12/2019 genetic testing was negative for pathogenetic mutations with VUS POLE c.2090C>T -he had prostate cancer in 2008, s/p seed implant radiation  -melanoma removed from back and right arm   4. AAA -He has vascular surgeon and underwent aneurysm repair on 01/22/20    PLAN: -Lab and f/u in 4 months, plan to order surveillance CT on next visit  -Will request colonoscopy from Fall 2021 Dr [facility] office    No problem-specific Assessment & Plan notes found for this encounter.   No orders of the defined types were placed in this encounter.  All questions were answered. The patient knows to  call the clinic with any problems, questions or concerns. No barriers to learning was detected. The total time spent in the appointment was 25 minutes.     Truitt Merle, MD 09/07/2020   I, Joslyn Devon, am acting as scribe for Truitt Merle, MD.   I have reviewed the above documentation for accuracy and completeness, and I agree with the above.

## 2020-09-07 ENCOUNTER — Encounter: Payer: Self-pay | Admitting: Hematology

## 2020-09-07 ENCOUNTER — Inpatient Hospital Stay: Payer: Medicare Other | Attending: Hematology

## 2020-09-07 ENCOUNTER — Inpatient Hospital Stay (HOSPITAL_BASED_OUTPATIENT_CLINIC_OR_DEPARTMENT_OTHER): Payer: Medicare Other | Admitting: Hematology

## 2020-09-07 ENCOUNTER — Telehealth: Payer: Self-pay

## 2020-09-07 ENCOUNTER — Telehealth: Payer: Self-pay | Admitting: Hematology

## 2020-09-07 ENCOUNTER — Other Ambulatory Visit: Payer: Self-pay

## 2020-09-07 VITALS — BP 161/103 | HR 81 | Temp 98.0°F | Resp 16 | Ht 67.5 in | Wt 160.5 lb

## 2020-09-07 DIAGNOSIS — Z85820 Personal history of malignant melanoma of skin: Secondary | ICD-10-CM | POA: Diagnosis not present

## 2020-09-07 DIAGNOSIS — D649 Anemia, unspecified: Secondary | ICD-10-CM | POA: Diagnosis not present

## 2020-09-07 DIAGNOSIS — Z8546 Personal history of malignant neoplasm of prostate: Secondary | ICD-10-CM | POA: Insufficient documentation

## 2020-09-07 DIAGNOSIS — C183 Malignant neoplasm of hepatic flexure: Secondary | ICD-10-CM

## 2020-09-07 DIAGNOSIS — Z85038 Personal history of other malignant neoplasm of large intestine: Secondary | ICD-10-CM | POA: Insufficient documentation

## 2020-09-07 DIAGNOSIS — I714 Abdominal aortic aneurysm, without rupture: Secondary | ICD-10-CM | POA: Diagnosis not present

## 2020-09-07 LAB — CMP (CANCER CENTER ONLY)
ALT: 10 U/L (ref 0–44)
AST: 13 U/L — ABNORMAL LOW (ref 15–41)
Albumin: 3.6 g/dL (ref 3.5–5.0)
Alkaline Phosphatase: 85 U/L (ref 38–126)
Anion gap: 9 (ref 5–15)
BUN: 16 mg/dL (ref 8–23)
CO2: 31 mmol/L (ref 22–32)
Calcium: 9.5 mg/dL (ref 8.9–10.3)
Chloride: 101 mmol/L (ref 98–111)
Creatinine: 1.01 mg/dL (ref 0.61–1.24)
GFR, Estimated: >60 mL/min (ref >60)
Glucose, Bld: 98 mg/dL (ref 70–99)
Potassium: 4.6 mmol/L (ref 3.5–5.1)
Sodium: 141 mmol/L (ref 135–145)
Total Bilirubin: 0.6 mg/dL (ref 0.3–1.2)
Total Protein: 7.8 g/dL (ref 6.5–8.1)

## 2020-09-07 LAB — CBC WITH DIFFERENTIAL (CANCER CENTER ONLY)
Abs Immature Granulocytes: 0.01 10*3/uL (ref 0.00–0.07)
Basophils Absolute: 0 10*3/uL (ref 0.0–0.1)
Basophils Relative: 0 %
Eosinophils Absolute: 0.1 10*3/uL (ref 0.0–0.5)
Eosinophils Relative: 1 %
HCT: 45.5 % (ref 39.0–52.0)
Hemoglobin: 13.9 g/dL (ref 13.0–17.0)
Immature Granulocytes: 0 %
Lymphocytes Relative: 32 %
Lymphs Abs: 2.1 10*3/uL (ref 0.7–4.0)
MCH: 24 pg — ABNORMAL LOW (ref 26.0–34.0)
MCHC: 30.5 g/dL (ref 30.0–36.0)
MCV: 78.4 fL — ABNORMAL LOW (ref 80.0–100.0)
Monocytes Absolute: 0.6 10*3/uL (ref 0.1–1.0)
Monocytes Relative: 8 %
Neutro Abs: 3.9 10*3/uL (ref 1.7–7.7)
Neutrophils Relative %: 59 %
Platelet Count: 213 10*3/uL (ref 150–400)
RBC: 5.8 MIL/uL (ref 4.22–5.81)
RDW: 18.2 % — ABNORMAL HIGH (ref 11.5–15.5)
WBC Count: 6.7 10*3/uL (ref 4.0–10.5)
nRBC: 0 % (ref 0.0–0.2)

## 2020-09-07 LAB — FERRITIN: Ferritin: 58 ng/mL (ref 24–336)

## 2020-09-07 LAB — IRON AND TIBC
Iron: 29 ug/dL — ABNORMAL LOW (ref 42–163)
Saturation Ratios: 10 % — ABNORMAL LOW (ref 20–55)
TIBC: 296 ug/dL (ref 202–409)
UIBC: 267 ug/dL (ref 117–376)

## 2020-09-07 LAB — CEA (IN HOUSE-CHCC): CEA (CHCC-In House): <1 ng/mL (ref 0.00–5.00)

## 2020-09-07 NOTE — Telephone Encounter (Signed)
Scheduled appointments per 2/21 los. Spoke to patient who is aware of appointments dates and times. Gave patient calendar print out.  

## 2020-09-07 NOTE — Telephone Encounter (Signed)
I left vm with [facility] records requesting last colonoscopy report.

## 2020-09-08 ENCOUNTER — Encounter: Payer: Self-pay | Admitting: Hematology

## 2020-09-08 LAB — KAPPA/LAMBDA LIGHT CHAINS
Kappa free light chain: 40 mg/L — ABNORMAL HIGH (ref 3.3–19.4)
Kappa, lambda light chain ratio: 1.21 (ref 0.26–1.65)
Lambda free light chains: 33 mg/L — ABNORMAL HIGH (ref 5.7–26.3)

## 2020-09-10 LAB — IMMUNOFIXATION REFLEX, SERUM
IgA: 254 mg/dL (ref 61–437)
IgG (Immunoglobin G), Serum: 1511 mg/dL (ref 603–1613)
IgM (Immunoglobulin M), Srm: 304 mg/dL — ABNORMAL HIGH (ref 15–143)

## 2020-09-10 LAB — PROTEIN ELECTROPHORESIS, SERUM, WITH REFLEX
A/G Ratio: 0.9 (ref 0.7–1.7)
Albumin ELP: 3.5 g/dL (ref 2.9–4.4)
Alpha-1-Globulin: 0.4 g/dL (ref 0.0–0.4)
Alpha-2-Globulin: 0.9 g/dL (ref 0.4–1.0)
Beta Globulin: 1 g/dL (ref 0.7–1.3)
Gamma Globulin: 1.6 g/dL (ref 0.4–1.8)
Globulin, Total: 3.9 g/dL (ref 2.2–3.9)
M-Spike, %: 0.5 g/dL — ABNORMAL HIGH
SPEP Interpretation: 0
Total Protein ELP: 7.4 g/dL (ref 6.0–8.5)

## 2020-09-11 ENCOUNTER — Encounter: Payer: Self-pay | Admitting: Nurse Practitioner

## 2020-09-15 ENCOUNTER — Encounter: Payer: Self-pay | Admitting: Hematology

## 2020-09-15 ENCOUNTER — Inpatient Hospital Stay: Payer: Medicare Other | Attending: Hematology | Admitting: Hematology

## 2020-09-15 DIAGNOSIS — D472 Monoclonal gammopathy: Secondary | ICD-10-CM

## 2020-09-15 NOTE — Progress Notes (Signed)
Aaron Hawkins   [phone] [phone]   Clinic Follow up Note   Patient Care Team: Lawerance Cruel, MD as PCP - General (Family Medicine) Leighton Ruff, MD as Consulting Physician (General Surgery) Early, Arvilla Meres, MD as Consulting Physician (Vascular Surgery) Alla Feeling, NP as Nurse Practitioner (Nurse Practitioner) Truitt Merle, MD as Consulting Physician (Hematology) 09/15/2020   I connected with Aaron Hawkins on 09/14/2020 at  11:30am EST by telephone telemedicine visit and verified that I am speaking with the correct person using two identifiers.   I discussed the limitations, risks, security and privacy concerns of performing an evaluation and management service by telephone and the availability of in person appointments. I also discussed with the patient that there may be a patient responsible charge related to this service. The patient expressed understanding and agreed to proceed.   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 Annular lesion at the junction of the ascending colon and hepatic flexure, compatible with reported history of primary colonic malignancy. 2. Mildly prominent rounded right mesenteric lymph node  near the colonic lesion, suspicious for local nodal metastasis. 3. Tiny pulmonary nodules in the lower lobes, largest 3 mm, recommend attention on follow-up chest CT in 3 months. 4. No additional potential findings of metastatic disease in the chest, abdomen or pelvis. 5. Infrarenal 5.6 cm abdominal aortic aneurysm. Greater than 5.5 cm Abdominal Aortic Aneurysm (ICD10-I71.9). Vascular surgery referral/consultation is recommended due to increased risk for rupture. This recommendation follows ACR consensus guidelines: White Paper of the ACR Incidental Findings Committee II on Vascular Findings. J Am Coll Radiol 2013; 10:789-794. 6. Moderate sigmoid diverticulosis. 7. Aortic Atherosclerosis (ICD10-I70.0).   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 Leighton Ruff Dr. Michael Boston    12/04/2019 Pathology Results   FINAL MICROSCOPIC DIAGNOSIS: A. COLON, RIGHT, HEPATIC FLEXURE, PARTIAL COLECTOMY: - Moderately differentiated adenocarcinoma, spanning 4.7 cm. - Tumor invades through muscularis propria into pericolonic soft tissue. - Resection margins are negative. - Benign appendix with fibrous obliteration of the tip. - Twenty-four of twenty-four lymph nodes negative for carcinoma (0/24). - See oncology table. -MMR normal   01/17/2020 Genetic Testing   Negative genetic testing:  No pathogenic variants detected on the [facility] Common Hereditary Cancers panel + Melanoma panel. The report date is 01/17/2020.  The Common Hereditary Cancers Panel offered by [facility] includes sequencing and/or deletion duplication testing of the following 48 genes: APC, ATM, AXIN2, BARD1, BMPR1A, BRCA1, BRCA2,  BRIP1, CDH1, CDK4,  CDKN2A (p14ARF), CDKN2A (p16INK4a), CHEK2, CTNNA1, DICER1, EPCAM (Deletion/duplication testing only), GREM1 (promoter region deletion/duplication testing only), KIT, MEN1, MLH1, MSH2, MSH3, MSH6, MUTYH, NBN, NF1, NTHL1, PALB2, PDGFRA, PMS2, POLD1, POLE, PTEN, RAD50, RAD51C, RAD51D, RNF43, SDHB, SDHC, SDHD, SMAD4, SMARCA4. STK11, TP53, TSC1, TSC2, and VHL.  The following genes were evaluated for sequence changes only: SDHA and HOXB13 c.251G>A variant only. The Melanoma panel offered by [facility] includes sequencing and/or deletion duplication testing of the following 12 genes: BAP1, BRCA1, BRCA2, BRIP1, CDK4, CDKN2A (p14ARF), CDKN2A (p16INK4a), MC1R, POT1, PTEN, RB1, TERT, and TP53.  The following gene was evaluated for sequence changes only: MITF (c.952G>A, p.GLU318Lys variant only).   04/07/2020 Imaging   CT AP  IMPRESSION: 1. No acute findings in the abdomen/pelvis. 2. Evidence of patient's infrarenal abdominal aortoiliac stent graft which is patent and unchanged. 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 3. Multiple bilateral parapelvic and cortical cysts unchanged. 4. Colonic diverticulosis without evidence of acute diverticulitis. Moderate fecal retention throughout the colon. 5. Aortic atherosclerosis.   Aortic Atherosclerosis (ICD10-I70.0).     CURRENT THERAPY: observation   INTERVAL HISTORY: Mr. Moscato is scheduled for a phone visit to discuss his recent lab results.  I saw him in my office 10 days ago, no new change since last visit.  He is doing well, denies any pain or other symptoms.   All other systems were reviewed with the patient and are negative.  MEDICAL HISTORY:  Past Medical History:  Diagnosis Date  . AAA (abdominal aortic aneurysm) ([place])   . Anemia   . Arthritis    "probably in my knees and right shoulder" (11/22/2017)  . Coronary  artery calcification 03/31/2020  . Duodenal ulcer 1964  . Family history of breast cancer   . Family history of prostate cancer   . Frequent falls   . History of Guillain-Barre syndrome 2003  . History of hiatal hernia    per patient about 20 years ago  . History of hydronephrosis    W/ STRICTURE - not aware of this  . History of melanoma   . History of melanoma excision    BACK-- 04-04-2007 W/ SLN BX RIGHT AXILL, right arm 2017  . History of nephrolithiasis    right kidney  . History of shingles 2020  . Iron deficiency   . Microscopic hematuria    history of  . Post herpetic neuralgia   . Prostate cancer ([place]) 05/30/13   gleason 3+3=6, volume 58 cc  . Pure hypercholesterolemia 03/31/2020  . Skin cancer 04/04/2007; 2017   "back, right arm"    SURGICAL HISTORY: Past Surgical History:  Procedure Laterality Date  . ABDOMINAL AORTIC ENDOVASCULAR STENT GRAFT N/A 01/22/2020   Procedure: ABDOMINAL AORTIC ENDOVASCULAR STENT GRAFT repair;  Surgeon: [name], MD;  Location: [facility] OR;  Service: Vascular;  Laterality: N/A;  . CATARACT EXTRACTION W/ INTRAOCULAR LENS  IMPLANT, BILATERAL Bilateral   . COLON SURGERY     removed intestine  colon ca  . HERNIA REPAIR  0165'V   w/mesh, umbilical  . INGUINAL HERNIA REPAIR Bilateral 1990's  . JOINT REPLACEMENT    . MELANOMA EXCISION  04/04/2007; 2017   back W/ SLN BX RIGHT AXILL; right arm  . PROSTATE BIOPSY  01/02/13   benign, one small focus of atypical cells right core  . PROSTATE BIOPSY  05/30/13   Gleason 3+3=6, volume 49  cc  . RADIOACTIVE SEED IMPLANT N/A 08/08/2013   Procedure: RADIOACTIVE SEED IMPLANT;  Surgeon: Molli Hazard, MD;  Location: [facility];  Service: Urology;  Laterality: N/A;   54 SEED IMPLANTED NO SEEDS FOUND IN BLADDER  . TONSILLECTOMY AND ADENOIDECTOMY  childhood  . TOTAL HIP ARTHROPLASTY Right 01-06-2009  . TOTAL SHOULDER ARTHROPLASTY Right 11/22/2017  . TOTAL SHOULDER ARTHROPLASTY Right  11/22/2017   Procedure: TOTAL SHOULDER ARTHROPLASTY;  Surgeon: Hiram Gash, MD;  Location: [facility];  Service: Orthopedics;  Laterality: Right;  . ULTRASOUND GUIDANCE FOR VASCULAR ACCESS Bilateral 01/22/2020   Procedure: ULTRASOUND GUIDANCE FOR VASCULAR ACCESS, bilateral femoral arteries;  Surgeon: [name], MD;  Location: [facility] OR;  Service: Vascular;  Laterality: Bilateral;    I have reviewed the social history and family history with the patient and they are unchanged from previous note.  ALLERGIES:  is allergic to fluzone quadrivalent [influenza vac split quad].  MEDICATIONS:  Current Outpatient Medications  Medication Sig Dispense Refill  . acetaminophen (TYLENOL) 500 MG tablet Take 500 mg by mouth every 6 (six) hours as needed for moderate pain.     Marland Kitchen aspirin EC 81 MG EC tablet Take 1 tablet (81 mg total) by mouth daily at 6 (six) AM. Swallow whole.    . Multiple Vitamins-Minerals (PRESERVISION AREDS 2) CAPS Take 1 capsule by mouth daily.     . Pediatric Multivitamins-Iron (CENTRUM JR/IRON PO) Take 1 tablet by mouth.    . polyethylene glycol (MIRALAX) 17 g packet Take 17 g by mouth daily. 14 each 0  . Polyvinyl Alcohol-Povidone (REFRESH OP) Place 2 drops into both eyes daily as needed (dry/irritated eyes).     . rosuvastatin (CRESTOR) 10 MG tablet Take 1 tablet (10 mg total) by mouth daily. 30 tablet 6  . traMADol (ULTRAM) 50 MG tablet Take 1 tablet (50 mg total) by mouth every 6 (six) hours as needed. 60 tablet 1   No current facility-administered medications for this visit.    PHYSICAL EXAMINATION: ECOG PERFORMANCE STATUS: 0 - Asymptomatic  no exam today   LABORATORY DATA:  I have reviewed the data as listed CBC Latest Ref Rng & Units 09/07/2020 05/06/2020 04/07/2020  WBC 4.0 - 10.5 K/uL 6.7 5.2 5.4  Hemoglobin 13.0 - 17.0 g/dL 13.9 11.2(L) 10.6(L)  Hematocrit 39.0 - 52.0 % 45.5 37.7(L) 36.2(L)  Platelets 150 - 400 K/uL 213 259 321     CMP Latest Ref Rng & Units 09/07/2020  05/06/2020 04/07/2020  Glucose 70 - 99 mg/dL 98 120(H) 113(H)  BUN 8 - 23 mg/dL _0 Creatinine 0.61 - 1.24 mg/dL 1.01 0.88 0.83  Sodium 135 - 145 mmol/L 141 141 138  Potassium 3.5 - 5.1 mmol/L 4.6 4.4 4.0  Chloride 98 - 111 mmol/L 101 104 100  CO2 22 - 32 mmol/L 31 32 29  Calcium 8.9 - 10.3 mg/dL 9.5 9.3 8.4(L)  Total Protein 6.5 - 8.1 g/dL 7.8 7.1 6.7  Total Bilirubin 0.3 - 1.2 mg/dL 0.6 0.4 0.5  Alkaline Phos 38 - 126 U/L 85 79 54  AST 15 - 41 U/L 13(L) 9(L) 12(L)  ALT 0 - 44 U/L 10 <6 8      RADIOGRAPHIC STUDIES: I have personally reviewed the radiological images as listed and agreed with the findings in the report. No results found.   ASSESSMENT & PLAN:  80 yo male   1. MGUS, IgM kappa  -During his recent anemia work-up, I ordered SPEP  with immunofixation, and light chain level.  He is clinically asymptomatic, no bone pain or other symptoms. -I reviewed his lab results, which showed monoclonal protein 0.5g/dl, elevated IgM level, and immunofixation confirmed IgM Kappa type.  His light chain level showed both elevated kappa and lambda light chain, with normal ratio -during his work up for abdominal and back pain, he had CT abdomen and pelvis, MRI of thoracic and lumbar spine in the fall 2021, which showed no evidence of lytic bone lesions -His recent lab showed normal CBC, renal function, and calcium level.  -I have no clinical suspicions for multiple myeloma, I think this is likely MGUS.  He also had cardiac MRI in December 2021 to rule out amyloidosis, which was negative. -I do not think he needs a bone marrow biopsy at this point, will reserve it if he has clinical concerns for disease progression  -I discussed the natural history of MGUS, and the small risk of evolving to multiple myeloma.  I recommend monitoring clinical symptoms in the lab every 3-4 months  -will see him back in 4 months with lab including 24 hr urine UPEP, and bone survey   2.  Adenocarcinoma of the  hepatic flexure, G2, pT3N0M0 stage IIA, MMR normal  -He is on surveillance, no clinical concern for recurrence.  Plan -Return to clinic in 4 months as scheduled -We will obtain lab of blood and urine test, bone survey 1 week before his next visit.  No orders of the defined types were placed in this encounter.  The total time spent in the appointment was 22 minutes and more than 50% was on counseling and review of test results     Truitt Merle, MD 09/15/20

## 2020-09-16 ENCOUNTER — Telehealth: Payer: Self-pay | Admitting: Hematology

## 2020-09-16 NOTE — Telephone Encounter (Signed)
Contacted patient about rescheduled appointment per 03/01 schedule message. Patient is aware.

## 2020-09-16 NOTE — Telephone Encounter (Signed)
Checked out appointment. No LOS notes needing to be scheduled. No changes made. 

## 2020-11-20 ENCOUNTER — Other Ambulatory Visit: Payer: Self-pay | Admitting: Hematology

## 2020-12-01 ENCOUNTER — Other Ambulatory Visit: Payer: Self-pay | Admitting: Hematology

## 2020-12-01 DIAGNOSIS — D472 Monoclonal gammopathy: Secondary | ICD-10-CM

## 2020-12-15 DIAGNOSIS — H524 Presbyopia: Secondary | ICD-10-CM | POA: Diagnosis not present

## 2020-12-15 DIAGNOSIS — H353132 Nonexudative age-related macular degeneration, bilateral, intermediate dry stage: Secondary | ICD-10-CM | POA: Diagnosis not present

## 2020-12-15 DIAGNOSIS — H35033 Hypertensive retinopathy, bilateral: Secondary | ICD-10-CM | POA: Diagnosis not present

## 2020-12-15 DIAGNOSIS — H35711 Central serous chorioretinopathy, right eye: Secondary | ICD-10-CM | POA: Diagnosis not present

## 2020-12-15 DIAGNOSIS — H5202 Hypermetropia, left eye: Secondary | ICD-10-CM | POA: Diagnosis not present

## 2020-12-15 DIAGNOSIS — H43813 Vitreous degeneration, bilateral: Secondary | ICD-10-CM | POA: Diagnosis not present

## 2020-12-15 DIAGNOSIS — H52221 Regular astigmatism, right eye: Secondary | ICD-10-CM | POA: Diagnosis not present

## 2020-12-15 DIAGNOSIS — H26491 Other secondary cataract, right eye: Secondary | ICD-10-CM | POA: Diagnosis not present

## 2020-12-21 DIAGNOSIS — N419 Inflammatory disease of prostate, unspecified: Secondary | ICD-10-CM | POA: Diagnosis not present

## 2020-12-21 DIAGNOSIS — R350 Frequency of micturition: Secondary | ICD-10-CM | POA: Diagnosis not present

## 2020-12-28 ENCOUNTER — Ambulatory Visit (HOSPITAL_COMMUNITY)
Admission: RE | Admit: 2020-12-28 | Discharge: 2020-12-28 | Disposition: A | Discharge status: Home / Self Care | Payer: Medicare Other | Source: Ambulatory Visit | Attending: Hematology | Admitting: Hematology

## 2020-12-28 ENCOUNTER — Inpatient Hospital Stay: Payer: Medicare Other | Attending: Hematology

## 2020-12-28 ENCOUNTER — Other Ambulatory Visit: Payer: Self-pay

## 2020-12-28 DIAGNOSIS — N390 Urinary tract infection, site not specified: Secondary | ICD-10-CM | POA: Insufficient documentation

## 2020-12-28 DIAGNOSIS — Z85038 Personal history of other malignant neoplasm of large intestine: Secondary | ICD-10-CM | POA: Insufficient documentation

## 2020-12-28 DIAGNOSIS — D472 Monoclonal gammopathy: Secondary | ICD-10-CM

## 2020-12-28 DIAGNOSIS — Z803 Family history of malignant neoplasm of breast: Secondary | ICD-10-CM | POA: Insufficient documentation

## 2020-12-28 DIAGNOSIS — Z9049 Acquired absence of other specified parts of digestive tract: Secondary | ICD-10-CM | POA: Insufficient documentation

## 2020-12-28 DIAGNOSIS — Z8042 Family history of malignant neoplasm of prostate: Secondary | ICD-10-CM | POA: Insufficient documentation

## 2020-12-29 LAB — KAPPA/LAMBDA LIGHT CHAINS
Kappa free light chain: 58.3 mg/L — ABNORMAL HIGH (ref 3.3–19.4)
Kappa, lambda light chain ratio: 1.4 (ref 0.26–1.65)
Lambda free light chains: 41.6 mg/L — ABNORMAL HIGH (ref 5.7–26.3)

## 2020-12-30 NOTE — Progress Notes (Signed)
[place]   [phone]) [phone] Fax:(336) [phone]   Clinic Follow up Note   Patient Care Team: Lawerance Cruel, MD as PCP - General (Family Medicine) Leighton Ruff, MD as Consulting Physician (General Surgery) Early, Arvilla Meres, MD as Consulting Physician (Vascular Surgery) Alla Feeling, NP as Nurse Practitioner (Nurse Practitioner) Truitt Merle, MD as Consulting Physician (Hematology)  Date of Service:  01/04/2021  CHIEF COMPLAINT: F/u of colon cancer   SUMMARY OF ONCOLOGIC HISTORY: Oncology History Overview Note  Cancer Staging 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 Staging form: Colon and Rectum, AJCC 8th Edition - Pathologic stage from 12/04/2019: Stage IIA (pT3, pN0, cM0) - Signed by Alla Feeling, NP on 12/19/2019    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 Annular lesion at the junction of the ascending colon and hepatic flexure, compatible with reported history of primary colonic malignancy. 2. Mildly prominent rounded right mesenteric lymph node near the colonic lesion, suspicious for local nodal metastasis. 3. Tiny pulmonary nodules in the lower lobes, largest 3 mm, recommend attention on follow-up chest CT in 3 months. 4. No additional potential findings of metastatic disease in the chest, abdomen or pelvis. 5. Infrarenal 5.6 cm abdominal aortic aneurysm. Greater than 5.5 cm Abdominal Aortic Aneurysm (ICD10-I71.9). Vascular surgery referral/consultation is recommended due to increased risk for rupture. This recommendation follows ACR consensus guidelines: White Paper of the ACR Incidental Findings Committee II on Vascular Findings. J Am Coll Radiol 2013; 10:789-794. 6. Moderate sigmoid  diverticulosis. 7. Aortic Atherosclerosis (ICD10-I70.0).   12/04/2019 Initial Diagnosis   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    12/04/2019 Cancer Staging   Staging form: Colon and Rectum, AJCC 8th Edition - Pathologic stage from 12/04/2019: Stage IIA (pT3, pN0, cM0) - Signed by Alla Feeling, NP on 12/19/2019    12/04/2019 Surgery   Robotic assisted right colectomy  Dr. Leighton Ruff Dr. Michael Boston    12/04/2019 Pathology Results   FINAL MICROSCOPIC DIAGNOSIS: A. COLON, RIGHT, HEPATIC FLEXURE, PARTIAL COLECTOMY: - Moderately differentiated adenocarcinoma, spanning 4.7 cm. - Tumor invades through muscularis propria into pericolonic soft tissue. - Resection margins are negative. - Benign appendix with fibrous obliteration of the tip. - Twenty-four of twenty-four lymph nodes negative for carcinoma (0/24). - See oncology table. -MMR normal   01/17/2020 Genetic Testing   Negative genetic testing:  No pathogenic variants detected on the [facility] Common Hereditary Cancers panel + Melanoma panel. The report date is 01/17/2020.  The Common Hereditary Cancers Panel offered by [facility] includes sequencing and/or deletion duplication testing of the following 48 genes: APC, ATM, AXIN2, BARD1, BMPR1A, BRCA1, BRCA2, BRIP1, CDH1, CDK4, CDKN2A (p14ARF), CDKN2A (p16INK4a), CHEK2, CTNNA1, DICER1, EPCAM (Deletion/duplication testing only), GREM1 (promoter region deletion/duplication testing only), KIT, MEN1, MLH1, MSH2, MSH3, MSH6, MUTYH, NBN, NF1, NTHL1, PALB2, PDGFRA, PMS2, POLD1, POLE, PTEN, RAD50, RAD51C, RAD51D, RNF43, SDHB, SDHC, SDHD, SMAD4, SMARCA4. STK11, TP53, TSC1, TSC2, and VHL.  The following genes were evaluated for sequence changes only: SDHA and HOXB13 c.251G>A variant only. The Melanoma panel offered by [facility] includes sequencing and/or deletion duplication testing of the following 12 genes: BAP1, BRCA1, BRCA2, BRIP1, CDK4, CDKN2A (p14ARF), CDKN2A (p16INK4a),  MC1R, POT1, PTEN, RB1, TERT, and TP53.  The following gene was evaluated for sequence  changes only: MITF (c.952G>A, p.GLU318Lys variant only).   04/07/2020 Imaging   CT AP  IMPRESSION: 1. No acute findings in the abdomen/pelvis. 2. Evidence of patient's infrarenal abdominal aortoiliac stent graft which is patent and unchanged. 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 3. Multiple bilateral parapelvic and cortical cysts unchanged. 4. Colonic diverticulosis without evidence of acute diverticulitis. Moderate fecal retention throughout the colon. 5. Aortic atherosclerosis.   Aortic Atherosclerosis (ICD10-I70.0).      CURRENT THERAPY:  Observation   INTERVAL HISTORY:  Aaron Hawkins is here for a follow up of colon cancer. He was last seen by me 3 months ago. He presents to the clinic with his wife. I reviewed his medication list with him. He is on bactrim for current UTI. He notes mild blood and protein in his urine sample with his PCP. He does have urinary urgency but no burning from his UTI. He fever has resolved.     REVIEW OF SYSTEMS:   Constitutional: Denies fevers, chills or abnormal weight loss Eyes: Denies blurriness of vision Ears, nose, mouth, throat, and face: Denies mucositis or sore throat Respiratory: Denies cough, dyspnea or wheezes Cardiovascular: Denies palpitation, chest discomfort or lower extremity swelling Gastrointestinal:  Denies nausea, heartburn or change in bowel habits Skin: Denies abnormal skin rashes Lymphatics: Denies new lymphadenopathy or easy bruising Neurological:Denies numbness, tingling or new weaknesses Behavioral/Psych: Mood is stable, no new changes  All other systems were reviewed with the patient and are negative.  MEDICAL HISTORY:  Past Medical History:  Diagnosis Date   AAA (abdominal aortic aneurysm) ([place])    Anemia     Arthritis    "probably in my knees and right shoulder" (11/22/2017)   Coronary artery calcification 03/31/2020   Duodenal ulcer 1964   Family history of breast cancer    Family history of prostate cancer    Frequent falls    History of Guillain-Barre syndrome 2003   History of hiatal hernia    per patient about 20 years ago   History of hydronephrosis    W/ STRICTURE - not aware of this   History of melanoma    History of melanoma excision    BACK-- 04-04-2007 W/ SLN BX RIGHT AXILL, right arm 2017   History of nephrolithiasis    right kidney   History of shingles 2020   Iron deficiency    Microscopic hematuria    history of   Post herpetic neuralgia    Prostate cancer ([place]) 05/30/13   gleason 3+3=6, volume 58 cc   Pure hypercholesterolemia 03/31/2020   Skin cancer 04/04/2007; 2017   "back, right arm"    SURGICAL HISTORY: Past Surgical History:  Procedure Laterality Date   ABDOMINAL AORTIC ENDOVASCULAR STENT GRAFT N/A 01/22/2020   Procedure: ABDOMINAL AORTIC ENDOVASCULAR STENT GRAFT repair;  Surgeon: [name], MD;  Location: [place];  Service: Vascular;  Laterality: N/A;   CATARACT EXTRACTION W/ INTRAOCULAR LENS  IMPLANT, BILATERAL Bilateral    COLON SURGERY     removed intestine  colon ca   HERNIA REPAIR  2229'N   w/mesh, umbilical   INGUINAL HERNIA REPAIR Bilateral 1990's   JOINT REPLACEMENT     MELANOMA EXCISION  04/04/2007; 2017   back W/ SLN BX RIGHT AXILL; right arm   PROSTATE BIOPSY  01/02/13   benign, one small focus of atypical cells right core  PROSTATE BIOPSY  05/30/13   Gleason 3+3=6, volume 49 cc   RADIOACTIVE SEED IMPLANT N/A 08/08/2013   Procedure: RADIOACTIVE SEED IMPLANT;  Surgeon: Molli Hazard, MD;  Location: [facility];  Service: Urology;  Laterality: N/A;   79 SEED IMPLANTED NO SEEDS FOUND IN BLADDER   TONSILLECTOMY AND ADENOIDECTOMY  childhood   TOTAL HIP ARTHROPLASTY Right 01-06-2009   TOTAL SHOULDER ARTHROPLASTY Right  11/22/2017   TOTAL SHOULDER ARTHROPLASTY Right 11/22/2017   Procedure: TOTAL SHOULDER ARTHROPLASTY;  Surgeon: Hiram Gash, MD;  Location: [place];  Service: Orthopedics;  Laterality: Right;   ULTRASOUND GUIDANCE FOR VASCULAR ACCESS Bilateral 01/22/2020   Procedure: ULTRASOUND GUIDANCE FOR VASCULAR ACCESS, bilateral femoral arteries;  Surgeon: [name], MD;  Location: [facility];  Service: Vascular;  Laterality: Bilateral;    I have reviewed the social history and family history with the patient and they are unchanged from previous note.  ALLERGIES:  is allergic to fluzone quadrivalent [influenza vac split quad].  MEDICATIONS:  Current Outpatient Medications  Medication Sig Dispense Refill   gabapentin (NEURONTIN) 300 MG capsule Take 300 mg by mouth at bedtime.     acetaminophen (TYLENOL) 500 MG tablet Take 500 mg by mouth every 6 (six) hours as needed for moderate pain.      Multiple Vitamins-Minerals (PRESERVISION AREDS 2) CAPS Take 1 capsule by mouth daily.      Pediatric Multivitamins-Iron (CENTRUM JR/IRON PO) Take 1 tablet by mouth.     rosuvastatin (CRESTOR) 10 MG tablet Take 1 tablet (10 mg total) by mouth daily. 30 tablet 6   No current facility-administered medications for this visit.    PHYSICAL EXAMINATION: ECOG PERFORMANCE STATUS: 0 - Asymptomatic  Vitals:   01/04/21 0953  BP: 138/78  Pulse: 85  Resp: 17  Temp: 97.9 F (36.6 C)  SpO2: 99%   Filed Weights   01/04/21 0953  Weight: 163 lb 1.6 oz (74 kg)    Due to COVID19 we will limit examination to appearance. Patient had no complaints.  GENERAL:alert, no distress and comfortable SKIN: skin color normal, no rashes or significant lesions EYES: normal, Conjunctiva are pink and non-injected, sclera clear  NEURO: alert & oriented x 3 with fluent speech   LABORATORY DATA:  I have reviewed the data as listed CBC Latest Ref Rng & Units 09/07/2020 05/06/2020 04/07/2020  WBC 4.0 - 10.5 K/uL 6.7 5.2 5.4  Hemoglobin 13.0 -  17.0 g/dL 13.9 11.2(L) 10.6(L)  Hematocrit 39.0 - 52.0 % 45.5 37.7(L) 36.2(L)  Platelets 150 - 400 K/uL 213 259 321     CMP Latest Ref Rng & Units 09/07/2020 05/06/2020 04/07/2020  Glucose 70 - 99 mg/dL 98 120(H) 113(H)  BUN 8 - 23 mg/dL _0 Creatinine 0.61 - 1.24 mg/dL 1.01 0.88 0.83  Sodium 135 - 145 mmol/L 141 141 138  Potassium 3.5 - 5.1 mmol/L 4.6 4.4 4.0  Chloride 98 - 111 mmol/L 101 104 100  CO2 22 - 32 mmol/L 31 32 29  Calcium 8.9 - 10.3 mg/dL 9.5 9.3 8.4(L)  Total Protein 6.5 - 8.1 g/dL 7.8 7.1 6.7  Total Bilirubin 0.3 - 1.2 mg/dL 0.6 0.4 0.5  Alkaline Phos 38 - 126 U/L 85 79 54  AST 15 - 41 U/L 13(L) 9(L) 12(L)  ALT 0 - 44 U/L 10 <6 8      RADIOGRAPHIC STUDIES: I have personally reviewed the radiological images as listed and agreed with the findings in the report. No  results found.   ASSESSMENT & PLAN:  Aaron Hawkins is a 80 y.o. male with    1. MGUS, IgM kappa -During his anemia work-up in early 2022 I ordered SPEP with immunofixation, and light chain level.  He is clinically asymptomatic, no bone pain or other symptoms. -08/2020 labs showed monoclonal protein 0.5g/dl, elevated IgM level, and immunofixation confirmed IgM Kappa type.  His light chain level showed both elevated kappa and lambda light chain, with normal ratio -his 12/28/20 Bone survey was negative for lytic or destructive bone lesions are identified to suggest multiple myeloma. -I have no clinical suspicions for multiple myeloma, I think this is likely MGUS.  He also had cardiac MRI in December 2021 to rule out amyloidosis, which was negative. -I discussed the natural history of MGUS, and the small risk of evolving to multiple myeloma.  I recommend monitoring clinical symptoms in the lab every 3-6 months. If M-Protein is increasing quickly I will recommend bone marrow biopsy.  -He is clinically doing well and stable. His 12/28/20 MM panel shows light chains slightly increased with normal ratio, M  protein is elevated at 1.0, higher than before.  Will proceed with 24 Hr urine in the next few weeks. Will repeat CBC and CMP on next lab draw also  -Will continue to observation with next labs in 3 months. Will hold on bone marrow biopsy for now  -f/u in 3 months.     2. Adenocarcinoma of the hepatic flexure, G2, pT3N0M0 stage IIA, MMR normal  -Diagnosed in 09/2019. He was referred for colonoscopy which showed biopsy-proven adenocarcinoma at the hepatic flexure. -He underwent right colectomy per Dr. Marcello Moores on 12/04/19. He had mostly uneventful postop course. Bowels improving, normal diet, no pain. Surgical path showed moderately differentiated adenocarcinoma involving the muscularis propria, negative margins, 0/32 positive LNs. No LVI or PNI -We discussed this is stage IIA colon cancer, likely cured by surgery. Due to no high risk features and his elderly age, adjuvant chemotherapy is not recommended. -He is on surveillance, no clinical concern for recurrence. Next CT CAP in 03/2021.     Plan -F/u in 3 months with lab and CT CAP a week before.  -he will return in a few weeks for lab (CBC, CMP and 24h urine)    No problem-specific Assessment & Plan notes found for this encounter.   Orders Placed This Encounter  Procedures   CT CHEST ABDOMEN PELVIS W CONTRAST    Standing Status:   Future    Standing Expiration Date:   01/04/2022    Order Specific Question:   If indicated for the ordered procedure, I authorize the administration of contrast media per Radiology protocol    Answer:   Yes    Order Specific Question:   Preferred imaging location?    Answer:   [facility]    Order Specific Question:   Release to patient    Answer:   Immediate    Order Specific Question:   Is Oral Contrast requested for this exam?    Answer:   Yes, Per Radiology protocol    Order Specific Question:   Reason for Exam (SYMPTOM  OR DIAGNOSIS REQUIRED)    Answer:   rule out cancer recurrence   All  questions were answered. The patient knows to call the clinic with any problems, questions or concerns. No barriers to learning was detected. The total time spent in the appointment was 30 minutes.     Truitt Merle, MD  01/04/2021   I, Joslyn Devon, am acting as scribe for Truitt Merle, MD.   I have reviewed the above documentation for accuracy and completeness, and I agree with the above.

## 2021-01-01 ENCOUNTER — Other Ambulatory Visit: Payer: Self-pay

## 2021-01-01 DIAGNOSIS — D472 Monoclonal gammopathy: Secondary | ICD-10-CM

## 2021-01-01 LAB — MULTIPLE MYELOMA PANEL, SERUM
Albumin SerPl Elph-Mcnc: 2.8 g/dL — ABNORMAL LOW (ref 2.9–4.4)
Albumin/Glob SerPl: 0.8 (ref 0.7–1.7)
Alpha 1: 0.4 g/dL (ref 0.0–0.4)
Alpha2 Glob SerPl Elph-Mcnc: 1 g/dL (ref 0.4–1.0)
B-Globulin SerPl Elph-Mcnc: 0.9 g/dL (ref 0.7–1.3)
Gamma Glob SerPl Elph-Mcnc: 1.6 g/dL (ref 0.4–1.8)
Globulin, Total: 4 g/dL — ABNORMAL HIGH (ref 2.2–3.9)
IgA: 231 mg/dL (ref 61–437)
IgG (Immunoglobin G), Serum: 1552 mg/dL (ref 603–1613)
IgM (Immunoglobulin M), Srm: 255 mg/dL — ABNORMAL HIGH (ref 15–143)
M Protein SerPl Elph-Mcnc: 1 g/dL — ABNORMAL HIGH
Total Protein ELP: 6.8 g/dL (ref 6.0–8.5)

## 2021-01-04 ENCOUNTER — Encounter: Payer: Self-pay | Admitting: Hematology

## 2021-01-04 ENCOUNTER — Other Ambulatory Visit: Payer: Medicare Other

## 2021-01-04 ENCOUNTER — Inpatient Hospital Stay (HOSPITAL_BASED_OUTPATIENT_CLINIC_OR_DEPARTMENT_OTHER): Payer: Medicare Other | Admitting: Hematology

## 2021-01-04 ENCOUNTER — Other Ambulatory Visit: Payer: Self-pay

## 2021-01-04 VITALS — BP 138/78 | HR 85 | Temp 97.9°F | Resp 17 | Ht 67.5 in | Wt 163.1 lb

## 2021-01-04 DIAGNOSIS — C183 Malignant neoplasm of hepatic flexure: Secondary | ICD-10-CM | POA: Diagnosis not present

## 2021-01-04 DIAGNOSIS — N390 Urinary tract infection, site not specified: Secondary | ICD-10-CM | POA: Diagnosis not present

## 2021-01-04 DIAGNOSIS — Z8042 Family history of malignant neoplasm of prostate: Secondary | ICD-10-CM | POA: Diagnosis not present

## 2021-01-04 DIAGNOSIS — Z9049 Acquired absence of other specified parts of digestive tract: Secondary | ICD-10-CM | POA: Diagnosis not present

## 2021-01-04 DIAGNOSIS — Z803 Family history of malignant neoplasm of breast: Secondary | ICD-10-CM | POA: Diagnosis not present

## 2021-01-04 DIAGNOSIS — Z85038 Personal history of other malignant neoplasm of large intestine: Secondary | ICD-10-CM | POA: Diagnosis not present

## 2021-01-20 ENCOUNTER — Inpatient Hospital Stay: Payer: Medicare Other | Attending: Hematology

## 2021-01-20 ENCOUNTER — Other Ambulatory Visit: Payer: Self-pay

## 2021-01-20 DIAGNOSIS — D472 Monoclonal gammopathy: Secondary | ICD-10-CM | POA: Diagnosis not present

## 2021-01-20 LAB — CBC WITH DIFFERENTIAL (CANCER CENTER ONLY)
Abs Immature Granulocytes: 0.03 10*3/uL (ref 0.00–0.07)
Basophils Absolute: 0 10*3/uL (ref 0.0–0.1)
Basophils Relative: 1 %
Eosinophils Absolute: 0 10*3/uL (ref 0.0–0.5)
Eosinophils Relative: 1 %
HCT: 42.5 % (ref 39.0–52.0)
Hemoglobin: 13.4 g/dL (ref 13.0–17.0)
Immature Granulocytes: 0 %
Lymphocytes Relative: 17 %
Lymphs Abs: 1.4 10*3/uL (ref 0.7–4.0)
MCH: 25.5 pg — ABNORMAL LOW (ref 26.0–34.0)
MCHC: 31.5 g/dL (ref 30.0–36.0)
MCV: 80.8 fL (ref 80.0–100.0)
Monocytes Absolute: 0.7 10*3/uL (ref 0.1–1.0)
Monocytes Relative: 9 %
Neutro Abs: 5.9 10*3/uL (ref 1.7–7.7)
Neutrophils Relative %: 72 %
Platelet Count: 232 10*3/uL (ref 150–400)
RBC: 5.26 MIL/uL (ref 4.22–5.81)
RDW: 17.1 % — ABNORMAL HIGH (ref 11.5–15.5)
WBC Count: 8.1 10*3/uL (ref 4.0–10.5)
nRBC: 0 % (ref 0.0–0.2)

## 2021-01-20 LAB — CMP (CANCER CENTER ONLY)
ALT: 17 U/L (ref 0–44)
AST: 18 U/L (ref 15–41)
Albumin: 3.1 g/dL — ABNORMAL LOW (ref 3.5–5.0)
Alkaline Phosphatase: 80 U/L (ref 38–126)
Anion gap: 5 (ref 5–15)
BUN: 19 mg/dL (ref 8–23)
CO2: 30 mmol/L (ref 22–32)
Calcium: 9 mg/dL (ref 8.9–10.3)
Chloride: 101 mmol/L (ref 98–111)
Creatinine: 1.25 mg/dL — ABNORMAL HIGH (ref 0.61–1.24)
GFR, Estimated: 58 mL/min — ABNORMAL LOW (ref >60)
Glucose, Bld: 93 mg/dL (ref 70–99)
Potassium: 4.7 mmol/L (ref 3.5–5.1)
Sodium: 136 mmol/L (ref 135–145)
Total Bilirubin: 0.5 mg/dL (ref 0.3–1.2)
Total Protein: 7.6 g/dL (ref 6.5–8.1)

## 2021-02-02 ENCOUNTER — Other Ambulatory Visit (HOSPITAL_BASED_OUTPATIENT_CLINIC_OR_DEPARTMENT_OTHER): Payer: Self-pay

## 2021-02-02 ENCOUNTER — Ambulatory Visit: Payer: Medicare Other | Attending: Internal Medicine

## 2021-02-02 ENCOUNTER — Other Ambulatory Visit: Payer: Self-pay

## 2021-02-02 ENCOUNTER — Encounter: Payer: Self-pay | Admitting: Nurse Practitioner

## 2021-02-02 DIAGNOSIS — Z23 Encounter for immunization: Secondary | ICD-10-CM

## 2021-02-02 MED ORDER — PFIZER-BIONT COVID-19 VAC-TRIS 30 MCG/0.3ML IM SUSP
INTRAMUSCULAR | 0 refills | Status: AC
  Filled 2021-02-02: qty 0.3, 1d supply, fill #0

## 2021-02-02 NOTE — Progress Notes (Signed)
   Covid-19 Vaccination Clinic  Name:  Aaron Hawkins    MRN: [id number] DOB: Aug 02, 1940  02/02/2021  Mr. [name] was observed post Covid-19 immunization for 15 minutes without incident. He was provided with Vaccine Information Sheet and instruction to access the V-Safe system.   Mr. [name] was instructed to call 911 with any severe reactions post vaccine: Difficulty breathing  Swelling of face and throat  A fast heartbeat  A bad rash all over body  Dizziness and weakness   Immunizations Administered     Name Date Dose VIS Date Route   PFIZER Comrnaty(Gray TOP) Covid-19 Vaccine 02/02/2021 12:48 PM 0.3 mL 06/25/2020 Intramuscular   Manufacturer: [employer]   Lot: [id number]   [place]: [phone]

## 2021-02-03 ENCOUNTER — Inpatient Hospital Stay (HOSPITAL_COMMUNITY)
Admission: EM | Admit: 2021-02-03 | Discharge: 2021-02-15 | DRG: 268 | Disposition: E | Payer: Medicare Other | Attending: Vascular Surgery | Admitting: Vascular Surgery

## 2021-02-03 ENCOUNTER — Other Ambulatory Visit: Payer: Self-pay

## 2021-02-03 ENCOUNTER — Encounter (HOSPITAL_COMMUNITY): Payer: Self-pay | Admitting: Emergency Medicine

## 2021-02-03 ENCOUNTER — Emergency Department (HOSPITAL_COMMUNITY): Payer: Medicare Other

## 2021-02-03 DIAGNOSIS — Z8546 Personal history of malignant neoplasm of prostate: Secondary | ICD-10-CM

## 2021-02-03 DIAGNOSIS — Z96641 Presence of right artificial hip joint: Secondary | ICD-10-CM | POA: Diagnosis present

## 2021-02-03 DIAGNOSIS — Z888 Allergy status to other drugs, medicaments and biological substances status: Secondary | ICD-10-CM

## 2021-02-03 DIAGNOSIS — I2510 Atherosclerotic heart disease of native coronary artery without angina pectoris: Secondary | ICD-10-CM | POA: Diagnosis present

## 2021-02-03 DIAGNOSIS — Z8711 Personal history of peptic ulcer disease: Secondary | ICD-10-CM

## 2021-02-03 DIAGNOSIS — Z79899 Other long term (current) drug therapy: Secondary | ICD-10-CM | POA: Diagnosis not present

## 2021-02-03 DIAGNOSIS — G610 Guillain-Barre syndrome: Secondary | ICD-10-CM | POA: Diagnosis present

## 2021-02-03 DIAGNOSIS — E7800 Pure hypercholesterolemia, unspecified: Secondary | ICD-10-CM | POA: Diagnosis present

## 2021-02-03 DIAGNOSIS — F17210 Nicotine dependence, cigarettes, uncomplicated: Secondary | ICD-10-CM | POA: Diagnosis present

## 2021-02-03 DIAGNOSIS — M79604 Pain in right leg: Secondary | ICD-10-CM

## 2021-02-03 DIAGNOSIS — Z8042 Family history of malignant neoplasm of prostate: Secondary | ICD-10-CM

## 2021-02-03 DIAGNOSIS — Z20822 Contact with and (suspected) exposure to covid-19: Secondary | ICD-10-CM | POA: Diagnosis present

## 2021-02-03 DIAGNOSIS — Z85820 Personal history of malignant melanoma of skin: Secondary | ICD-10-CM | POA: Diagnosis not present

## 2021-02-03 DIAGNOSIS — Z96611 Presence of right artificial shoulder joint: Secondary | ICD-10-CM | POA: Diagnosis present

## 2021-02-03 DIAGNOSIS — I772 Rupture of artery: Secondary | ICD-10-CM | POA: Diagnosis present

## 2021-02-03 DIAGNOSIS — R0902 Hypoxemia: Secondary | ICD-10-CM | POA: Diagnosis not present

## 2021-02-03 DIAGNOSIS — Z9049 Acquired absence of other specified parts of digestive tract: Secondary | ICD-10-CM

## 2021-02-03 DIAGNOSIS — S35514A Injury of right iliac vein, initial encounter: Secondary | ICD-10-CM | POA: Diagnosis not present

## 2021-02-03 DIAGNOSIS — K429 Umbilical hernia without obstruction or gangrene: Secondary | ICD-10-CM | POA: Diagnosis not present

## 2021-02-03 DIAGNOSIS — I714 Abdominal aortic aneurysm, without rupture, unspecified: Secondary | ICD-10-CM | POA: Diagnosis present

## 2021-02-03 DIAGNOSIS — E611 Iron deficiency: Secondary | ICD-10-CM | POA: Diagnosis present

## 2021-02-03 DIAGNOSIS — K660 Peritoneal adhesions (postprocedural) (postinfection): Secondary | ICD-10-CM | POA: Diagnosis present

## 2021-02-03 DIAGNOSIS — D509 Iron deficiency anemia, unspecified: Secondary | ICD-10-CM | POA: Diagnosis not present

## 2021-02-03 DIAGNOSIS — I723 Aneurysm of iliac artery: Secondary | ICD-10-CM | POA: Diagnosis not present

## 2021-02-03 DIAGNOSIS — R509 Fever, unspecified: Secondary | ICD-10-CM | POA: Diagnosis not present

## 2021-02-03 DIAGNOSIS — I491 Atrial premature depolarization: Secondary | ICD-10-CM | POA: Diagnosis not present

## 2021-02-03 DIAGNOSIS — Z8619 Personal history of other infectious and parasitic diseases: Secondary | ICD-10-CM

## 2021-02-03 DIAGNOSIS — I724 Aneurysm of artery of lower extremity: Secondary | ICD-10-CM | POA: Diagnosis not present

## 2021-02-03 DIAGNOSIS — Z803 Family history of malignant neoplasm of breast: Secondary | ICD-10-CM | POA: Diagnosis not present

## 2021-02-03 DIAGNOSIS — T827XXA Infection and inflammatory reaction due to other cardiac and vascular devices, implants and grafts, initial encounter: Secondary | ICD-10-CM | POA: Diagnosis present

## 2021-02-03 DIAGNOSIS — A419 Sepsis, unspecified organism: Secondary | ICD-10-CM

## 2021-02-03 DIAGNOSIS — I517 Cardiomegaly: Secondary | ICD-10-CM | POA: Diagnosis not present

## 2021-02-03 DIAGNOSIS — R000 Tachycardia, unspecified: Secondary | ICD-10-CM | POA: Diagnosis not present

## 2021-02-03 DIAGNOSIS — Z87442 Personal history of urinary calculi: Secondary | ICD-10-CM

## 2021-02-03 DIAGNOSIS — R296 Repeated falls: Secondary | ICD-10-CM | POA: Diagnosis present

## 2021-02-03 DIAGNOSIS — Y832 Surgical operation with anastomosis, bypass or graft as the cause of abnormal reaction of the patient, or of later complication, without mention of misadventure at the time of the procedure: Secondary | ICD-10-CM | POA: Diagnosis not present

## 2021-02-03 DIAGNOSIS — N281 Cyst of kidney, acquired: Secondary | ICD-10-CM | POA: Diagnosis not present

## 2021-02-03 DIAGNOSIS — R0689 Other abnormalities of breathing: Secondary | ICD-10-CM | POA: Diagnosis not present

## 2021-02-03 DIAGNOSIS — Z887 Allergy status to serum and vaccine status: Secondary | ICD-10-CM | POA: Diagnosis not present

## 2021-02-03 DIAGNOSIS — T82898A Other specified complication of vascular prosthetic devices, implants and grafts, initial encounter: Secondary | ICD-10-CM | POA: Diagnosis not present

## 2021-02-03 LAB — CBC WITH DIFFERENTIAL/PLATELET
Abs Immature Granulocytes: 0.02 10*3/uL (ref 0.00–0.07)
Basophils Absolute: 0 10*3/uL (ref 0.0–0.1)
Basophils Relative: 0 %
Eosinophils Absolute: 0 10*3/uL (ref 0.0–0.5)
Eosinophils Relative: 0 %
HCT: 41 % (ref 39.0–52.0)
Hemoglobin: 12.9 g/dL — ABNORMAL LOW (ref 13.0–17.0)
Immature Granulocytes: 0 %
Lymphocytes Relative: 6 %
Lymphs Abs: 0.6 10*3/uL — ABNORMAL LOW (ref 0.7–4.0)
MCH: 25.8 pg — ABNORMAL LOW (ref 26.0–34.0)
MCHC: 31.5 g/dL (ref 30.0–36.0)
MCV: 82 fL (ref 80.0–100.0)
Monocytes Absolute: 0.7 10*3/uL (ref 0.1–1.0)
Monocytes Relative: 7 %
Neutro Abs: 8.9 10*3/uL — ABNORMAL HIGH (ref 1.7–7.7)
Neutrophils Relative %: 87 %
Platelets: 197 10*3/uL (ref 150–400)
RBC: 5 MIL/uL (ref 4.22–5.81)
RDW: 18.4 % — ABNORMAL HIGH (ref 11.5–15.5)
WBC: 10.2 10*3/uL (ref 4.0–10.5)
nRBC: 0 % (ref 0.0–0.2)

## 2021-02-03 LAB — RESP PANEL BY RT-PCR (FLU A&B, COVID) ARPGX2
Influenza A by PCR: NEGATIVE
Influenza B by PCR: NEGATIVE
SARS Coronavirus 2 by RT PCR: NEGATIVE

## 2021-02-03 LAB — COMPREHENSIVE METABOLIC PANEL
ALT: 14 U/L (ref 0–44)
ALT: 12 U/L (ref 0–44)
AST: 20 U/L (ref 15–41)
AST: 20 U/L (ref 15–41)
Albumin: 3.1 g/dL — ABNORMAL LOW (ref 3.5–5.0)
Albumin: 2.5 g/dL — ABNORMAL LOW (ref 3.5–5.0)
Alkaline Phosphatase: 62 U/L (ref 38–126)
Alkaline Phosphatase: 56 U/L (ref 38–126)
Anion gap: 10 (ref 5–15)
Anion gap: 7 (ref 5–15)
BUN: 16 mg/dL (ref 8–23)
BUN: 13 mg/dL (ref 8–23)
CO2: 26 mmol/L (ref 22–32)
CO2: 26 mmol/L (ref 22–32)
Calcium: 8.5 mg/dL — ABNORMAL LOW (ref 8.9–10.3)
Calcium: 7.8 mg/dL — ABNORMAL LOW (ref 8.9–10.3)
Chloride: 99 mmol/L (ref 98–111)
Chloride: 99 mmol/L (ref 98–111)
Creatinine, Ser: 0.91 mg/dL (ref 0.61–1.24)
Creatinine, Ser: 0.95 mg/dL (ref 0.61–1.24)
GFR, Estimated: >60 mL/min (ref >60)
GFR, Estimated: >60 mL/min (ref >60)
Glucose, Bld: 140 mg/dL — ABNORMAL HIGH (ref 70–99)
Glucose, Bld: 106 mg/dL — ABNORMAL HIGH (ref 70–99)
Potassium: 4 mmol/L (ref 3.5–5.1)
Potassium: 4.1 mmol/L (ref 3.5–5.1)
Sodium: 135 mmol/L (ref 135–145)
Sodium: 132 mmol/L — ABNORMAL LOW (ref 135–145)
Total Bilirubin: 0.8 mg/dL (ref 0.3–1.2)
Total Bilirubin: 1 mg/dL (ref 0.3–1.2)
Total Protein: 7 g/dL (ref 6.5–8.1)
Total Protein: 6 g/dL — ABNORMAL LOW (ref 6.5–8.1)

## 2021-02-03 LAB — PROTIME-INR
INR: 1.1 (ref 0.8–1.2)
Prothrombin Time: 14.6 seconds (ref 11.4–15.2)

## 2021-02-03 LAB — CBC
HCT: 40.3 % (ref 39.0–52.0)
Hemoglobin: 12.6 g/dL — ABNORMAL LOW (ref 13.0–17.0)
MCH: 26 pg (ref 26.0–34.0)
MCHC: 31.3 g/dL (ref 30.0–36.0)
MCV: 83.3 fL (ref 80.0–100.0)
Platelets: 182 10*3/uL (ref 150–400)
RBC: 4.84 MIL/uL (ref 4.22–5.81)
RDW: 18.2 % — ABNORMAL HIGH (ref 11.5–15.5)
WBC: 13.1 10*3/uL — ABNORMAL HIGH (ref 4.0–10.5)
nRBC: 0 % (ref 0.0–0.2)

## 2021-02-03 LAB — URINALYSIS, ROUTINE W REFLEX MICROSCOPIC
Bilirubin Urine: NEGATIVE
Glucose, UA: NEGATIVE mg/dL
Hgb urine dipstick: NEGATIVE
Ketones, ur: NEGATIVE mg/dL
Leukocytes,Ua: NEGATIVE
Nitrite: NEGATIVE
Protein, ur: NEGATIVE mg/dL
Specific Gravity, Urine: 1.02 (ref 1.005–1.030)
pH: 6 (ref 5.0–8.0)

## 2021-02-03 LAB — LACTIC ACID, PLASMA
Lactic Acid, Venous: 2.2 mmol/L (ref 0.5–1.9)
Lactic Acid, Venous: 1.3 mmol/L (ref 0.5–1.9)

## 2021-02-03 LAB — APTT: aPTT: 31 seconds (ref 24–36)

## 2021-02-03 MED ORDER — SODIUM CHLORIDE 0.9 % IV SOLN: INTRAVENOUS | Status: DC

## 2021-02-03 MED ORDER — PHENOL 1.4 % MT LIQD
1.0000 | OROMUCOSAL | Status: DC | PRN
  Filled 2021-02-03: qty 177

## 2021-02-03 MED ORDER — HEPARIN SODIUM (PORCINE) 5000 UNIT/ML IJ SOLN
5000.0000 [IU] | Freq: Three times a day (TID) | INTRAMUSCULAR | Status: DC
  Administered 2021-02-03 – 2021-02-04 (×2): 5000 [IU] via SUBCUTANEOUS
  Filled 2021-02-03 (×2): qty 1

## 2021-02-03 MED ORDER — LACTATED RINGERS IV BOLUS (SEPSIS)
1000.0000 mL | Freq: Once | INTRAVENOUS | Status: AC
  Administered 2021-02-03: 1000 mL via INTRAVENOUS

## 2021-02-03 MED ORDER — POTASSIUM CHLORIDE CRYS ER 20 MEQ PO TBCR
20.0000 meq | EXTENDED_RELEASE_TABLET | Freq: Once | ORAL | Status: AC
  Administered 2021-02-03: 20 meq via ORAL
  Filled 2021-02-03: qty 1

## 2021-02-03 MED ORDER — GABAPENTIN 300 MG PO CAPS
300.0000 mg | ORAL_CAPSULE | Freq: Every day | ORAL | Status: DC
  Administered 2021-02-03: 300 mg via ORAL
  Filled 2021-02-03: qty 1

## 2021-02-03 MED ORDER — ACETAMINOPHEN 500 MG PO TABS
1000.0000 mg | ORAL_TABLET | Freq: Once | ORAL | Status: AC
  Administered 2021-02-03: 1000 mg via ORAL
  Filled 2021-02-03: qty 2

## 2021-02-03 MED ORDER — GUAIFENESIN-DM 100-10 MG/5ML PO SYRP: 15.0000 mL | ORAL_SOLUTION | ORAL | Status: DC | PRN

## 2021-02-03 MED ORDER — LABETALOL HCL 5 MG/ML IV SOLN
10.0000 mg | INTRAVENOUS | Status: DC | PRN
Start: 2021-02-03 — End: 2021-02-05

## 2021-02-03 MED ORDER — ALUM & MAG HYDROXIDE-SIMETH 200-200-20 MG/5ML PO SUSP: 15.0000 mL | ORAL | Status: DC | PRN

## 2021-02-03 MED ORDER — VANCOMYCIN HCL IN DEXTROSE 1-5 GM/200ML-% IV SOLN: 1000.0000 mg | Freq: Once | INTRAVENOUS | Status: DC

## 2021-02-03 MED ORDER — SODIUM CHLORIDE 0.9 % IV SOLN: 2.0000 g | Freq: Two times a day (BID) | INTRAVENOUS | Status: DC

## 2021-02-03 MED ORDER — HYDRALAZINE HCL 20 MG/ML IJ SOLN: 5.0000 mg | INTRAMUSCULAR | Status: DC | PRN

## 2021-02-03 MED ORDER — LACTATED RINGERS IV BOLUS (SEPSIS)
250.0000 mL | Freq: Once | INTRAVENOUS | Status: AC
  Administered 2021-02-03: 250 mL via INTRAVENOUS

## 2021-02-03 MED ORDER — VANCOMYCIN HCL 1250 MG/250ML IV SOLN
1250.0000 mg | INTRAVENOUS | Status: DC
  Administered 2021-02-04: 1250 mg via INTRAVENOUS
  Filled 2021-02-03: qty 250

## 2021-02-03 MED ORDER — VANCOMYCIN HCL 1500 MG/300ML IV SOLN
1500.0000 mg | Freq: Once | INTRAVENOUS | Status: AC
  Administered 2021-02-03: 1500 mg via INTRAVENOUS
  Filled 2021-02-03: qty 300

## 2021-02-03 MED ORDER — PIPERACILLIN-TAZOBACTAM 3.375 G IVPB
3.3750 g | Freq: Three times a day (TID) | INTRAVENOUS | Status: DC
  Administered 2021-02-03 – 2021-02-04 (×4): 3.375 g via INTRAVENOUS
  Filled 2021-02-03 (×4): qty 50

## 2021-02-03 MED ORDER — IOHEXOL 350 MG/ML SOLN
100.0000 mL | Freq: Once | INTRAVENOUS | Status: AC | PRN
  Administered 2021-02-03: 80 mL via INTRAVENOUS

## 2021-02-03 MED ORDER — SODIUM CHLORIDE (PF) 0.9 % IJ SOLN
INTRAMUSCULAR | Status: AC
  Filled 2021-02-03: qty 50

## 2021-02-03 MED ORDER — ROSUVASTATIN CALCIUM 5 MG PO TABS
10.0000 mg | ORAL_TABLET | Freq: Every day | ORAL | Status: DC
  Administered 2021-02-03: 10 mg via ORAL
  Filled 2021-02-03: qty 2

## 2021-02-03 MED ORDER — METOPROLOL TARTRATE 5 MG/5ML IV SOLN: 2.0000 mg | INTRAVENOUS | Status: DC | PRN

## 2021-02-03 MED ORDER — PANTOPRAZOLE SODIUM 40 MG PO TBEC
40.0000 mg | DELAYED_RELEASE_TABLET | Freq: Every day | ORAL | Status: DC
  Administered 2021-02-03: 40 mg via ORAL
  Filled 2021-02-03: qty 1

## 2021-02-03 MED ORDER — SODIUM CHLORIDE 0.9 % IV SOLN
2.0000 g | Freq: Once | INTRAVENOUS | Status: AC
  Administered 2021-02-03: 2 g via INTRAVENOUS
  Filled 2021-02-03: qty 2

## 2021-02-03 MED ORDER — OXYCODONE HCL 5 MG PO TABS
5.0000 mg | ORAL_TABLET | ORAL | Status: DC | PRN
  Administered 2021-02-03 – 2021-02-04 (×2): 5 mg via ORAL
  Filled 2021-02-03 (×2): qty 1

## 2021-02-03 MED ORDER — ONDANSETRON HCL 4 MG/2ML IJ SOLN: 4.0000 mg | Freq: Four times a day (QID) | INTRAMUSCULAR | Status: DC | PRN

## 2021-02-03 MED ORDER — MORPHINE SULFATE (PF) 2 MG/ML IV SOLN
2.0000 mg | INTRAVENOUS | Status: DC | PRN
  Administered 2021-02-04: 4 mg via INTRAVENOUS
  Administered 2021-02-04: 2 mg via INTRAVENOUS
  Administered 2021-02-04: 4 mg via INTRAVENOUS
  Filled 2021-02-03: qty 1
  Filled 2021-02-03 (×2): qty 2

## 2021-02-03 MED ORDER — LACTATED RINGERS IV SOLN: INTRAVENOUS | Status: AC

## 2021-02-03 NOTE — ED Provider Notes (Signed)
  Physical Exam  BP 118/61   Pulse 78   Temp 98.9 F (37.2 C) (Oral)   Resp 16   Ht 5\' 7"  (1.702 m)   Wt 70.3 kg   SpO2 96%   BMI 24.28 kg/m   Physical Exam  ED Course/Procedures     Procedures  MDM  Patient transferred from [facility] for infected graft.  Patient hemodynamically stable on arrival.  Paged Dr. Donzetta Matters to see patient.        Drenda Freeze, MD 01/29/2021 [phone]

## 2021-02-03 NOTE — ED Notes (Signed)
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

## 2021-02-03 NOTE — H&P (Signed)
H+P    Reason for Consult:  infected EVAR   Referring Physician:  Dr. Zenia Resides MRN #:  [id number]  History of Present Illness: 80 y.o. male history of endovascular aneurysm repair performed July 2021.  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  Ultimately patient was deemed to not have aortic infection.  He states that he now returns to the ER for fever and right groin pain.  Fever started yesterday.  Right groin pain throughout the night last night.  Minimal back and abdominal pain.  He continues to eat without limitation.  No further weight loss.  He is having normal stools no blood.  Past Medical History:  Diagnosis Date   AAA (abdominal aortic aneurysm) ([place])    Anemia    Arthritis    "probably in my knees and right shoulder" (11/22/2017)   Coronary artery calcification 03/31/2020   Duodenal ulcer 1964   Family history of breast cancer    Family history of prostate cancer    Frequent falls    History of Guillain-Barre syndrome 2003   History of hiatal hernia    per patient about 20 years ago   History of hydronephrosis    W/ STRICTURE - not aware of this   History of melanoma    History of melanoma excision    BACK-- 04-04-2007 W/ SLN BX RIGHT AXILL, right arm 2017   History of nephrolithiasis    right kidney   History of shingles 2020   Iron deficiency    Microscopic hematuria    history of   Post herpetic neuralgia    Prostate cancer ([place]) 05/30/13   gleason 3+3=6, volume 58 cc   Pure hypercholesterolemia 03/31/2020   Skin cancer 04/04/2007; 2017   "back, right arm"    Past Surgical History:  Procedure Laterality Date   ABDOMINAL AORTIC ENDOVASCULAR STENT GRAFT N/A 01/22/2020   Procedure: ABDOMINAL AORTIC ENDOVASCULAR STENT GRAFT repair;  Surgeon: [name], MD;  Location: [facility];  Service: Vascular;  Laterality: N/A;    CATARACT EXTRACTION W/ INTRAOCULAR LENS  IMPLANT, BILATERAL Bilateral    COLON SURGERY     removed intestine  colon ca   HERNIA REPAIR  5643'P   w/mesh, umbilical   INGUINAL HERNIA REPAIR Bilateral 1990's   JOINT REPLACEMENT     MELANOMA EXCISION  04/04/2007; 2017   back W/ SLN BX RIGHT AXILL; right arm   PROSTATE BIOPSY  01/02/13   benign, one small focus of atypical cells right core   PROSTATE BIOPSY  05/30/13   Gleason 3+3=6, volume 49 cc   RADIOACTIVE SEED IMPLANT N/A 08/08/2013   Procedure: RADIOACTIVE SEED IMPLANT;  Surgeon: Molli Hazard, MD;  Location: [facility];  Service: Urology;  Laterality: N/A;   79 SEED IMPLANTED NO SEEDS FOUND IN BLADDER   TONSILLECTOMY AND ADENOIDECTOMY  childhood   TOTAL HIP ARTHROPLASTY Right 01-06-2009   TOTAL SHOULDER ARTHROPLASTY Right 11/22/2017   TOTAL SHOULDER ARTHROPLASTY Right 11/22/2017   Procedure: TOTAL SHOULDER ARTHROPLASTY;  Surgeon: Hiram Gash, MD;  Location: [facility];  Service: Orthopedics;  Laterality: Right;   ULTRASOUND GUIDANCE FOR VASCULAR ACCESS Bilateral 01/22/2020   Procedure: ULTRASOUND GUIDANCE FOR VASCULAR ACCESS, bilateral femoral arteries;  Surgeon: [name], MD;  Location: [facility];  Service: Vascular;  Laterality: Bilateral;    Allergies  Allergen Reactions  Fluzone Quadrivalent [Influenza Vac Split Quad] Other (See Comments)    GBS    Prior to Admission medications   Medication Sig Start Date End Date Taking? Authorizing Provider  acetaminophen (TYLENOL) 500 MG tablet Take 500 mg by mouth every 6 (six) hours as needed for moderate pain.     [provider]  COVID-19 mRNA Vac-TriS, Pfizer, (PFIZER-BIONT COVID-19 VAC-TRIS) SUSP injection Inject into the muscle. 02/02/21   Carlyle Basques, MD  gabapentin (NEURONTIN) 300 MG capsule Take 300 mg by mouth at bedtime.    [provider]  Multiple Vitamins-Minerals (PRESERVISION AREDS 2) CAPS Take 1 capsule by mouth daily.     [provider]  Pediatric Multivitamins-Iron (CENTRUM JR/IRON PO) Take 1 tablet by mouth.    [provider]  rosuvastatin (CRESTOR) 10 MG tablet Take 1 tablet (10 mg total) by mouth daily. 01/23/20   Rhyne, Hulen Shouts, PA-C    Social History   Socioeconomic History   Marital status: Married    Spouse name: Not on file   Number of children: 3   Years of education: Not on file   Highest education level: Not on file  Occupational History   Occupation: Retired     Comment: Regulatory affairs officer  Tobacco Use   Smoking status: Former    Packs/day: 1.00    Years: 7.00    Pack years: 7.00    Types: Cigarettes    Quit date: 06/18/1966    Years since quitting: 48.6   Smokeless tobacco: Never  Vaping Use   Vaping Use: Never used  Substance and Sexual Activity   Alcohol use: Yes    Alcohol/week: 2.0 standard drinks    Types: 2 Glasses of wine per week    Comment: occ   Drug use: No   Sexual activity: Not Currently  Other Topics Concern   Not on file  Social History Narrative   Lives with spouse    Drinks 1 cup of caffeine daily/occassionally   Right handed   Social Determinants of Health   Financial Resource Strain: Not on file  Food Insecurity: Not on file  Transportation Needs: Not on file  Physical Activity: Not on file  Stress: Not on file  Social Connections: Not on file  Intimate Partner Violence: Not on file    Family History  Problem Relation Age of Onset   Prostate cancer Father        dx. in his 43s   Prostate cancer Brother        dx. in his mid-60s   Breast cancer Sister 4       dx. in her 21s   Dementia Mother    Cancer Maternal Uncle        dx. in his 25s; unknown primary   Cancer Maternal Uncle        dx. in his 76s; unknown primary   Cancer Cousin        dx. >50; blood cancer    ROS: Cardiovascular: []  chest pain/pressure []  palpitations []  SOB lying flat []  DOE []  pain in legs while walking []  pain in legs at rest []  pain in legs at  night []  non-healing ulcers []  hx of DVT []  swelling in legs  Pulmonary: []  productive cough []  asthma/wheezing []  home O2  Neurologic: []  weakness in []  arms []  legs []  numbness in []  arms []  legs []  hx of CVA []  mini stroke [] difficulty speaking or slurred speech []  temporary loss of vision in  one eye []  dizziness  Hematologic: [x]  hx of cancer []  bleeding problems []  problems with blood clotting easily  Endocrine:   []  diabetes []  thyroid disease  GI []  vomiting blood []  blood in stool  GU: []  CKD/renal failure []  HD--[]  M/W/F or []  T/T/S []  burning with urination []  blood in urine  Psychiatric: []  anxiety []  depression  Musculoskeletal: []  arthritis []  joint pain  Integumentary: []  rashes []  ulcers  Constitutional: [x]  fever []  chills   Physical Examination  Vitals:   01/23/2021 1605 02/01/2021 1606  BP: 95/61   Pulse: 80 78  Resp: (!) 23 13  Temp:  98.9 F (37.2 C)  SpO2: 93% 93%   Body mass index is 24.28 kg/m.  General:  nad HENT: WNL, normocephalic Pulmonary: normal non-labored breathing Cardiac: Palpable femoral, popliteal, pedal pulses bilaterally Abdomen: Soft, mild distention, mild tenderness to palpation Extremities: Significant tenderness to palpation right groin, both lower extremities are warm and well-perfused Musculoskeletal: no muscle wasting or atrophy Neurologic: A&O X 3; Appropriate Affect ; SENSATION: normal; MOTOR FUNCTION:  moving all extremities equally. Speech is fluent/normal   CBC    Component Value Date/Time   WBC 10.2 02/02/2021 1300   RBC 5.00 01/24/2021 1300   HGB 12.9 (L) 01/19/2021 1300   HGB 13.4 01/20/2021 1038   HGB 16.9 03/30/2011 1349   HCT 41.0 01/29/2021 1300   HCT 48.6 03/30/2011 1349   PLT 197 02/11/2021 1300   PLT 232 01/20/2021 1038   PLT 167 03/30/2011 1349   MCV 82.0 02/09/2021 1300   MCV 91.2 03/30/2011 1349   MCH 25.8 (L) 02/11/2021 1300   MCHC 31.5 01/18/2021 1300   RDW 18.4 (H)  02/09/2021 1300   RDW 13.8 03/30/2011 1349   LYMPHSABS 0.6 (L) 01/30/2021 1300   LYMPHSABS 1.9 03/30/2011 1349   MONOABS 0.7 01/25/2021 1300   MONOABS 0.5 03/30/2011 1349   EOSABS 0.0 01/19/2021 1300   EOSABS 0.1 03/30/2011 1349   BASOSABS 0.0 02/08/2021 1300   BASOSABS 0.0 03/30/2011 1349    BMET    Component Value Date/Time   NA 135 02/06/2021 1300   K 4.0 02/07/2021 1300   CL 99 01/15/2021 1300   CO2 26 02/12/2021 1300   GLUCOSE 140 (H) 01/31/2021 1300   BUN 16 02/06/2021 1300   CREATININE 0.91 02/09/2021 1300   CREATININE 1.25 (H) 01/20/2021 1038   CALCIUM 8.5 (L) 02/02/2021 1300   GFRNONAA >60 01/23/2021 1300   GFRNONAA 58 (L) 01/20/2021 1038   GFRAA >60 04/07/2020 1132   GFRAA >60 03/20/2020 0930    COAGS: Lab Results  Component Value Date   INR 1.1 02/02/2021   INR 1.2 01/22/2020   INR 1.1 01/14/2020     Non-Invasive Vascular Imaging:   CTA IMPRESSION: 1. Post aorto bi-iliac stent graft repair of abdominal aortic aneurysm. There is air within the aneurysm sac that was not seen on prior exams. Additionally, indistinct fat planes adjacent to the abdominal aorta with hazy stranding extending from the diaphragmatic hiatus to the iliac bifurcation. Graft infection is considered. Consider nuclear medicine white blood cell scan for further assessment. There is no evidence of anterior or aortic fistula after explanation of air in the graft. 2. Infrarenal aortic aneurysm sac is diminished in size from prior exam. The previous type 2 endoleak is not definitively seen on this exam, possibly due to phase of contrast. 3. Colonic diverticulosis without diverticulitis.   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  I  discussed with patient the grave nature of this disease process.  I discussed with him the options being palliative care versus graft explantation with either extra-anatomic or inline flow reconstruction.  I discussed that I favor  extra-anatomic given the patient's anatomy with previous endograft.  I discussed the risk of the operation being death, prolonged intubation, myocardial infarction, renal failure up to and including permanent dialysis, lower extremity ischemia.  We will start the patient on broad-spectrum antibiotics and perform COVID-19 nasal swab tonight.  He will be n.p.o. past midnight with plan for axillary bifemoral bypass grafting and aortic explantation with aortic oversewing tomorrow in the OR.  All of his questions were answered.  Kaelee Pfeffer C. Donzetta Matters, MD [facility] Office: [phone] Pager: [phone]

## 2021-02-03 NOTE — Sepsis Progress Note (Signed)
[facility] is monitoring this code sepsis 

## 2021-02-03 NOTE — Progress Notes (Signed)
A consult was received from an ED physician for Vancomycin & Cefepime per pharmacy dosing.  The patient's profile has been reviewed for ht/wt/allergies/indication/available labs.   A one time order has been placed for Cefepime 2gm & Vancomycin 1gm IV.  Further antibiotics/pharmacy consults should be ordered by admitting physician if indicated.                       Thank you, Netta Cedars PharmD, BCPS 02/12/2021  4:13 PM

## 2021-02-03 NOTE — ED Triage Notes (Signed)
BIBA Per EMS: Pt coming from home with complaints of a fever of 102  today. Pt did get Covid booster yesterday. Pt also stopped antibiotic for a UTI on Monday. Pt received 500mg  tylenol at 10am. 500mg  tylenol given en route. 20G L AC; 500ML fluids given

## 2021-02-03 NOTE — ED Provider Notes (Signed)
[facility] DEPT Provider Note   CSN: [id number] Arrival date & time: 01/15/2021  1301     History Chief Complaint  Patient presents with   Fever    Aaron Hawkins is a 80 y.o. male.  HPI Patient reports he has having severe pain in his right hip and groin region.  It seems to start quite abruptly during the early hours of the morning.  Patient reports he got his COVID booster yesterday.  He was feeling pretty well.  He reports the early hours this morning he tried to get up to go to the bathroom and was in such severe pain in his hips and groin area as well as into the legs that he could not really even walk or get up.  He reports with some assistance from family he has been able to move around the house a little bit.  He developed a high fever.  He reports he feels generally very weak and achy.  He did not develop any vomiting or diarrhea.  Patient has been on almost a months worth of Bactrim for apparently resistant urinary tract infection.  He has not seen urology during the course of this treatment.  He reports he does have problems with urinary incontinence and urgency.  Patient denies has been having problems with productive cough or chest pain.  No headache.    Past Medical History:  Diagnosis Date   AAA (abdominal aortic aneurysm) ([place])    Anemia    Arthritis    "probably in my knees and right shoulder" (11/22/2017)   Coronary artery calcification 03/31/2020   Duodenal ulcer 1964   Family history of breast cancer    Family history of prostate cancer    Frequent falls    History of Guillain-Barre syndrome 2003   History of hiatal hernia    per patient about 20 years ago   History of hydronephrosis    W/ STRICTURE - not aware of this   History of melanoma    History of melanoma excision    BACK-- 04-04-2007 W/ SLN BX RIGHT AXILL, right arm 2017   History of nephrolithiasis    right kidney   History of shingles 2020   Iron deficiency     Microscopic hematuria    history of   Post herpetic neuralgia    Prostate cancer ([place]) 05/30/13   gleason 3+3=6, volume 58 cc   Pure hypercholesterolemia 03/31/2020   Skin cancer 04/04/2007; 2017   "back, right arm"    Patient Active Problem List   Diagnosis Date Noted   Abdominal pain 07/20/2020   Coronary artery calcification 03/31/2020   Pure hypercholesterolemia 03/31/2020   Genetic testing 01/24/2020   AAA (abdominal aortic aneurysm) ([place]) 01/22/2020   History of melanoma    Family history of prostate cancer    Family history of breast cancer    Iron deficiency anemia 12/20/2019   AAA (abdominal aortic aneurysm) without rupture ([place]) 12/06/2019   History of nephrolithiasis    History of hiatal hernia    Arthritis    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 12/04/2019   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   Prostate cancer ([place])    History of Guillain-Barre syndrome 2003   Duodenal ulcer 1964    Past Surgical History:  Procedure Laterality Date   ABDOMINAL AORTIC ENDOVASCULAR STENT GRAFT N/A 01/22/2020   Procedure: ABDOMINAL AORTIC ENDOVASCULAR STENT GRAFT repair;  Surgeon: [name], MD;  Location: [facility] OR;  Service: Vascular;  Laterality: N/A;   CATARACT EXTRACTION W/ INTRAOCULAR LENS  IMPLANT, BILATERAL Bilateral    COLON SURGERY     removed intestine  colon ca   HERNIA REPAIR  3299'M   w/mesh, umbilical   INGUINAL HERNIA REPAIR Bilateral 1990's   JOINT REPLACEMENT     MELANOMA EXCISION  04/04/2007; 2017   back W/ SLN BX RIGHT AXILL; right arm   PROSTATE BIOPSY  01/02/13   benign, one small focus of atypical cells right core   PROSTATE BIOPSY  05/30/13   Gleason 3+3=6, volume 49 cc   RADIOACTIVE SEED IMPLANT N/A 08/08/2013   Procedure: RADIOACTIVE SEED IMPLANT;  Surgeon: Molli Hazard, MD;  Location: [facility];  Service: Urology;  Laterality: N/A;   79 SEED IMPLANTED NO SEEDS FOUND IN BLADDER    TONSILLECTOMY AND ADENOIDECTOMY  childhood   TOTAL HIP ARTHROPLASTY Right 01-06-2009   TOTAL SHOULDER ARTHROPLASTY Right 11/22/2017   TOTAL SHOULDER ARTHROPLASTY Right 11/22/2017   Procedure: TOTAL SHOULDER ARTHROPLASTY;  Surgeon: Hiram Gash, MD;  Location: [facility];  Service: Orthopedics;  Laterality: Right;   ULTRASOUND GUIDANCE FOR VASCULAR ACCESS Bilateral 01/22/2020   Procedure: ULTRASOUND GUIDANCE FOR VASCULAR ACCESS, bilateral femoral arteries;  Surgeon: [name], MD;  Location: [facility] OR;  Service: Vascular;  Laterality: Bilateral;       Family History  Problem Relation Age of Onset   Prostate cancer Father        dx. in his 64s   Prostate cancer Brother        dx. in his mid-60s   Breast cancer Sister 91       dx. in her 57s   Dementia Mother    Cancer Maternal Uncle        dx. in his 39s; unknown primary   Cancer Maternal Uncle        dx. in his 23s; unknown primary   Cancer Cousin        dx. >50; blood cancer    Social History   Tobacco Use   Smoking status: Former    Packs/day: 1.00    Years: 7.00    Pack years: 7.00    Types: Cigarettes    Quit date: 06/18/1966    Years since quitting: 54.6   Smokeless tobacco: Never  Vaping Use   Vaping Use: Never used  Substance Use Topics   Alcohol use: Yes    Alcohol/week: 2.0 standard drinks    Types: 2 Glasses of wine per week    Comment: occ   Drug use: No    Home Medications Prior to Admission medications   Medication Sig Start Date End Date Taking? Authorizing Provider  acetaminophen (TYLENOL) 500 MG tablet Take 500 mg by mouth every 6 (six) hours as needed for moderate pain.     [provider]  COVID-19 mRNA Vac-TriS, Pfizer, (PFIZER-BIONT COVID-19 VAC-TRIS) SUSP injection Inject into the muscle. 02/02/21   Carlyle Basques, MD  gabapentin (NEURONTIN) 300 MG capsule Take 300 mg by mouth at bedtime.    [provider]  Multiple Vitamins-Minerals (PRESERVISION AREDS 2) CAPS Take 1 capsule by  mouth daily.     [provider]  Pediatric Multivitamins-Iron (CENTRUM JR/IRON PO) Take 1 tablet by mouth.    [provider]  rosuvastatin (CRESTOR) 10 MG tablet Take 1 tablet (10 mg total) by mouth daily. 01/23/20   Gabriel Earing, PA-C    Allergies  Fluzone quadrivalent [influenza vac split quad]  Review of Systems   Review of Systems 10 systems reviewed and negative except as per HPI Physical Exam Updated Vital Signs BP 95/61   Pulse 78   Temp 100.1 F (37.8 C)   Resp 13   Ht 5\' 7"  (1.702 m)   Wt 70.3 kg   SpO2 93%   BMI 24.28 kg/m   Physical Exam Constitutional:      Comments: Alert, clear mental status.  Giving appropriate history.  No respiratory distress  HENT:     Head: Normocephalic and atraumatic.     Mouth/Throat:     Mouth: Mucous membranes are dry.     Pharynx: Oropharynx is clear.  Eyes:     Extraocular Movements: Extraocular movements intact.  Cardiovascular:     Comments: Borderline tachycardia no rub murmur gallop Pulmonary:     Comments: No respiratory distress.  Lungs clear.  Chest wall does not have any lesions or rashes. Abdominal:     Comments: Abdomen soft.  Moderate deep right lower quadrant discomfort.  Suprapubic feel slightly firm.  Patient has pain to palpation in the inguinal crease and also over the lateral trochanter areas.  There are no visual abnormalities to the soft tissues.  No rashes or cellulitic appearance.  No evident masses within the inguinal canal.  Femoral pulses 2+ and strong.  Genitourinary:    Comments: No scrotal edema.  Testicles nontender and symmetric.  No pain to palpation of the perineum.  No crepitus in the perineum.  Penis normal appearance Musculoskeletal:     Comments: No peripheral edema.  The dorsalis pedis pulses are 2+ and strong.  The feet are warm and dry.  Skin condition and muscular condition of the lower legs is good.  Patient endorses significant pain to palpation over the lateral  trochanter and lateral upper thigh.  He also endorses pain with range of motion at the right hip.  Skin:    General: Skin is warm and dry.     Findings: No rash.  Neurological:     General: No focal deficit present.     Mental Status: He is oriented to person, place, and time.     Cranial Nerves: No cranial nerve deficit.     Coordination: Coordination normal.    ED Results / Procedures / Treatments   Labs (all labs ordered are listed, but only abnormal results are displayed) Labs Reviewed  LACTIC ACID, PLASMA - Abnormal; Notable for the following components:      Result Value   Lactic Acid, Venous 2.2 (*)    All other components within normal limits  COMPREHENSIVE METABOLIC PANEL - Abnormal; Notable for the following components:   Glucose, Bld 140 (*)    Calcium 8.5 (*)    Albumin 3.1 (*)    All other components within normal limits  CBC WITH DIFFERENTIAL/PLATELET - Abnormal; Notable for the following components:   Hemoglobin 12.9 (*)    MCH 25.8 (*)    RDW 18.4 (*)    Neutro Abs 8.9 (*)    Lymphs Abs 0.6 (*)    All other components within normal limits  URINALYSIS, ROUTINE W REFLEX MICROSCOPIC - Abnormal; Notable for the following components:   APPearance HAZY (*)    All other components within normal limits  RESP PANEL BY RT-PCR (FLU A&B, COVID) ARPGX2  CULTURE, BLOOD (SINGLE)  URINE CULTURE  CULTURE, BLOOD (SINGLE)  PROTIME-INR  APTT  LACTIC ACID, PLASMA    EKG  EKG Interpretation  Date/Time:  Wednesday February 03 2021 14:10:06 EDT Ventricular Rate:  100 PR Interval:  151 QRS Duration: 137 QT Interval:  366 QTC Calculation: 473 R Axis:   -48 Text Interpretation: Sinus tachycardia Atrial premature complex Probable left atrial enlargement RBBB and LAFB agree, no sig change from previous Confirmed by Charlesetta Shanks [phone]) on 01/24/2021 2:15:27 PM  Radiology CT Abdomen Pelvis W Contrast  Result Date: 02/05/2021 CLINICAL DATA:  Abdominal abscess/infection  suspected Fever. COVID booster yesterday. Stepped antibiotic for UTI on Monday. Patient reports right groin pain EXAM: CT ABDOMEN AND PELVIS WITH CONTRAST TECHNIQUE: Multidetector CT imaging of the abdomen and pelvis was performed using the standard protocol following bolus administration of intravenous contrast. CONTRAST:  62mL OMNIPAQUE IOHEXOL 350 MG/ML SOLN COMPARISON:  CT 04/07/2020 FINDINGS: Lower chest: Mild cardiomegaly. No basilar airspace disease. Minor dependent atelectasis in the right lower lobe. No pleural fluid. Hepatobiliary: No focal liver abnormality is seen. No gallstones, gallbladder wall thickening, or biliary dilatation. Pancreas: No ductal dilatation or inflammation. Spleen: Normal in size without focal abnormality. Adrenals/Urinary Tract: No adrenal nodule. There is no hydronephrosis. Bilateral parapelvic as well as cortical cysts in both kidneys. No significant perinephric edema. Urinary bladder is partially obscured by streak artifact from right hip arthroplasty. No definite bladder wall thickening. Stomach/Bowel: Tiny hiatal hernia. No gastric wall thickening. No duodenal wall thickening. Occasional fluid-filled loops of small bowel in the lower abdomen and pelvis without abnormal distension or inflammation. No obstruction. Right colectomy. Moderate stool burden in the colon. There is colonic redundancy. Diverticulosis of the descending and sigmoid colon without diverticulitis. The sigmoid colon is redundant. Detailed assessment of mid sigmoid is partially obscured by streak artifact. Vascular/Lymphatic: Patient is post aorto bi-iliac stent graft repair of abdominal aortic aneurysm. The residual aneurysm sac has diminished in size currently measuring 4.2 cm, previously 4.8 cm. However there is air within the aneurysm sac at the level of the pant leg anastomosis. Additionally there is indistinct fat planes adjacent to the abdominal aorta with hazy stranding extending from the diaphragmatic  hiatus to the iliac bifurcation. The previous type 2 endoleak is not definitively seen on this exam, possibly due to phase of contrast. 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 No definite enlarged abdominopelvic lymph nodes. There multiple small retroperitoneal nodes adjacent to the upper abdominal aorta. Reproductive: Brachytherapy seeds in the prostate. Other: No free air or ascites. Tiny fat containing umbilical hernia. Prior ventral abdominal wall hernia repair with mesh. Musculoskeletal: Right hip arthroplasty. Advanced left hip osteoarthritis. Moderate degenerative change in the lumbar spine. No acute osseous abnormalities are seen. IMPRESSION: 1. Post aorto bi-iliac stent graft repair of abdominal aortic aneurysm. There is air within the aneurysm sac that was not seen on prior exams. Additionally, indistinct fat planes adjacent to the abdominal aorta with hazy stranding extending from the diaphragmatic hiatus to the iliac bifurcation. Graft infection is considered. Consider nuclear medicine white blood cell scan for further assessment. There is no evidence of anterior or aortic fistula after explanation of air in the graft. 2. Infrarenal aortic aneurysm sac is diminished in size from prior exam. The previous type 2 endoleak is not definitively seen on this exam, possibly due to phase of contrast. 3. Colonic diverticulosis without diverticulitis. Electronically Signed   By: Keith Rake M.D.   On: 02/01/2021 16:00   DG Chest [facility]  1 View  Result Date: 02/11/2021 CLINICAL DATA:  Fever EXAM: PORTABLE CHEST 1 VIEW COMPARISON:  01/22/2020 FINDINGS: Mild cardiomegaly. No focal opacity or pleural effusion. No pneumothorax. Right shoulder replacement IMPRESSION: No active disease.  Mild cardiomegaly Electronically Signed   By: Donavan Foil M.D.   On:  01/20/2021 15:18    Procedures Procedures  CRITICAL CARE Performed by: Charlesetta Shanks   Total critical care time: 45 minutes  Critical care time was exclusive of separately billable procedures and treating other patients.  Critical care was necessary to treat or prevent imminent or life-threatening deterioration.  Critical care was time spent personally by me on the following activities: development of treatment plan with patient and/or surrogate as well as nursing, discussions with consultants, evaluation of patient's response to treatment, examination of patient, obtaining history from patient or surrogate, ordering and performing treatments and interventions, ordering and review of laboratory studies, ordering and review of radiographic studies, pulse oximetry and re-evaluation of patient's condition.  Medications Ordered in ED Medications  lactated ringers infusion ( Intravenous New Bag/Given 01/16/2021 1415)  lactated ringers bolus 1,000 mL (has no administration in time range)    And  lactated ringers bolus 1,000 mL (has no administration in time range)    And  lactated ringers bolus 250 mL (has no administration in time range)  ceFEPIme (MAXIPIME) 2 g in sodium chloride 0.9 % 100 mL IVPB (has no administration in time range)  vancomycin (VANCOCIN) IVPB 1000 mg/200 mL premix (has no administration in time range)  acetaminophen (TYLENOL) tablet 1,000 mg (1,000 mg Oral Given 02/02/2021 1414)  iohexol (OMNIPAQUE) 350 MG/ML injection 100 mL (80 mLs Intravenous Contrast Given 01/29/2021 1504)  sodium chloride (PF) 0.9 % injection (  Given 02/08/2021 1513)    ED Course  I have reviewed the triage vital signs and the nursing notes.  Pertinent labs & imaging results that were available during my care of the patient were reviewed by me and considered in my medical decision making (see chart for details).    MDM Rules/Calculators/A&P                           Patient presents as outlined.  He  did have COVID booster yesterday.  He has tolerated all previous boosters without reaction.  He developed severe pain in his right inguinal region and right hip and leg.  He had aching in both legs and difficulty walking due to pain.  Patient spiked a fever.  He arrived with a temperature of 102.  No appreciable masses or ischemia of the lower limbs.  Sepsis evaluation initiated.  Patient blood pressures have trended down slightly.  His mental status and respiratory status remained good.  With combination of soft blood pressures now, fever and suspected source of lower extremity or vascular etiology based on CT scan, will start sepsis treatment with fluid bolus and broad-spectrum antibiotics.  Vascular surgery will need to be consulted to review CT scan results.  Patient does have good femoral and dorsalis pedis pulses no clinical sign of arterial obstruction.  Patient will need to be admitted for septic presentation and further diagnostic evaluation.  Dr. Zenia Resides to continue with consult and admission.  Patient and his wife updated on plan. Final Clinical Impression(s) / ED Diagnoses Final diagnoses:  Sepsis, due to unspecified organism, unspecified whether acute organ dysfunction present [facility])  Acute pain of right lower extremity    Rx / DC Orders ED  Discharge Orders     None        Charlesetta Shanks, MD 01/22/2021 1623

## 2021-02-03 NOTE — ED Provider Notes (Signed)
Patient signed out to me by Dr. Vallery Ridge pending results of work-up which showed that he has an infected aortic graft.  Code sepsis IV initiated and patient started on IV antibiotics.  Discussed with vascular surgeon, Dr. Gwenlyn Saran, who requested patient go to [facility] in the ER.  He will see the patient either here in the ER here or there.   Lacretia Leigh, MD 02/10/2021 [phone]

## 2021-02-03 NOTE — ED Notes (Signed)
Report given to charge nurse at [facility].

## 2021-02-03 NOTE — Progress Notes (Signed)
Pharmacy Antibiotic Note  Aaron Hawkins is a 80 y.o. male transferred from [facility] on 02/12/2021 with  sepsis s/p infected graft .  Pharmacy has been consulted for vancomycin dosing.   Plan: TBW 70.3 kg; Ht 67 in, BMI 24 Scr 0.91 CrCl 61 mL/min Lactic Acid 2.2 >> 1.3  Vancomycin 1500 mg bolus given   Vancomycin 1250 mg IV q24h (eAUC 452.0) Monitor renal function, vanc levels at steady state, cx results, and clinical progression.  Height: 5\' 7"  (170.2 cm) Weight: 70.3 kg (155 lb) IBW/kg (Calculated) : 66.1  Temp (24hrs), Avg:100.6 F (38.1 C), Min:98.9 F (37.2 C), Max:102.7 F (39.3 C)  Recent Labs  Lab 02/12/2021 1300 01/16/2021 1601  WBC 10.2  --   CREATININE 0.91  --   LATICACIDVEN 2.2* 1.3    Estimated Creatinine Clearance: 60.5 mL/min (by C-G formula based on SCr of 0.91 mg/dL).    Allergies  Allergen Reactions   Fluzone Quadrivalent [Influenza Vac Split Quad] Other (See Comments)    GBS    Antimicrobials this admission: 7/20 Vancomycin >>  7/20 Cefepime 2gm >>  Microbiology results: 7/20 BCx: Pending 7/20 UCx: Pending   Thank you for allowing pharmacy to be a part of this patient's care.  Marlowe Alt, PharmD Candidate 02/13/2021 7:36 PM

## 2021-02-04 ENCOUNTER — Encounter (HOSPITAL_COMMUNITY): Admission: EM | Disposition: E | Payer: Self-pay | Source: Home / Self Care | Attending: Vascular Surgery

## 2021-02-04 ENCOUNTER — Inpatient Hospital Stay (HOSPITAL_COMMUNITY): Payer: Medicare Other | Admitting: Anesthesiology

## 2021-02-04 ENCOUNTER — Encounter (HOSPITAL_COMMUNITY): Payer: Self-pay | Admitting: Vascular Surgery

## 2021-02-04 DIAGNOSIS — T827XXA Infection and inflammatory reaction due to other cardiac and vascular devices, implants and grafts, initial encounter: Secondary | ICD-10-CM

## 2021-02-04 DIAGNOSIS — I772 Rupture of artery: Secondary | ICD-10-CM

## 2021-02-04 DIAGNOSIS — Y832 Surgical operation with anastomosis, bypass or graft as the cause of abnormal reaction of the patient, or of later complication, without mention of misadventure at the time of the procedure: Secondary | ICD-10-CM

## 2021-02-04 HISTORY — PX: APPLICATION OF WOUND VAC: SHX5189

## 2021-02-04 HISTORY — PX: AXILLARY-FEMORAL BYPASS GRAFT: SHX894

## 2021-02-04 HISTORY — PX: REMOVAL OF ABDOMINAL AORTIC STENT GRAFT: SHX6221

## 2021-02-04 LAB — POCT I-STAT 7, (LYTES, BLD GAS, ICA,H+H)
Acid-Base Excess: 3 mmol/L — ABNORMAL HIGH (ref 0.0–2.0)
Acid-Base Excess: 3 mmol/L — ABNORMAL HIGH (ref 0.0–2.0)
Acid-base deficit: 2 mmol/L (ref 0.0–2.0)
Acid-base deficit: 8 mmol/L — ABNORMAL HIGH (ref 0.0–2.0)
Acid-base deficit: 12 mmol/L — ABNORMAL HIGH (ref 0.0–2.0)
Acid-base deficit: 5 mmol/L — ABNORMAL HIGH (ref 0.0–2.0)
Acid-base deficit: 12 mmol/L — ABNORMAL HIGH (ref 0.0–2.0)
Acid-base deficit: 12 mmol/L — ABNORMAL HIGH (ref 0.0–2.0)
Bicarbonate: 28.1 mmol/L — ABNORMAL HIGH (ref 20.0–28.0)
Bicarbonate: 27.6 mmol/L (ref 20.0–28.0)
Bicarbonate: 18.7 mmol/L — ABNORMAL LOW (ref 20.0–28.0)
Bicarbonate: 23.4 mmol/L (ref 20.0–28.0)
Bicarbonate: 15.9 mmol/L — ABNORMAL LOW (ref 20.0–28.0)
Bicarbonate: 22.1 mmol/L (ref 20.0–28.0)
Bicarbonate: 15.4 mmol/L — ABNORMAL LOW (ref 20.0–28.0)
Bicarbonate: 16 mmol/L — ABNORMAL LOW (ref 20.0–28.0)
Calcium, Ion: 1.18 mmol/L (ref 1.15–1.40)
Calcium, Ion: 1.11 mmol/L — ABNORMAL LOW (ref 1.15–1.40)
Calcium, Ion: 1.04 mmol/L — ABNORMAL LOW (ref 1.15–1.40)
Calcium, Ion: 1.21 mmol/L (ref 1.15–1.40)
Calcium, Ion: 0.85 mmol/L — CL (ref 1.15–1.40)
Calcium, Ion: 1.34 mmol/L (ref 1.15–1.40)
Calcium, Ion: 0.55 mmol/L — CL (ref 1.15–1.40)
Calcium, Ion: 0.64 mmol/L — CL (ref 1.15–1.40)
HCT: 35 % — ABNORMAL LOW (ref 39.0–52.0)
HCT: 34 % — ABNORMAL LOW (ref 39.0–52.0)
HCT: 23 % — ABNORMAL LOW (ref 39.0–52.0)
HCT: 28 % — ABNORMAL LOW (ref 39.0–52.0)
HCT: 17 % — ABNORMAL LOW (ref 39.0–52.0)
HCT: 18 % — ABNORMAL LOW (ref 39.0–52.0)
HCT: 16 % — ABNORMAL LOW (ref 39.0–52.0)
HCT: 19 % — ABNORMAL LOW (ref 39.0–52.0)
Hemoglobin: 11.9 g/dL — ABNORMAL LOW (ref 13.0–17.0)
Hemoglobin: 11.6 g/dL — ABNORMAL LOW (ref 13.0–17.0)
Hemoglobin: 7.8 g/dL — ABNORMAL LOW (ref 13.0–17.0)
Hemoglobin: 9.5 g/dL — ABNORMAL LOW (ref 13.0–17.0)
Hemoglobin: 5.8 g/dL — CL (ref 13.0–17.0)
Hemoglobin: 6.1 g/dL — CL (ref 13.0–17.0)
Hemoglobin: 5.4 g/dL — CL (ref 13.0–17.0)
Hemoglobin: 6.5 g/dL — CL (ref 13.0–17.0)
O2 Saturation: 100 %
O2 Saturation: 100 %
O2 Saturation: 100 %
O2 Saturation: 100 %
O2 Saturation: 100 %
O2 Saturation: 100 %
O2 Saturation: 100 %
O2 Saturation: 100 %
Patient temperature: 35.5
Patient temperature: 37.2
Patient temperature: 36
Patient temperature: 36.7
Patient temperature: 35.9
Patient temperature: 35
Patient temperature: 35.2
Potassium: 3.8 mmol/L (ref 3.5–5.1)
Potassium: 4.1 mmol/L (ref 3.5–5.1)
Potassium: 4.6 mmol/L (ref 3.5–5.1)
Potassium: 6.1 mmol/L — ABNORMAL HIGH (ref 3.5–5.1)
Potassium: 4.5 mmol/L (ref 3.5–5.1)
Potassium: 5.9 mmol/L — ABNORMAL HIGH (ref 3.5–5.1)
Potassium: 4.9 mmol/L (ref 3.5–5.1)
Potassium: 5.2 mmol/L — ABNORMAL HIGH (ref 3.5–5.1)
Sodium: 136 mmol/L (ref 135–145)
Sodium: 135 mmol/L (ref 135–145)
Sodium: 132 mmol/L — ABNORMAL LOW (ref 135–145)
Sodium: 134 mmol/L — ABNORMAL LOW (ref 135–145)
Sodium: 138 mmol/L (ref 135–145)
Sodium: 140 mmol/L (ref 135–145)
Sodium: 140 mmol/L (ref 135–145)
Sodium: 141 mmol/L (ref 135–145)
TCO2: 29 mmol/L (ref 22–32)
TCO2: 29 mmol/L (ref 22–32)
TCO2: 20 mmol/L — ABNORMAL LOW (ref 22–32)
TCO2: 25 mmol/L (ref 22–32)
TCO2: 17 mmol/L — ABNORMAL LOW (ref 22–32)
TCO2: 24 mmol/L (ref 22–32)
TCO2: 17 mmol/L — ABNORMAL LOW (ref 22–32)
TCO2: 17 mmol/L — ABNORMAL LOW (ref 22–32)
pCO2 arterial: 43.4 mmHg (ref 32.0–48.0)
pCO2 arterial: 41.2 mmHg (ref 32.0–48.0)
pCO2 arterial: 38.7 mmHg (ref 32.0–48.0)
pCO2 arterial: 39.6 mmHg (ref 32.0–48.0)
pCO2 arterial: 47.5 mmHg (ref 32.0–48.0)
pCO2 arterial: 52 mmHg — ABNORMAL HIGH (ref 32.0–48.0)
pCO2 arterial: 38.1 mmHg (ref 32.0–48.0)
pCO2 arterial: 44.2 mmHg (ref 32.0–48.0)
pH, Arterial: 7.413 (ref 7.350–7.450)
pH, Arterial: 7.435 (ref 7.350–7.450)
pH, Arterial: 7.288 — ABNORMAL LOW (ref 7.350–7.450)
pH, Arterial: 7.379 (ref 7.350–7.450)
pH, Arterial: 7.131 — CL (ref 7.350–7.450)
pH, Arterial: 7.229 — ABNORMAL LOW (ref 7.350–7.450)
pH, Arterial: 7.156 — CL (ref 7.350–7.450)
pH, Arterial: 7.205 — ABNORMAL LOW (ref 7.350–7.450)
pO2, Arterial: 293 mmHg — ABNORMAL HIGH (ref 83.0–108.0)
pO2, Arterial: 280 mmHg — ABNORMAL HIGH (ref 83.0–108.0)
pO2, Arterial: 339 mmHg — ABNORMAL HIGH (ref 83.0–108.0)
pO2, Arterial: 497 mmHg — ABNORMAL HIGH (ref 83.0–108.0)
pO2, Arterial: 302 mmHg — ABNORMAL HIGH (ref 83.0–108.0)
pO2, Arterial: 490 mmHg — ABNORMAL HIGH (ref 83.0–108.0)
pO2, Arterial: 267 mmHg — ABNORMAL HIGH (ref 83.0–108.0)
pO2, Arterial: 314 mmHg — ABNORMAL HIGH (ref 83.0–108.0)

## 2021-02-04 LAB — BLOOD CULTURE ID PANEL (REFLEXED) - BCID2
A.calcoaceticus-baumannii: NOT DETECTED
Bacteroides fragilis: NOT DETECTED
Candida albicans: NOT DETECTED
Candida auris: NOT DETECTED
Candida glabrata: NOT DETECTED
Candida krusei: NOT DETECTED
Candida parapsilosis: NOT DETECTED
Candida tropicalis: NOT DETECTED
Cryptococcus neoformans/gattii: NOT DETECTED
Enterobacter cloacae complex: NOT DETECTED
Enterobacterales: NOT DETECTED
Enterococcus Faecium: NOT DETECTED
Enterococcus faecalis: NOT DETECTED
Escherichia coli: NOT DETECTED
Haemophilus influenzae: NOT DETECTED
Klebsiella aerogenes: NOT DETECTED
Klebsiella oxytoca: NOT DETECTED
Klebsiella pneumoniae: NOT DETECTED
Listeria monocytogenes: NOT DETECTED
Methicillin resistance mecA/C: NOT DETECTED
Neisseria meningitidis: NOT DETECTED
Proteus species: NOT DETECTED
Pseudomonas aeruginosa: NOT DETECTED
Salmonella species: NOT DETECTED
Serratia marcescens: NOT DETECTED
Staphylococcus aureus (BCID): NOT DETECTED
Staphylococcus epidermidis: DETECTED — AB
Staphylococcus lugdunensis: NOT DETECTED
Staphylococcus species: DETECTED — AB
Stenotrophomonas maltophilia: NOT DETECTED
Streptococcus agalactiae: NOT DETECTED
Streptococcus pneumoniae: NOT DETECTED
Streptococcus pyogenes: NOT DETECTED
Streptococcus species: DETECTED — AB

## 2021-02-04 LAB — POCT ACTIVATED CLOTTING TIME: Activated Clotting Time: 242 seconds

## 2021-02-04 LAB — PREPARE RBC (CROSSMATCH)

## 2021-02-04 LAB — SURGICAL PCR SCREEN
MRSA, PCR: NEGATIVE
Staphylococcus aureus: NEGATIVE

## 2021-02-04 SURGERY — CREATION, BYPASS, ARTERIAL, AXILLARY TO BILATERAL FEMORAL, USING GRAFT
Anesthesia: General | Site: Abdomen

## 2021-02-04 MED ORDER — NOREPINEPHRINE 4 MG/250ML-% IV SOLN
INTRAVENOUS | Status: DC | PRN
  Administered 2021-02-04: 5 ug/min via INTRAVENOUS

## 2021-02-04 MED ORDER — CHLORHEXIDINE GLUCONATE 0.12 % MT SOLN
OROMUCOSAL | Status: AC
  Administered 2021-02-04: 15 mL via OROMUCOSAL
  Filled 2021-02-04: qty 15

## 2021-02-04 MED ORDER — CHLORHEXIDINE GLUCONATE 0.12 % MT SOLN: 15.0000 mL | Freq: Once | OROMUCOSAL | Status: AC

## 2021-02-04 MED ORDER — SODIUM BICARBONATE 8.4 % IV SOLN
INTRAVENOUS | Status: DC | PRN
  Administered 2021-02-04: 100 meq via INTRAVENOUS
  Administered 2021-02-04 (×4): 50 meq via INTRAVENOUS
  Administered 2021-02-04: 100 meq via INTRAVENOUS
  Administered 2021-02-04: 50 meq via INTRAVENOUS
  Administered 2021-02-04: 100 meq via INTRAVENOUS
  Administered 2021-02-04: 50 meq via INTRAVENOUS

## 2021-02-04 MED ORDER — EPINEPHRINE 1 MG/10ML IJ SOSY
PREFILLED_SYRINGE | INTRAMUSCULAR | Status: DC | PRN
  Administered 2021-02-04: 1000 ug via INTRAVENOUS
  Administered 2021-02-04: 10 ug via INTRAVENOUS
  Administered 2021-02-04: 150 ug via INTRAVENOUS
  Administered 2021-02-04: 100 ug via INTRAVENOUS
  Administered 2021-02-04: 40 ug via INTRAVENOUS
  Administered 2021-02-04: 20 ug via INTRAVENOUS
  Administered 2021-02-04: 50 ug via INTRAVENOUS
  Administered 2021-02-04: 10 ug via INTRAVENOUS
  Administered 2021-02-04: 30 ug via INTRAVENOUS
  Administered 2021-02-04: 50 ug via INTRAVENOUS
  Administered 2021-02-04: 100 ug via INTRAVENOUS
  Administered 2021-02-04: 20 ug via INTRAVENOUS
  Administered 2021-02-04: 10 ug via INTRAVENOUS
  Administered 2021-02-04 (×3): 100 ug via INTRAVENOUS
  Administered 2021-02-04 (×4): 1000 ug via INTRAVENOUS

## 2021-02-04 MED ORDER — ROCURONIUM BROMIDE 10 MG/ML (PF) SYRINGE
PREFILLED_SYRINGE | INTRAVENOUS | Status: AC
  Filled 2021-02-04: qty 10

## 2021-02-04 MED ORDER — 0.9 % SODIUM CHLORIDE (POUR BTL) OPTIME
TOPICAL | Status: DC | PRN
  Administered 2021-02-04 (×4): 1000 mL

## 2021-02-04 MED ORDER — PROTAMINE SULFATE 10 MG/ML IV SOLN
INTRAVENOUS | Status: DC | PRN
  Administered 2021-02-04: 25 mg via INTRAVENOUS
  Administered 2021-02-04: 50 mg via INTRAVENOUS
  Administered 2021-02-04: 25 mg via INTRAVENOUS

## 2021-02-04 MED ORDER — PROTAMINE SULFATE 10 MG/ML IV SOLN
INTRAVENOUS | Status: DC | PRN
  Administered 2021-02-04: 40 mg via INTRAVENOUS
  Administered 2021-02-04: 10 mg via INTRAVENOUS

## 2021-02-04 MED ORDER — VASOPRESSIN 20 UNIT/ML IV SOLN
INTRAVENOUS | Status: DC | PRN
  Administered 2021-02-04 (×2): 5 [IU] via INTRAVENOUS
  Administered 2021-02-04: 10 [IU] via INTRAVENOUS

## 2021-02-04 MED ORDER — ORAL CARE MOUTH RINSE: 15.0000 mL | Freq: Once | OROMUCOSAL | Status: AC

## 2021-02-04 MED ORDER — ACETAMINOPHEN 325 MG PO TABS
650.0000 mg | ORAL_TABLET | Freq: Four times a day (QID) | ORAL | Status: AC | PRN
  Administered 2021-02-04: 650 mg via ORAL
  Filled 2021-02-04: qty 2

## 2021-02-04 MED ORDER — HEPARIN 6000 UNIT IRRIGATION SOLUTION
Status: DC | PRN
  Administered 2021-02-04: 1

## 2021-02-04 MED ORDER — PHENYLEPHRINE HCL-NACL 10-0.9 MG/250ML-% IV SOLN
INTRAVENOUS | Status: DC | PRN
  Administered 2021-02-04: 25 ug/min via INTRAVENOUS

## 2021-02-04 MED ORDER — ALBUMIN HUMAN 5 % IV SOLN: INTRAVENOUS | Status: DC | PRN

## 2021-02-04 MED ORDER — VASOPRESSIN 20 UNIT/ML IV SOLN
INTRAVENOUS | Status: AC
  Filled 2021-02-04: qty 1

## 2021-02-04 MED ORDER — FENTANYL CITRATE (PF) 250 MCG/5ML IJ SOLN
INTRAMUSCULAR | Status: AC
  Filled 2021-02-04: qty 5

## 2021-02-04 MED ORDER — NOREPINEPHRINE 4 MG/250ML-% IV SOLN
INTRAVENOUS | Status: AC
  Filled 2021-02-04: qty 250

## 2021-02-04 MED ORDER — HEPARIN SODIUM (PORCINE) 1000 UNIT/ML IJ SOLN
INTRAMUSCULAR | Status: DC | PRN
  Administered 2021-02-04: 3000 [IU] via INTRAVENOUS
  Administered 2021-02-04: 8000 [IU] via INTRAVENOUS
  Administered 2021-02-04: 3000 [IU] via INTRAVENOUS

## 2021-02-04 MED ORDER — DEXAMETHASONE SODIUM PHOSPHATE 10 MG/ML IJ SOLN
INTRAMUSCULAR | Status: DC | PRN
  Administered 2021-02-04: 10 mg via INTRAVENOUS

## 2021-02-04 MED ORDER — SODIUM BICARBONATE 8.4 % IV SOLN
INTRAVENOUS | Status: AC
  Filled 2021-02-04: qty 50

## 2021-02-04 MED ORDER — LIDOCAINE 2% (20 MG/ML) 5 ML SYRINGE
INTRAMUSCULAR | Status: DC | PRN
  Administered 2021-02-04: 40 mg via INTRAVENOUS

## 2021-02-04 MED ORDER — SUCCINYLCHOLINE CHLORIDE 200 MG/10ML IV SOSY
PREFILLED_SYRINGE | INTRAVENOUS | Status: AC
  Filled 2021-02-04: qty 20

## 2021-02-04 MED ORDER — SODIUM CHLORIDE 0.9% IV SOLUTION: Freq: Once | INTRAVENOUS | Status: DC

## 2021-02-04 MED ORDER — EPHEDRINE 5 MG/ML INJ
INTRAVENOUS | Status: AC
  Filled 2021-02-04: qty 10

## 2021-02-04 MED ORDER — PROPOFOL 10 MG/ML IV BOLUS
INTRAVENOUS | Status: DC | PRN
  Administered 2021-02-04: 120 mg via INTRAVENOUS

## 2021-02-04 MED ORDER — PROTAMINE SULFATE 10 MG/ML IV SOLN
INTRAVENOUS | Status: AC
  Filled 2021-02-04: qty 25

## 2021-02-04 MED ORDER — ROCURONIUM BROMIDE 10 MG/ML (PF) SYRINGE
PREFILLED_SYRINGE | INTRAVENOUS | Status: AC
  Filled 2021-02-04: qty 20

## 2021-02-04 MED ORDER — PHENYLEPHRINE 40 MCG/ML (10ML) SYRINGE FOR IV PUSH (FOR BLOOD PRESSURE SUPPORT)
PREFILLED_SYRINGE | INTRAVENOUS | Status: AC
  Filled 2021-02-04: qty 20

## 2021-02-04 MED ORDER — MIDAZOLAM HCL 5 MG/5ML IJ SOLN
INTRAMUSCULAR | Status: DC | PRN
  Administered 2021-02-04: 1 mg via INTRAVENOUS

## 2021-02-04 MED ORDER — HEMOSTATIC AGENTS (NO CHARGE) OPTIME
TOPICAL | Status: DC | PRN
  Administered 2021-02-04: 1 via TOPICAL

## 2021-02-04 MED ORDER — HEPARIN 6000 UNIT IRRIGATION SOLUTION
Status: AC
  Filled 2021-02-04: qty 500

## 2021-02-04 MED ORDER — LIDOCAINE 2% (20 MG/ML) 5 ML SYRINGE
INTRAMUSCULAR | Status: AC
  Filled 2021-02-04: qty 10

## 2021-02-04 MED ORDER — FENTANYL CITRATE (PF) 250 MCG/5ML IJ SOLN
INTRAMUSCULAR | Status: DC | PRN
  Administered 2021-02-04 (×7): 50 ug via INTRAVENOUS

## 2021-02-04 MED ORDER — NOREPINEPHRINE BITARTRATE 1 MG/ML IV SOLN
INTRAVENOUS | Status: DC | PRN
  Administered 2021-02-04: .5 mL via INTRAVENOUS

## 2021-02-04 MED ORDER — MIDAZOLAM HCL 2 MG/2ML IJ SOLN
INTRAMUSCULAR | Status: AC
  Filled 2021-02-04: qty 2

## 2021-02-04 MED ORDER — PHENYLEPHRINE 40 MCG/ML (10ML) SYRINGE FOR IV PUSH (FOR BLOOD PRESSURE SUPPORT)
PREFILLED_SYRINGE | INTRAVENOUS | Status: DC | PRN
  Administered 2021-02-04 (×10): 80 ug via INTRAVENOUS

## 2021-02-04 MED ORDER — LACTATED RINGERS IV SOLN: INTRAVENOUS | Status: DC

## 2021-02-04 MED ORDER — ONDANSETRON HCL 4 MG/2ML IJ SOLN
INTRAMUSCULAR | Status: AC
  Filled 2021-02-04: qty 2

## 2021-02-04 MED ORDER — LACTATED RINGERS IV SOLN: INTRAVENOUS | Status: DC | PRN

## 2021-02-04 MED ORDER — SODIUM CHLORIDE 0.9 % IV SOLN
INTRAVENOUS | Status: DC | PRN
  Administered 2021-02-04: 5 ug/min via INTRAVENOUS

## 2021-02-04 MED ORDER — CALCIUM CHLORIDE 10 % IV SOLN
INTRAVENOUS | Status: DC | PRN
  Administered 2021-02-04: 1000 mg via INTRAVENOUS
  Administered 2021-02-04: 500 mg via INTRAVENOUS
  Administered 2021-02-04: 1000 mg via INTRAVENOUS
  Administered 2021-02-04: 100 mg via INTRAVENOUS
  Administered 2021-02-04 (×6): 1000 mg via INTRAVENOUS
  Administered 2021-02-04: 200 mg via INTRAVENOUS
  Administered 2021-02-04 (×2): 500 mg via INTRAVENOUS
  Administered 2021-02-04: 1000 mg via INTRAVENOUS
  Administered 2021-02-04: 200 mg via INTRAVENOUS
  Administered 2021-02-04: 1000 mg via INTRAVENOUS

## 2021-02-04 MED ORDER — ROCURONIUM BROMIDE 10 MG/ML (PF) SYRINGE
PREFILLED_SYRINGE | INTRAVENOUS | Status: DC | PRN
  Administered 2021-02-04: 20 mg via INTRAVENOUS
  Administered 2021-02-04: 10 mg via INTRAVENOUS
  Administered 2021-02-04: 80 mg via INTRAVENOUS
  Administered 2021-02-04 (×2): 50 mg via INTRAVENOUS
  Administered 2021-02-04 (×2): 10 mg via INTRAVENOUS
  Administered 2021-02-04: 70 mg via INTRAVENOUS

## 2021-02-04 MED ORDER — ARTIFICIAL TEARS OPHTHALMIC OINT
TOPICAL_OINTMENT | OPHTHALMIC | Status: AC
  Filled 2021-02-04: qty 7

## 2021-02-04 SURGICAL SUPPLY — 61 items
ADH SKN CLS APL DERMABOND .7 (GAUZE/BANDAGES/DRESSINGS) ×2
BAG COUNTER SPONGE SURGICOUNT (BAG) ×9 IMPLANT
BAG SPNG CNTER NS LX DISP (BAG) ×8
CANISTER SUCT 3000ML PPV (MISCELLANEOUS) ×2 IMPLANT
CANISTER WOUND CARE 500ML ATS (WOUND CARE) ×1 IMPLANT
CATH EMB 3FR 40CM (CATHETERS) ×2 IMPLANT
CATH EMB 5FR 80CM (CATHETERS) ×1 IMPLANT
CLIP LIGATING EXTRA MED SLVR (CLIP) ×2 IMPLANT
CLIP LIGATING EXTRA SM BLUE (MISCELLANEOUS) ×2 IMPLANT
COVER PROBE W GEL 5X96 (DRAPES) ×1 IMPLANT
DERMABOND ADVANCED (GAUZE/BANDAGES/DRESSINGS) ×2
DERMABOND ADVANCED .7 DNX12 (GAUZE/BANDAGES/DRESSINGS) ×1 IMPLANT
ELECT BLADE 4.0 EZ CLEAN MEGAD (MISCELLANEOUS) ×2
ELECT REM PT RETURN 9FT ADLT (ELECTROSURGICAL) ×2
ELECTRODE BLDE 4.0 EZ CLN MEGD (MISCELLANEOUS) IMPLANT
ELECTRODE REM PT RTRN 9FT ADLT (ELECTROSURGICAL) ×1 IMPLANT
GAUZE 4X4 16PLY ~~LOC~~+RFID DBL (SPONGE) ×3 IMPLANT
GLOVE SURG ENC MOIS LTX SZ7.5 (GLOVE) ×2 IMPLANT
GLOVE SURG UNDER POLY LF SZ7.5 (GLOVE) ×1 IMPLANT
GOWN STRL REUS W/ TWL LRG LVL3 (GOWN DISPOSABLE) ×2 IMPLANT
GOWN STRL REUS W/ TWL XL LVL3 (GOWN DISPOSABLE) ×1 IMPLANT
GOWN STRL REUS W/TWL LRG LVL3 (GOWN DISPOSABLE) ×4
GOWN STRL REUS W/TWL XL LVL3 (GOWN DISPOSABLE) ×2
GRAFT PROPATEN W/RING 8X80X70 (Vascular Products) ×1 IMPLANT
INSERT FOGARTY 61MM (MISCELLANEOUS) ×1 IMPLANT
INSERT FOGARTY SM (MISCELLANEOUS) ×2 IMPLANT
KIT BASIN OR (CUSTOM PROCEDURE TRAY) ×2 IMPLANT
KIT TURNOVER KIT B (KITS) ×2 IMPLANT
LOOP VESSEL MINI RED (MISCELLANEOUS) ×2 IMPLANT
NS IRRIG 1000ML POUR BTL (IV SOLUTION) ×5 IMPLANT
PACK AORTA (CUSTOM PROCEDURE TRAY) ×1 IMPLANT
PENCIL BUTTON HOLSTER BLD 10FT (ELECTRODE) ×1 IMPLANT
POWDER SURGICEL 3.0 GRAM (HEMOSTASIS) ×1 IMPLANT
SPONGE ABDOMINAL VAC ABTHERA (MISCELLANEOUS) ×1 IMPLANT
SPONGE T-LAP 18X18 ~~LOC~~+RFID (SPONGE) ×15 IMPLANT
STOPCOCK 4 WAY LG BORE MALE ST (IV SETS) ×2 IMPLANT
SUT MNCRL AB 4-0 PS2 18 (SUTURE) ×7 IMPLANT
SUT PDS AB 1 TP1 54 (SUTURE) ×1 IMPLANT
SUT PROLENE 1 XLH 60 (SUTURE) ×2 IMPLANT
SUT PROLENE 2 0 SH DA (SUTURE) ×4 IMPLANT
SUT PROLENE 3 0 SH DA (SUTURE) ×5 IMPLANT
SUT PROLENE 5 0 C 1 24 (SUTURE) ×3 IMPLANT
SUT PROLENE 5 0 C 1 36 (SUTURE) ×2 IMPLANT
SUT PROLENE 6 0 BV (SUTURE) ×8 IMPLANT
SUT PROLENE 6 0 CC (SUTURE) ×1 IMPLANT
SUT SILK 2 0 (SUTURE) ×2
SUT SILK 2-0 18XBRD TIE 12 (SUTURE) IMPLANT
SUT SILK 3 0 (SUTURE) ×2
SUT SILK 3-0 18XBRD TIE 12 (SUTURE) IMPLANT
SUT VIC AB 2-0 CT1 27 (SUTURE) ×10
SUT VIC AB 2-0 CT1 TAPERPNT 27 (SUTURE) ×2 IMPLANT
SUT VIC AB 3-0 SH 18 (SUTURE) ×1 IMPLANT
SUT VIC AB 3-0 SH 27 (SUTURE) ×12
SUT VIC AB 3-0 SH 27X BRD (SUTURE) ×2 IMPLANT
SYR 3ML LL SCALE MARK (SYRINGE) ×2 IMPLANT
TAPE UMBILICAL 1/8X30 (MISCELLANEOUS) ×1 IMPLANT
TOWEL GREEN STERILE (TOWEL DISPOSABLE) ×3 IMPLANT
TRAY FOLEY SLVR 16FR TEMP STAT (SET/KITS/TRAYS/PACK) ×1 IMPLANT
TUBE CONNECTING 12X1/4 (SUCTIONS) ×1 IMPLANT
TUBE CONNECTING 20X1/4 (TUBING) ×1 IMPLANT
WATER STERILE IRR 1000ML POUR (IV SOLUTION) ×2 IMPLANT

## 2021-02-05 ENCOUNTER — Encounter (HOSPITAL_COMMUNITY): Payer: Self-pay | Admitting: Vascular Surgery

## 2021-02-05 LAB — PREPARE PLATELET PHERESIS
Unit division: 0
Unit division: 0
Unit division: 0

## 2021-02-05 LAB — BPAM FFP
Blood Product Expiration Date: 202207242359
Blood Product Expiration Date: 202207262359
Blood Product Expiration Date: 202207262359
Blood Product Expiration Date: 202207262359
Blood Product Expiration Date: 202207262359
Blood Product Expiration Date: 202207262359
Blood Product Expiration Date: 202207262359
Blood Product Expiration Date: 202207262359
Blood Product Expiration Date: 202207262359
Blood Product Expiration Date: 202207262359
Blood Product Expiration Date: 202207262359
Blood Product Expiration Date: 202207262359
Blood Product Expiration Date: 202207262359
Blood Product Expiration Date: 202207262359
Blood Product Expiration Date: 202207262359
Blood Product Expiration Date: 202207262359
Blood Product Expiration Date: 202207262359
Blood Product Expiration Date: 202207262359
Blood Product Expiration Date: 202207262359
Blood Product Expiration Date: 202207262359
Blood Product Expiration Date: 202207262359
Blood Product Expiration Date: 202207262359
ISSUE DATE / TIME: 202207211919
ISSUE DATE / TIME: 202207211919
ISSUE DATE / TIME: 202207211919
ISSUE DATE / TIME: 202207211919
ISSUE DATE / TIME: 202207211956
ISSUE DATE / TIME: 202207211956
ISSUE DATE / TIME: 202207211956
ISSUE DATE / TIME: 202207211956
ISSUE DATE / TIME: 202207212023
ISSUE DATE / TIME: 202207212023
ISSUE DATE / TIME: 202207212047
ISSUE DATE / TIME: 202207212047
ISSUE DATE / TIME: 202207212047
ISSUE DATE / TIME: 202207212047
ISSUE DATE / TIME: 202207212047
ISSUE DATE / TIME: 202207212047
ISSUE DATE / TIME: 202207220114
ISSUE DATE / TIME: 202207220114
ISSUE DATE / TIME: 202207220114
ISSUE DATE / TIME: 202207220114
Unit Type and Rh: 600
Unit Type and Rh: 600
Unit Type and Rh: 6200
Unit Type and Rh: 6200
Unit Type and Rh: 6200
Unit Type and Rh: 6200
Unit Type and Rh: 6200
Unit Type and Rh: 6200
Unit Type and Rh: 6200
Unit Type and Rh: 6200
Unit Type and Rh: 6200
Unit Type and Rh: 6200
Unit Type and Rh: 6200
Unit Type and Rh: 6200
Unit Type and Rh: 6200
Unit Type and Rh: 6200
Unit Type and Rh: 6200
Unit Type and Rh: 6200
Unit Type and Rh: 6200
Unit Type and Rh: 6200
Unit Type and Rh: 6200
Unit Type and Rh: 6200

## 2021-02-05 LAB — BPAM PLATELET PHERESIS
Blood Product Expiration Date: 202207222359
Blood Product Expiration Date: 202207232359
Blood Product Expiration Date: 202207242359
ISSUE DATE / TIME: 202207211959
ISSUE DATE / TIME: 202207212024
ISSUE DATE / TIME: 202207212053
Unit Type and Rh: 5100
Unit Type and Rh: 6200
Unit Type and Rh: 8400

## 2021-02-05 LAB — TYPE AND SCREEN
ABO/RH(D): A POS
Antibody Screen: NEGATIVE
Unit division: 0
Unit division: 0
Unit division: 0
Unit division: 0
Unit division: 0
Unit division: 0
Unit division: 0
Unit division: 0
Unit division: 0
Unit division: 0
Unit division: 0
Unit division: 0
Unit division: 0
Unit division: 0
Unit division: 0
Unit division: 0
Unit division: 0
Unit division: 0
Unit division: 0
Unit division: 0
Unit division: 0
Unit division: 0
Unit division: 0
Unit division: 0
Unit division: 0
Unit division: 0
Unit division: 0
Unit division: 0

## 2021-02-05 LAB — BPAM RBC
Blood Product Expiration Date: 202208152359
Blood Product Expiration Date: 202208152359
Blood Product Expiration Date: 202208162359
Blood Product Expiration Date: 202208162359
Blood Product Expiration Date: 202208162359
Blood Product Expiration Date: 202208162359
Blood Product Expiration Date: 202208162359
Blood Product Expiration Date: 202208172359
Blood Product Expiration Date: 202208172359
Blood Product Expiration Date: 202208172359
Blood Product Expiration Date: 202208172359
Blood Product Expiration Date: 202208172359
Blood Product Expiration Date: 202208172359
Blood Product Expiration Date: 202208172359
Blood Product Expiration Date: 202208172359
Blood Product Expiration Date: 202208172359
Blood Product Expiration Date: 202208172359
Blood Product Expiration Date: 202208172359
Blood Product Expiration Date: 202208172359
Blood Product Expiration Date: 202208172359
Blood Product Expiration Date: 202208172359
Blood Product Expiration Date: 202208172359
Blood Product Expiration Date: 202208172359
Blood Product Expiration Date: 202208172359
Blood Product Expiration Date: 202208172359
Blood Product Expiration Date: 202208172359
Blood Product Expiration Date: 202208172359
Blood Product Expiration Date: 202208182359
ISSUE DATE / TIME: 202207211410
ISSUE DATE / TIME: 202207211410
ISSUE DATE / TIME: 202207211917
ISSUE DATE / TIME: 202207211917
ISSUE DATE / TIME: 202207211917
ISSUE DATE / TIME: 202207211917
ISSUE DATE / TIME: 202207211956
ISSUE DATE / TIME: 202207211956
ISSUE DATE / TIME: 202207211956
ISSUE DATE / TIME: 202207211956
ISSUE DATE / TIME: 202207212023
ISSUE DATE / TIME: 202207212023
ISSUE DATE / TIME: 202207212023
ISSUE DATE / TIME: 202207212023
ISSUE DATE / TIME: 202207212023
ISSUE DATE / TIME: 202207212023
ISSUE DATE / TIME: 202207212050
ISSUE DATE / TIME: 202207212050
ISSUE DATE / TIME: 202207212050
ISSUE DATE / TIME: 202207212050
ISSUE DATE / TIME: 202207212052
ISSUE DATE / TIME: 202207212052
Unit Type and Rh: 6200
Unit Type and Rh: 6200
Unit Type and Rh: 6200
Unit Type and Rh: 6200
Unit Type and Rh: 6200
Unit Type and Rh: 6200
Unit Type and Rh: 6200
Unit Type and Rh: 6200
Unit Type and Rh: 6200
Unit Type and Rh: 6200
Unit Type and Rh: 6200
Unit Type and Rh: 6200
Unit Type and Rh: 6200
Unit Type and Rh: 6200
Unit Type and Rh: 6200
Unit Type and Rh: 6200
Unit Type and Rh: 6200
Unit Type and Rh: 6200
Unit Type and Rh: 6200
Unit Type and Rh: 6200
Unit Type and Rh: 6200
Unit Type and Rh: 6200
Unit Type and Rh: 6200
Unit Type and Rh: 6200
Unit Type and Rh: 6200
Unit Type and Rh: 6200
Unit Type and Rh: 6200
Unit Type and Rh: 6200

## 2021-02-05 LAB — PREPARE FRESH FROZEN PLASMA
Unit division: 0
Unit division: 0
Unit division: 0
Unit division: 0
Unit division: 0
Unit division: 0
Unit division: 0
Unit division: 0
Unit division: 0
Unit division: 0
Unit division: 0
Unit division: 0
Unit division: 0
Unit division: 0
Unit division: 0
Unit division: 0
Unit division: 0
Unit division: 0

## 2021-02-05 LAB — BPAM CRYOPRECIPITATE
Blood Product Expiration Date: 202207220210
Blood Product Expiration Date: 202207220210
Blood Product Expiration Date: 202207220250
Blood Product Expiration Date: 202207220250
ISSUE DATE / TIME: 202207212028
ISSUE DATE / TIME: 202207212028
ISSUE DATE / TIME: 202207212107
ISSUE DATE / TIME: 202207212107
Unit Type and Rh: 6200
Unit Type and Rh: 6200
Unit Type and Rh: 6200
Unit Type and Rh: 6200

## 2021-02-05 LAB — PREPARE CRYOPRECIPITATE
Unit division: 0
Unit division: 0
Unit division: 0
Unit division: 0

## 2021-02-06 LAB — URINE CULTURE: Culture: 1000 — AB

## 2021-02-07 LAB — CULTURE, BLOOD (SINGLE)
Special Requests: ADEQUATE
Special Requests: ADEQUATE

## 2021-02-08 NOTE — Discharge Summary (Signed)
    Physician Discharge Summary  Patient ID: Aaron Hawkins MRN: [id number] DOB/AGE: [birth year] 81 y.o.  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  Right axillary to bifemoral bypass with 8 mm ringed PTFE 2.  Ligation of left renal vein and IMV 3.  Harvest of left greater saphenous vein 4.  SMA interposition bypass with reversed greater saphenous vein 5.  Explantation of aortic endograft and ligation of bilateral common iliac arteries and aorta 6.  Primary repair duodenotomy 7.  Thrombectomy of SMA bypass with 3 [place]  Discharged Condition: death   Hospital Course: Patient was admitted through the emergency department with concern for infected aortic endograft.  He was planned for operative repair with axillary bifemoral bypass graft and subsequent aortic graft explantation and ligation.  He was taken to the operating room with this plan on 2021/02/25 underwent the axillary bifemoral bypass grafting uneventfully.  He then underwent aortic explantation which was complicated by significant blood loss and the patient was unstable throughout much of the case.  He was ultimately given temporary abdominal closure transfer to the ICU where he expired quickly.  Consults:  Treatment Team:  Waynetta Sandy, MD  Significant Diagnostic Studies: CBC CBC Latest Ref Rng & Units 25-Feb-2021 02/25/2021 2021-02-25  WBC 4.0 - 10.5 K/uL - - -  Hemoglobin 13.0 - 17.0 g/dL 6.5(LL) 5.4(LL) 5.8(LL)  Hematocrit 39.0 - 52.0 % 19.0(L) 16.0(L) 17.0(L)  Platelets 150 - 400 K/uL - - -     BMET   COAG Lab Results  Component Value Date   INR 1.1 01/17/2021   INR 1.2 01/22/2020   INR 1.1 01/14/2020   No results found for:  PTT  Disposition:    Allergies as of 02/05/2021       Reactions   Fluzone Quadrivalent [influenza Vac Split Quad] Other (See Comments)   GBS        Medication List     ASK your doctor about these medications    acetaminophen 500 MG tablet Commonly known as: TYLENOL Take 500 mg by mouth every 6 (six) hours as needed for moderate pain.   gabapentin 300 MG capsule Commonly known as: NEURONTIN Take 300 mg by mouth at bedtime.   Pfizer-BioNT COVID-19 Vac-TriS Susp injection Generic drug: COVID-19 mRNA Vac-TriS (Pfizer) Inject into the muscle.   PreserVision AREDS 2 Caps Take 1 capsule by mouth daily.   rosuvastatin 10 MG tablet Commonly known as: CRESTOR Take 1 tablet (10 mg total) by mouth daily.         Signed:  Eda Paschal. Donzetta Matters, MD [facility] Office: [phone] Pager: [phone]   02/08/2021, 2:14 PM

## 2021-02-08 NOTE — Addendum Note (Signed)
Addendum  created 02/08/21 0803 by Josephine Igo, CRNA   Order list changed, Pharmacy for encounter modified

## 2021-02-09 LAB — CULTURE, BLOOD (SINGLE)
Culture: NO GROWTH
Special Requests: ADEQUATE

## 2021-02-11 LAB — AEROBIC/ANAEROBIC CULTURE W GRAM STAIN (SURGICAL/DEEP WOUND)

## 2021-02-15 NOTE — Op Note (Signed)
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 Donzetta Matters, MD Assistants: Fortunato Curling, MD; Risa Grill, PA Procedure Performed: 1.  Right axillary to bifemoral bypass with 8 mm ringed PTFE 2.  Ligation of left renal vein and IMV 3.  Harvest of left greater saphenous vein 4.  SMA interposition bypass with reversed greater saphenous vein 5.  Explantation of aortic endograft and ligation of bilateral common iliac arteries and aorta 6.  Primary repair duodenotomy 7.  Thrombectomy of SMA bypass with 3 Fogarty  Indications: 80 year old male with history of endovascular aneurysm repair.  Subsequent to his aneurysm repair there was concern for endograft infection he underwent CT scan, nuclear medicine test and lab evaluation with no definitive answer.  He now presents 1 year after the endovascular aneurysm repair with clearly infected aneurysm by CT scan with persistent fevers and leukocytosis.  He is now indicated for extra-anatomic bypass and aortic explantation and ligation.  We have discussed the grim risks benefits and alternatives to the procedure he demonstrates good understanding he is willing to proceed.  Assistants were necessary due to the complex nature of this case  Findings: The axillary artery was healthy as were the bilateral common femoral arteries although they were ectatic.  After right axillary to bilateral common femoral bypasses there were palpable pulses at the feet.  We then proceeded with aortic explantation with plans for aortic ligation.  During initial evaluation the duodenum was densely adherent to the aorta.  There was significant retroperitoneal scarring and dense fibrotic tissue overlying the aorta making exposure exceedingly difficult.  The SMA was injured during exposure we attempted primary  repair ultimately we performed bypass with reverse greater saphenous harvested from the left thigh.  At completion this had thrombosed this required thrombectomy after that there was strong pulse distal in the SMA.  In attempting to further expose the neck of the aneurysm a hole was made in the right lateral aspect of the aneurysm just at the neck of the graft this required supraceliac clamping as well as distal clamping of the graft.  There was clearly an aortoduodenal fistula which was exposed and the duodenum was primarily repaired in 2 layers with Vicryl suture.  We were ultimately able to open the aorta from within and oversewed the distal aorta with 3-0 Prolene suture.  Distally we had trouble getting control of the bilateral common iliac arteries they 2 were oversewn with 3-0 Prolene suture.  There was also an injury to the right common iliac vein which was repaired with interrupted 3-0 Prolene suture as well.  At completion the small intestine did appear viable however the transverse colon appeared somewhat dusky.  If there was a previous right colectomy with ileocolic anastomosis which was patent.  Patient was an extremis upon completion and transfer to the ICU for further monitoring.   Procedure:  The patient was identified in the holding area and taken to the operating room where is placed supine operative table and general anesthesia was induced.  He was sterilely prepped and draped in the bilateral neck chest abdomen bilateral groins in the usual fashion.  Antibiotics were up-to-date with plans for redosed throughout the operation.  Timeout was called.  We began with left groin incision overlying the common femoral pulse.  We dissected down to the common femoral artery.  We identified the profunda.  We  placed a vessel loop around the common femoral only this was intact.  Similarly in the right groin made a transverse incision dissected on the common femoral pulse.  This also was ectatic.  We placed a  vessel loop around this.  Below the right clavicle we made a transverse incision.  We dissected down through the pectoralis fascia.  The muscle was split.  We dissected down to the fat pad this was divided.  We identified the pectoralis minor lateral to our exposure.  We identified the axillary vein branches were divided between ties.  The axillary artery was identified and a vessel loop was placed around this.  We then placed a Gore tunneler from the right groin in a subcutaneous fashion posterior to the pectoralis minor muscle up into the infraclavicular incision.  We then crossed from both groin incisions with a uterine dressing clamp.  We placed an umbilical tape through and through to the incisions.  The patient was then fully heparinized.  We began by clamping her axillary artery distally and proximally opened longitudinally.  We trimmed the graft to size and sewed end-to-side with 6-0 Prolene suture.  Upon completion we then flushed through the graft.  The graft was clamped and the groin incision.  We clamped the common femoral artery distally and proximally opened longitudinally.  The graft was trimmed to size sewn end to side with 6-0 Prolene suture.  Prior completion we de-aired from all directions.  We then a very strong pulse in our graft going into our common femoral artery.  In the groin we then reclamped all of our vessels.  A separate portion of the millimeters ringed PTFE was tunneled between the 2 groin incisions.  We then opened our previous graft longitudinally we trimmed the new graft and sewed it into side onto the hood with 6-0 Prolene suture.  Again prior to completion we de-aired in all directions.  We then flushed through to the left groin from the right.  We filled the graft with heparinized saline.  We reclamped the graft.  We clamped the left common femoral artery distal and proximal open longitudinally.  We then turned the graft to size sewn in end-to-side with 6-0 Prolene suture and  again flushed in all directions prior to completion.  Upon completion there is very strong pulse throughout the graft in both groins.  We administered 50 mg of protamine.  We obtain hemostasis and irrigated all wounds and closed in layers with Vicryl and Monocryl.  Dermabond placed to level the skin and these incisions were then covered with Ioban after the Dermabond dried.  Attention was then turned to the abdomen.  A standard midline incision was made to the right of the umbilicus.  We dissected down through the skin and subcutaneous tissue the midline of the fascia.  The fascia was opened for the entire to the incision.  We identified the NG tube to be in place.  There were significant adhesions there was a previous midline mesh from a previous hernia repair.  We did take down several adhesions from the omentum.  We identified what appeared to be an ileocolic anastomosis with an absent right colon.  We examined the small bowel this was densely adherent to the anterior aspect of our aneurysm.  We attempted to initially take down the duodenum off of the aneurysm but this was too adherent.  We then elected to attempt proximal control.  We dissected up high on the aneurysm.  We did not initially  not identify renal vein.  Unfortunately we injure the SMA.  This was attempted to be repaired primarily with 6-0 Prolene suture but there was too much tension.  Attention was then turned to the left thigh which had previously been prepped and draped.  Ultrasound was used to identify the greater saphenous vein in the longitudinal incision was made.  The vein was dissected out for approximately 6 cm clamped distally proximally and transected and tied off in both directions.  We then transected the SMA.  We performed limited endarterectomy of the proximal SMA.  We then reversed the vein spatulated sewn end to end with 6-0 Prolene suture.  After this we released our clamp on the SMA there was pulsatile flow through the vein  graft.  We had mobilized the distal SMA for several centimeters including dividing the first jejunal branch.  We good backbleeding from the SMA.  When the SMA was clamped we did administer 3000 units of heparin.  We trimmed the vein to good size did not have too much redundancy we spatulated and sewn end-to-end to the distal SMA with 6-0 Prolene suture.  Upon completion we had good flow throughout the M SMA confirmed with Doppler and there was a strong pulse.  We turned our attention back towards aortic exposure.  We identified the left renal vein and this was divided.  We attempted to get around the neck of the aneurysm unfortunately we had torrential bleeding from the right lateral sidewall of the aneurysm where the graft was grossly exposed.  We are able to get a finger in the exposure site.  We then brought the transverse colon down moved cephalad.  The stomach was moved laterally the crura of the diaphragm were identified.  We dissected back to the aorta and placed the supraceliac clamp.  Unfortunately we still had backbleeding.  At this time while we still controlled with finger control of the bleeding from the aorta we dissected the duodenum off of the graft.  There was a large hole in the duodenum which enteric spillage.  The graft was then primarily clamped inside of the aneurysm and this did control the bleeding.  The duodenum was quickly oversewn with running 3-0 Vicryl suture.  Later we added a second layer with interrupted 3-0 Vicryl suture.  With this we then open the aneurysm up to the level of the injury.  We had pretty good control at this point.  We then oversewed the proximal aorta just below the renal arteries with 3-0 Prolene suture in a mattress fashion.  We then turned our attention distally.  We did have clamps on the graft ultimately we had to move down to the bilateral common iliac arteries which were densely adherent.  We were never able to fully get around both common iliac arteries we  did have an injury to the right common iliac vein which was repaired with 3-0 Prolene suture.  Ultimately we were able to clamp the right common iliac artery and we oversewed this with 3-0 Prolene suture in a mattress fashion.  We turned our attention to the left side.  On that side we were able to identify the ureter and protected.  Ultimately we were able to remove the entirety of the graft and oversewed the left common iliac artery.  There was significant bleeding and blood loss during the repair of both iliac arteries requiring significant transfusion.  After this it was noted that the transverse colon was significantly dusky and the SMA bypass  appeared to be thrombosed.  We clamped it proximally and distally.  We elected not to give heparin given the patient's significant blood loss.  A transverse vein graftotomy was performed.  A 3 Fogarty was passed distally we did not return a clot we did have good backbleeding.  3 Fogarty was then passed proximally we had very strong inflow.  We then reclamped the bypass graft repaired with 6-0 Prolene suture.  There was a small area of the proximal anastomosis which was also repaired with 6-0 Prolene suture.  At this time there was pulsatile flow in the SMA.  We elected to pack the abdomen with 6 lap pads.  We then placed an ABThera wound VAC in place this to suction.  The patient was then transferred to a bed remained intubated with plans for critical care in the ICU.  All counts except the lap pads were correct at completion.   EBL: 14800cc vein  Transfusion: per anesthesia record  Specimen: Aerobic and anaerobic cultures  Kolten Ryback C. Donzetta Matters, MD [facility] Office: [phone] Pager: [phone]

## 2021-02-15 NOTE — Anesthesia Procedure Notes (Signed)
Central Venous Catheter Insertion Performed by: Roderic Palau, MD, anesthesiologist Start/End08/08/22 11:55 AM, 2021-02-22 12:10 AM Patient location: Pre-op. Preanesthetic checklist: patient identified, IV checked, site marked, risks and benefits discussed, surgical consent, monitors and equipment checked, pre-op evaluation, timeout performed and anesthesia consent Position: Trendelenburg Lidocaine 1% used for infiltration and patient sedated Hand hygiene performed , maximum sterile barriers used  and Seldinger technique used Catheter size: 8.5 Fr Total catheter length 10. Central line was placed.Sheath introducer PA Cath depth:50 Procedure performed using ultrasound guided technique. Ultrasound Notes:anatomy identified, needle tip was noted to be adjacent to the nerve/plexus identified, no ultrasound evidence of intravascular and/or intraneural injection and image(s) printed for medical record Attempts: 1 Following insertion, line sutured, dressing applied and Biopatch. Post procedure assessment: blood return through all ports, free fluid flow and no air  Patient tolerated the procedure well with no immediate complications.

## 2021-02-15 NOTE — Progress Notes (Signed)
Patient came back from OR coding. Coded in the room. 2H Staff, OR staff, and Doctor  in the room. At 10-18-47, code was called by Dr. Monica Martinez. Time of Death 2155/10/18. Verified by two Rn's.

## 2021-02-15 NOTE — Anesthesia Postprocedure Evaluation (Signed)
Anesthesia Post Note  Patient: Aaron Hawkins  Procedure(s) Performed: AXILLARY BIFEMORAL BYPASS GRAFT; AORTIC LIGATION (Abdomen) REMOVAL OF ABDOMINAL AORTIC STENT GRAFT (Abdomen) APPLICATION OF ABDOMINAL WOUND VAC (Abdomen)     Patient location during evaluation: SICU Anesthesia Type: General Level of consciousness: sedated Pain management: pain level controlled Vital Signs Assessment: vitals unstable Respiratory status: patient remains intubated per anesthesia plan Cardiovascular status: unstable and tachycardic Postop Assessment: no apparent nausea or vomiting Anesthetic complications: no   No notable events documented.               Fern Asmar DANIEL

## 2021-02-15 NOTE — Anesthesia Preprocedure Evaluation (Addendum)
Anesthesia Evaluation  Patient identified by MRN, date of birth, ID band Patient awake    Reviewed: Allergy & Precautions, H&P , NPO status , Patient's Chart, lab work & pertinent test results  Airway Mallampati: II  TM Distance: >3 FB Neck ROM: Full    Dental no notable dental hx. (+) Teeth Intact, Dental Advisory Given   Pulmonary neg pulmonary ROS, former smoker,    Pulmonary exam normal breath sounds clear to auscultation       Cardiovascular + CAD and + Peripheral Vascular Disease   Rhythm:Regular Rate:Normal     Neuro/Psych negative neurological ROS  negative psych ROS   GI/Hepatic Neg liver ROS, hiatal hernia, PUD,   Endo/Other  negative endocrine ROS  Renal/GU negative Renal ROS  negative genitourinary   Musculoskeletal  (+) Arthritis , Osteoarthritis,    Abdominal   Peds  Hematology  (+) Blood dyscrasia, anemia ,   Anesthesia Other Findings   Reproductive/Obstetrics negative OB ROS                            Anesthesia Physical Anesthesia Plan  ASA: 3  Anesthesia Plan: General   Post-op Pain Management:    Induction: Intravenous  PONV Risk Score and Plan: 3 and Ondansetron, Dexamethasone and Midazolam  Airway Management Planned: Oral ETT  Additional Equipment: Arterial line, CVP and Ultrasound Guidance Line Placement  Intra-op Plan:   Post-operative Plan: Extubation in OR and Possible Post-op intubation/ventilation  Informed Consent: I have reviewed the patients History and Physical, chart, labs and discussed the procedure including the risks, benefits and alternatives for the proposed anesthesia with the patient or authorized representative who has indicated his/her understanding and acceptance.     Dental advisory given  Plan Discussed with: CRNA  Anesthesia Plan Comments:         Anesthesia Quick Evaluation

## 2021-02-15 NOTE — Anesthesia Procedure Notes (Addendum)
Procedure Name: Intubation Date/Time: 21-Feb-2021 2:06 PM Performed by: Merryl Hacker, RN Pre-anesthesia Checklist: Patient identified, Emergency Drugs available, Suction available and Patient being monitored Patient Re-evaluated:Patient Re-evaluated prior to induction Oxygen Delivery Method: Circle System Utilized Preoxygenation: Pre-oxygenation with 100% oxygen Induction Type: IV induction Ventilation: Mask ventilation without difficulty Laryngoscope Size: Mac and 4 Grade View: Grade I Tube type: Oral Tube size: 8.0 mm Number of attempts: 2 Airway Equipment and Method: Stylet Placement Confirmation: ETT inserted through vocal cords under direct vision, positive ETCO2 and breath sounds checked- equal and bilateral Secured at: 22 cm Tube secured with: Tape Dental Injury: Teeth and Oropharynx as per pre-operative assessment  Comments: Performed by Heide Scales, SRNA under direct supervision by Dr. Ola Spurr and Junie Bame, CRNA DLx1 grade 1 view cords shut when attempting to pass ETT; easy mask ventilation with agent; DLx1 grade 1 view, 8.0ETT passed easily.  No changes to soft tissue or dentition.

## 2021-02-15 NOTE — Progress Notes (Signed)
  Progress Note    2021/02/05 7:53 AM * No surgery date entered *  Subjective:  feeling much better this morning  Vitals:   02-05-2021 0607 02/05/21 0732  BP: 134/68 132/82  Pulse: 86 89  Resp: 18 18  Temp: (!) 100.9 F (38.3 C) (!) 100.9 F (38.3 C)  SpO2: 92% 92%    Physical Exam: Awake alert oriented Nonlabored respirations Abdomen is soft and nontender Palpable bilateral radial 2+ and bilateral posterior tibial 2+ pulses  CBC    Component Value Date/Time   WBC 13.1 (H) 01/26/2021 2003   RBC 4.84 01/28/2021 2003   HGB 12.6 (L) 02/01/2021 2003   HGB 13.4 01/20/2021 1038   HGB 16.9 03/30/2011 1349   HCT 40.3 01/19/2021 2003   HCT 48.6 03/30/2011 1349   PLT 182 01/22/2021 2003   PLT 232 01/20/2021 1038   PLT 167 03/30/2011 1349   MCV 83.3 02/14/2021 2003   MCV 91.2 03/30/2011 1349   MCH 26.0 02/01/2021 2003   MCHC 31.3 01/23/2021 2003   RDW 18.2 (H) 01/31/2021 2003   RDW 13.8 03/30/2011 1349   LYMPHSABS 0.6 (L) 02/08/2021 1300   LYMPHSABS 1.9 03/30/2011 1349   MONOABS 0.7 01/23/2021 1300   MONOABS 0.5 03/30/2011 1349   EOSABS 0.0 01/21/2021 1300   EOSABS 0.1 03/30/2011 1349   BASOSABS 0.0 02/09/2021 1300   BASOSABS 0.0 03/30/2011 1349    BMET    Component Value Date/Time   NA 132 (L) 01/24/2021 2003   K 4.1 01/29/2021 2003   CL 99 01/27/2021 2003   CO2 26 01/15/2021 2003   GLUCOSE 106 (H) 02/06/2021 2003   BUN 13 01/28/2021 2003   CREATININE 0.95 01/21/2021 2003   CREATININE 1.25 (H) 01/20/2021 1038   CALCIUM 7.8 (L) 01/27/2021 2003   GFRNONAA >60 01/16/2021 2003   GFRNONAA 58 (L) 01/20/2021 1038   GFRAA >60 04/07/2020 1132   GFRAA >60 03/20/2020 0930    INR    Component Value Date/Time   INR 1.1 02/12/2021 1300     Intake/Output Summary (Last 24 hours) at 02/05/21 0753 Last data filed at 2021/02/05 0546 Gross per 24 hour  Intake 1736.55 ml  Output --  Net 1736.55 ml     Assessment:  80 y.o. male is here with infected aortic  endograft  Plan: We have again discussed his options being surgical versus palliative.  Surgical options include extra-anatomic bypass versus inline options and he has deferred to me for surgical decision-making.  I discussed with him the plan for right axillary to bifemoral bypass followed by aortic graft explantation with aortic ligation.  We have again discussed the grim prognosis of this operation up to including death with very high risk for blood transfusion, renal failure, cardiopulmonary insufficiency possibly requiring prolonged intubation, need for prolonged hospitalization possible need for rehab.  Patient demonstrates good understanding and agrees to proceed today   Aaron Spranger C. Donzetta Matters, MD [facility] Office: [phone] Pager: [phone]  02-05-21 7:53 AM

## 2021-02-15 NOTE — Progress Notes (Signed)
PHARMACY - PHYSICIAN COMMUNICATION CRITICAL VALUE ALERT - BLOOD CULTURE IDENTIFICATION (BCID)  Aaron Hawkins is an 80 y.o. male who presented to [facility] on 02/11/2021 with a chief complaint of infected EVAR   Assessment:  25 YOM with infected aortic endograft currently in the OR with [facility] for planned bypass and graft explantation and aortic ligation. Now with 1 of 4 blood cultures growing GPC in clusters with BCID detecting staph epi and strep species - both likely contaminants since only in 1 bottle.   Name of physician (or Provider) Contacted: Donzetta Matters  Current antibiotics: Vancomycin + Zosyn  Changes to prescribed antibiotics recommended:  None - follow-up additional culture results  Results for orders placed or performed during the hospital encounter of 02/02/2021  Blood Culture ID Panel (Reflexed) (Collected: 02/09/2021  2:00 PM)  Result Value Ref Range   Enterococcus faecalis NOT DETECTED NOT DETECTED   Enterococcus Faecium NOT DETECTED NOT DETECTED   Listeria monocytogenes NOT DETECTED NOT DETECTED   Staphylococcus species DETECTED (A) NOT DETECTED   Staphylococcus aureus (BCID) NOT DETECTED NOT DETECTED   Staphylococcus epidermidis DETECTED (A) NOT DETECTED   Staphylococcus lugdunensis NOT DETECTED NOT DETECTED   Streptococcus species DETECTED (A) NOT DETECTED   Streptococcus agalactiae NOT DETECTED NOT DETECTED   Streptococcus pneumoniae NOT DETECTED NOT DETECTED   Streptococcus pyogenes NOT DETECTED NOT DETECTED   A.calcoaceticus-baumannii NOT DETECTED NOT DETECTED   Bacteroides fragilis NOT DETECTED NOT DETECTED   Enterobacterales NOT DETECTED NOT DETECTED   Enterobacter cloacae complex NOT DETECTED NOT DETECTED   Escherichia coli NOT DETECTED NOT DETECTED   Klebsiella aerogenes NOT DETECTED NOT DETECTED   Klebsiella oxytoca NOT DETECTED NOT DETECTED   Klebsiella pneumoniae NOT DETECTED NOT DETECTED   Proteus species NOT DETECTED NOT DETECTED   Salmonella species NOT  DETECTED NOT DETECTED   Serratia marcescens NOT DETECTED NOT DETECTED   Haemophilus influenzae NOT DETECTED NOT DETECTED   Neisseria meningitidis NOT DETECTED NOT DETECTED   Pseudomonas aeruginosa NOT DETECTED NOT DETECTED   Stenotrophomonas maltophilia NOT DETECTED NOT DETECTED   Candida albicans NOT DETECTED NOT DETECTED   Candida auris NOT DETECTED NOT DETECTED   Candida glabrata NOT DETECTED NOT DETECTED   Candida krusei NOT DETECTED NOT DETECTED   Candida parapsilosis NOT DETECTED NOT DETECTED   Candida tropicalis NOT DETECTED NOT DETECTED   Cryptococcus neoformans/gattii NOT DETECTED NOT DETECTED   Methicillin resistance mecA/C NOT DETECTED NOT DETECTED    Thank you for allowing pharmacy to be a part of this patient's care.  Alycia Rossetti, PharmD, BCPS Clinical Pharmacist Clinical phone for February 22, 2021: [phone] Feb 22, 2021 6:48 PM   **Pharmacist phone directory can now be found on [email or web address] (PW [email or web address]).  Listed under [place].

## 2021-02-15 NOTE — Anesthesia Procedure Notes (Signed)
Arterial Line Insertion Start/End12-Aug-2022 12:00 PM, 2021/02/26 12:05 PM Performed by: Rande Brunt, CRNA  Patient location: Pre-op. Preanesthetic checklist: patient identified, IV checked, site marked, risks and benefits discussed, surgical consent, monitors and equipment checked, pre-op evaluation, timeout performed and anesthesia consent Lidocaine 1% used for infiltration Left, radial was placed Catheter size: 20 Fr Hand hygiene performed , maximum sterile barriers used  and Seldinger technique used Allen's test indicative of satisfactory collateral circulation Attempts: 1 Procedure performed without using ultrasound guided technique. Following insertion, dressing applied. Post procedure assessment: normal and unchanged  Patient tolerated the procedure well with no immediate complications. Additional procedure comments: Performed by Heide Scales, SRNA under direct supervision by Rhae Lerner, CRNA.

## 2021-02-15 NOTE — Transfer of Care (Signed)
Immediate Anesthesia Transfer of Care Note  Patient: BUB FALKOWITZ  Procedure(s) Performed: AXILLARY BIFEMORAL BYPASS GRAFT; AORTIC LIGATION (Abdomen) REMOVAL OF ABDOMINAL AORTIC STENT GRAFT (Abdomen) APPLICATION OF ABDOMINAL WOUND VAC (Abdomen)  Patient Location: ICU  Anesthesia Type:General  Level of Consciousness: Patient remains intubated per anesthesia plan  Airway & Oxygen Therapy: Patient remains intubated per anesthesia plan and Patient placed on Ventilator (see vital sign flow sheet for setting)  Post-op Assessment: Post -op Vital signs reviewed and unstable, Anesthesiologist notified  Post vital signs: unstable  Last Vitals:  Singer MD and this CRNA, [place], Oceanographer and OR team transported patient to [facility] ICU while actively chemically coding patient with Epi/calcium chloride/sodium bicarb, IV fluids via pressure bag, 44 blood products given during procedure, while on epi/neosynephrine gtts. Upon arrival to ICU, patient noted to enter v-tach, compressions started, Clark MD entered room at 2146. Code ended per MD. Family brought to ICU for visitation.   Blood products given during procedure:  PRBCs - 22 FFP - 16 Cryo - 4 Plt - 2  IV fluids (LR) - 6000 ml Albumin - 1250 ml  EBL - 12,000 ml

## 2021-02-15 DEATH — deceased

## 2021-02-20 MED FILL — Medication: Qty: 1 | Status: AC

## 2021-03-11 IMAGING — CT CT CTA ABD/PEL W/CM AND/OR W/O CM
2 of 10 series · 11 of 46 positions shown, 13 images · IV contrast (iopamidol)
Comparison: 10/31/2019

CLINICAL DATA: Recent aortic stent graft placement. Low abdominal
pain.

EXAM:
CTA ABDOMEN AND PELVIS WITHOUT AND WITH CONTRAST
TECHNIQUE: Multidetector CT imaging of the abdomen and pelvis was performed
using the standard protocol during bolus administration of
intravenous contrast. Multiplanar reconstructed images and MIPs were
obtained and reviewed to evaluate the vascular anatomy.
CONTRAST:  75mL [id number] IOPAMIDOL ([id number]) INJECTION 76%

[Series 7: cta arterial 2.00 bv36 s3 cor art st · axial · arterial · 0.53mm/px · z∈[+1215,+1583]mm · 9 of 208 slices shown, 11 images (1 of 2)]
[im 12/208  soft-tissue]
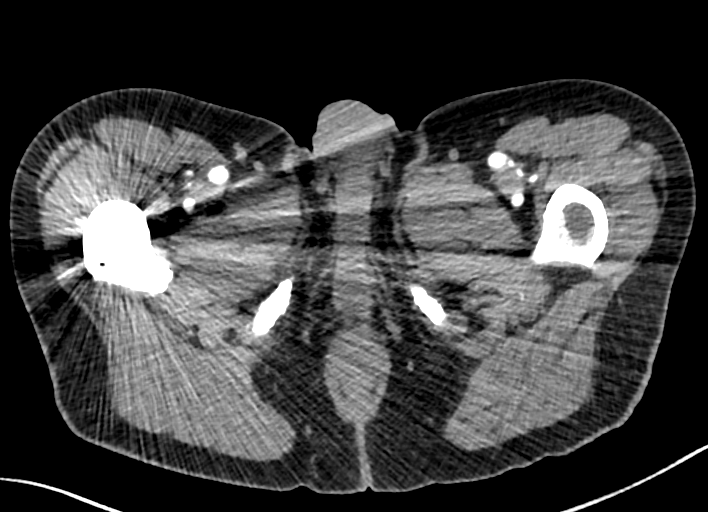
[im 12/208  bone]
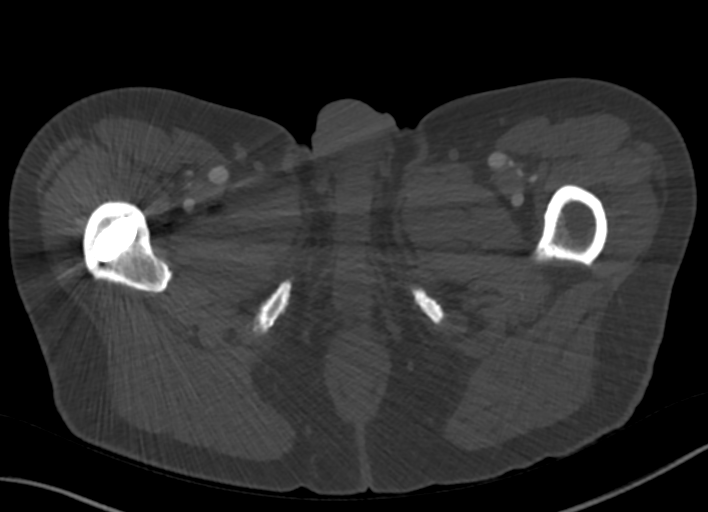
[im 35/208  soft-tissue]
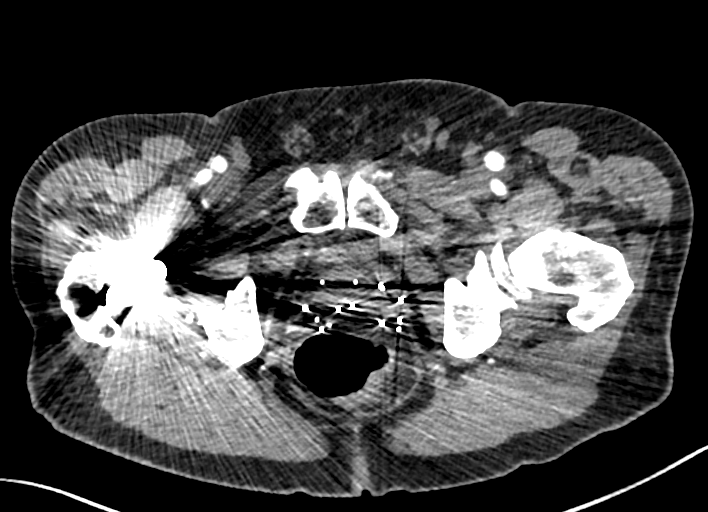
[im 58/208  soft-tissue]
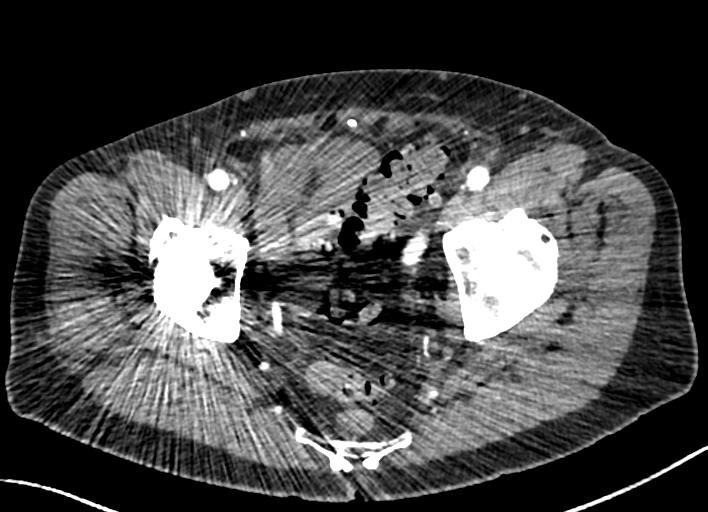
[im 81/208  soft-tissue]
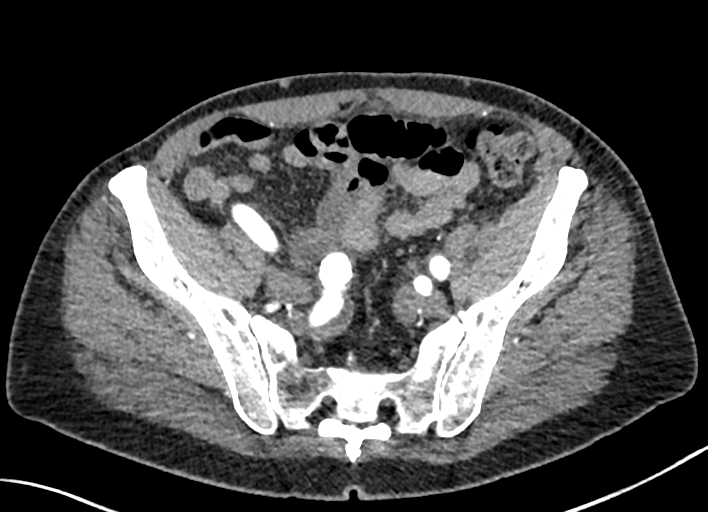
[im 104/208  soft-tissue]
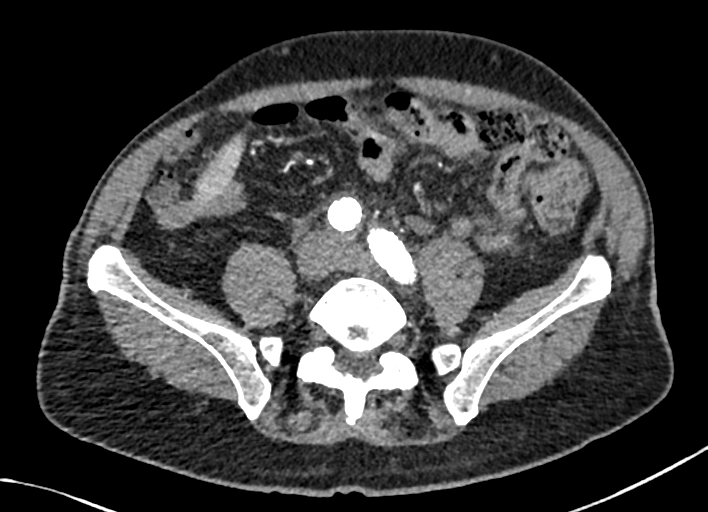
[im 127/208  soft-tissue]
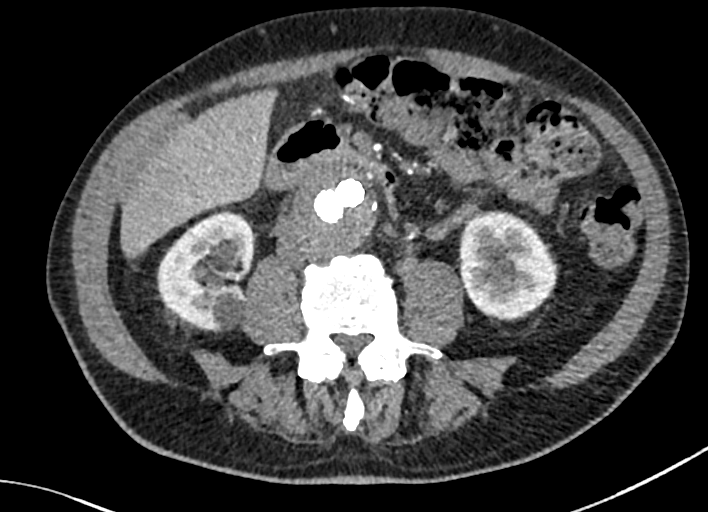
[im 150/208  soft-tissue]
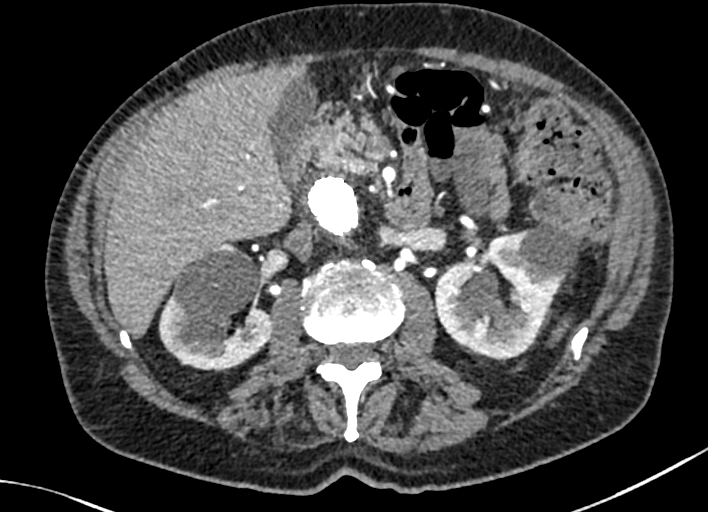
[im 173/208  soft-tissue]
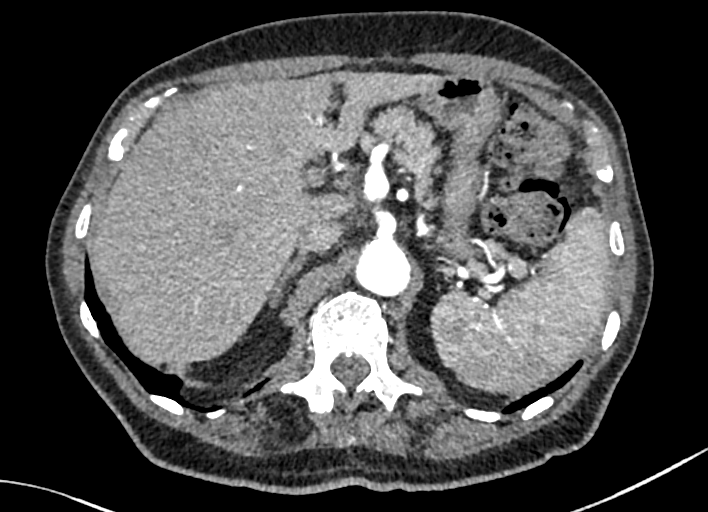
[im 196/208  soft-tissue]
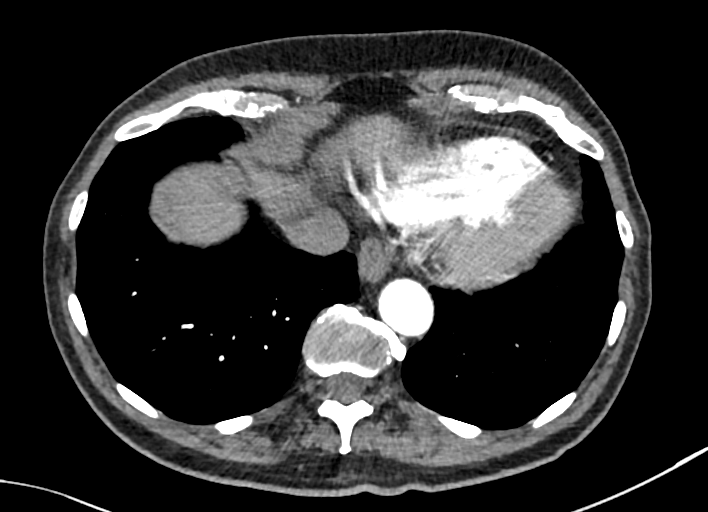
[im 196/208  bone]
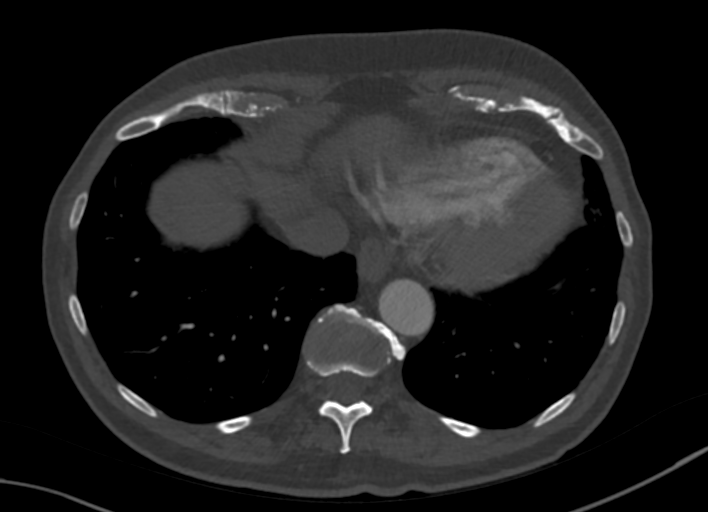

[Series 9: cta arterial 2.00 bv36 s3 cor art st · coronal · arterial · 0.74mm/px · 2 of 134 slices shown (2 of 2)]
[im 45/134  soft-tissue]
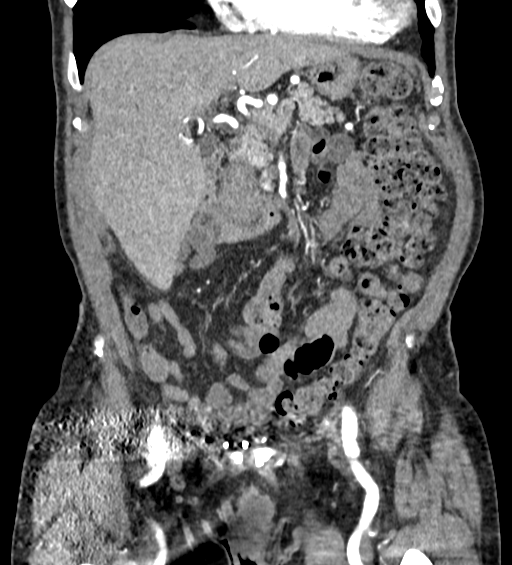
[im 89/134  soft-tissue]
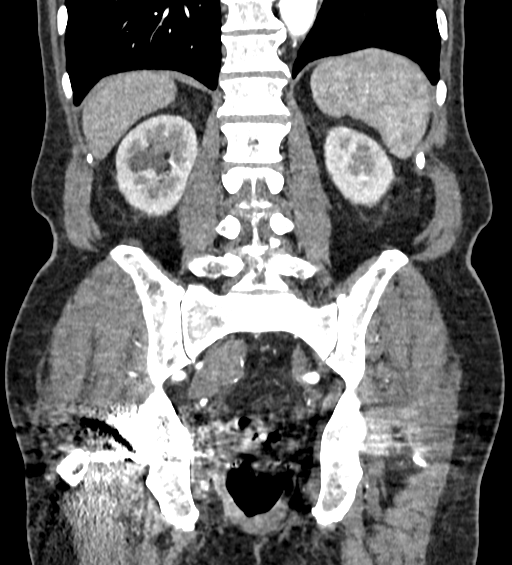

[11 of 46 positions shown; findings below may reference images not displayed]

FINDINGS: VASCULAR

Aorta: Mild calcified atheromatous plaque. Interval placement of
infrarenal bifurcated stent graft, which remains patent. Native sac
diameter of 5.6 x 4.8 cm (previously 5.7 cm by my measurement). No
evidence of endoleak.

Celiac: Calcified ostial plaque without high-grade stenosis,
tortuous distally. Nonocclusive linear filling defect in the distal
celiac axis just proximal to its bifurcation as before. Maximum
diameter 1.3 cm, stable. Distal branches unremarkable.

SMA: Patent. Replaced right hepatic arterial supply, an anatomic
variant.

Renals: Single left, widely patent.  Single right, widely patent.

IMA: Short segment origin occlusion, reconstituted distally by
visceral collaterals.

Inflow: The right limb of the stent graft extends to the common
iliac bifurcation. 2.2 cm fusiform dilatation of internal iliac
artery containing eccentric mural thrombus. External iliac is
ectatic, mildly tortuous.

Left limb of the stent graft extends to common iliac bifurcation.
Internal and external iliac arteries are mildly tortuous and
ectatic. No dissection or stenosis.

Proximal Outflow: Bilateral common femoral and visualized portions
of the superficial and profunda femoral arteries are patent without
evidence of aneurysm, dissection, vasculitis or significant
stenosis.

Veins: Patent visualized portions of hepatic veins, portal vein,
splenic vein, bilateral renal veins, IVC, and visualized portions of
iliac venous system. No venous pathology evident.

Review of the MIP images confirms the above findings.

NON-VASCULAR

Lower chest: No acute abnormality.

Hepatobiliary: No focal liver abnormality is seen. No gallstones,
gallbladder wall thickening, or biliary dilatation.

Pancreas: Unremarkable. No pancreatic ductal dilatation or
surrounding inflammatory changes.

Spleen: Normal in size without focal abnormality.

Adrenals/Urinary Tract: Adrenal glands unremarkable. Bilateral renal
cyst stable. No hydronephrosis. Urinary bladder is nondistended.

Stomach/Bowel: Stomach is nondistended. Small bowel decompressed.
Changes of left hemicolectomy. Scattered distal descending and
sigmoid diverticula without definite adjacent inflammatory change or
abscess.

Lymphatic: No abdominal or pelvic adenopathy.

Reproductive: Metallic seeds around the prostate.

Other: No ascites.  No free air.

Musculoskeletal: Anterior pelvic laparoscopic hernia mesh sutures.
Lower lumbar spondylitic changes. Right hip arthroplasty hardware,
incompletely visualized. Advanced left hip DJD. No fracture or
worrisome bone lesion.
IMPRESSION: 1. Interval infrarenal bifurcated aortic stent graft placement,
patent.
No endoleak.
Native sac diameter 5.6 cm (previously 5.7 cm).
2. Stable 2.2 cm fusiform dilatation of right internal iliac artery.
3. Descending and sigmoid diverticulosis.

Aortic Atherosclerosis ([id number]).

## 2021-04-30 ENCOUNTER — Other Ambulatory Visit (HOSPITAL_COMMUNITY): Payer: Self-pay

## 2021-05-03 ENCOUNTER — Other Ambulatory Visit: Payer: Medicare Other

## 2021-05-05 ENCOUNTER — Ambulatory Visit: Payer: Medicare Other | Admitting: Hematology

## 2021-05-11 IMAGING — CT CT ABD-PELV W/ CM
2 of 5 series · 15 of 46 positions shown, 17 images · IV contrast (Omnipaque)
Comparison: 02/16/2020

CLINICAL DATA: Abdominal pain and fever.  Possible abdominal mass.

EXAM:
CT ABDOMEN AND PELVIS WITH CONTRAST
TECHNIQUE: Multidetector CT imaging of the abdomen and pelvis was performed
using the standard protocol following bolus administration of
intravenous contrast.
CONTRAST:  100mL OMNIPAQUE IOHEXOL 300 MG/ML  SOLN

[Series 2: axial st · axial · 0.83mm/px · z∈[-558,-108]mm · 12 of 100 slices shown, 14 images]
[im 5/100  soft-tissue]
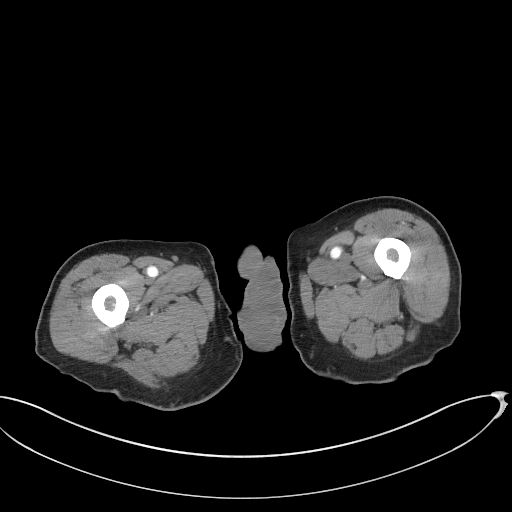
[im 5/100  bone]
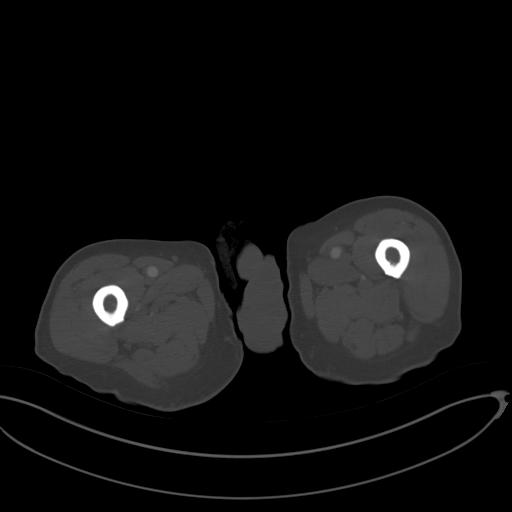
[im 15/100  soft-tissue]
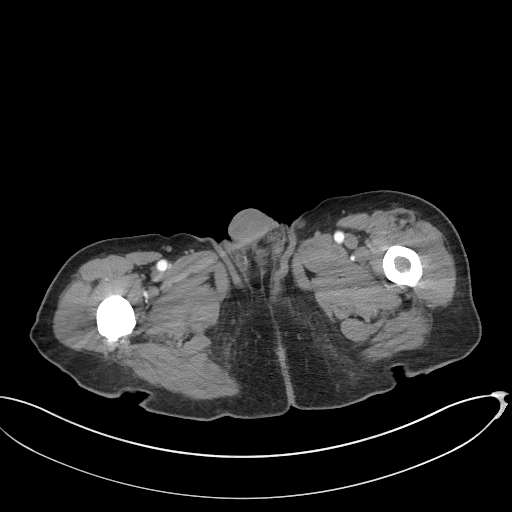
[im 24/100  soft-tissue]
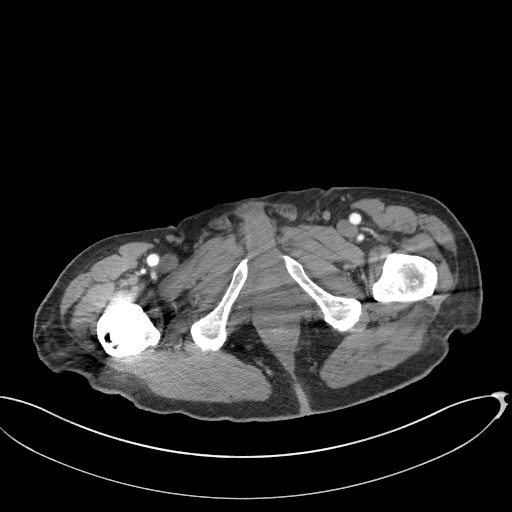
[im 29/100  soft-tissue]
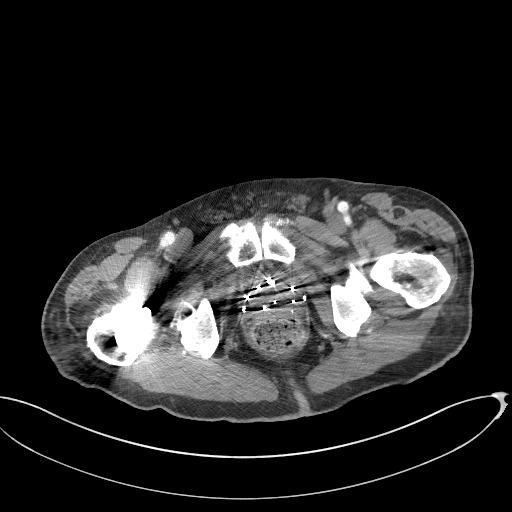
[im 38/100  soft-tissue]
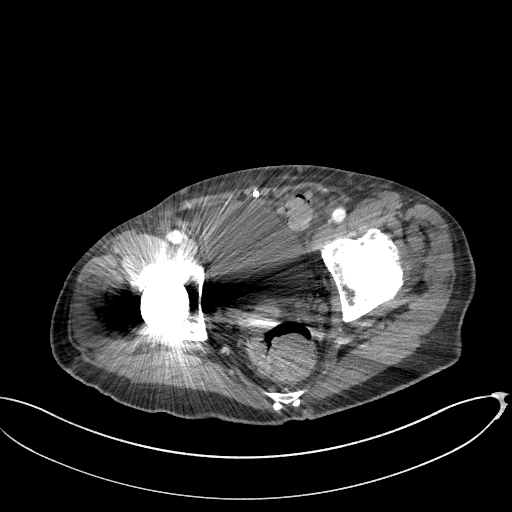
[im 48/100  soft-tissue]
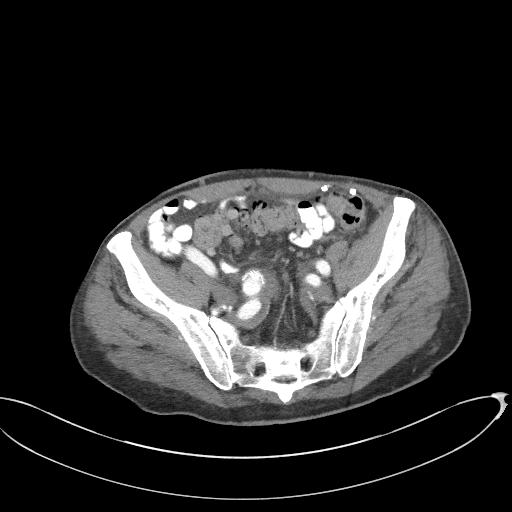
[im 52/100  soft-tissue]
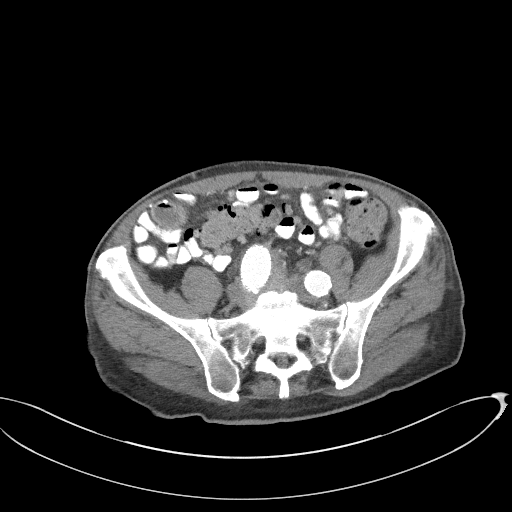
[im 62/100  soft-tissue]
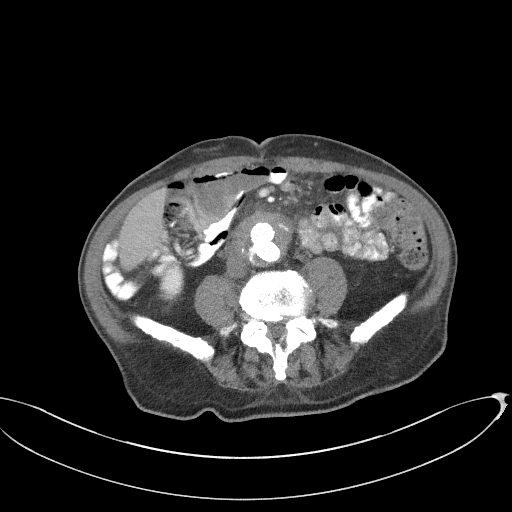
[im 71/100  soft-tissue]
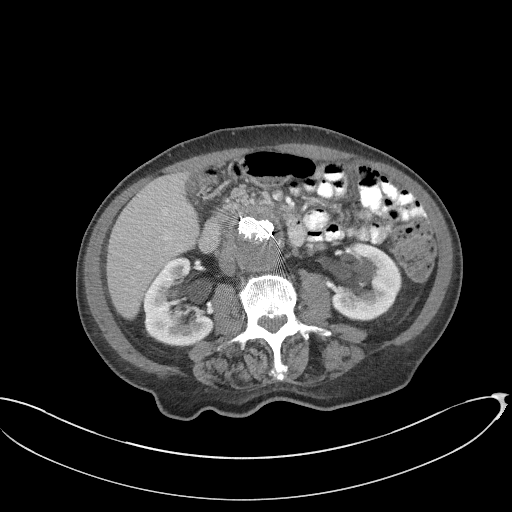
[im 71/100  bone]
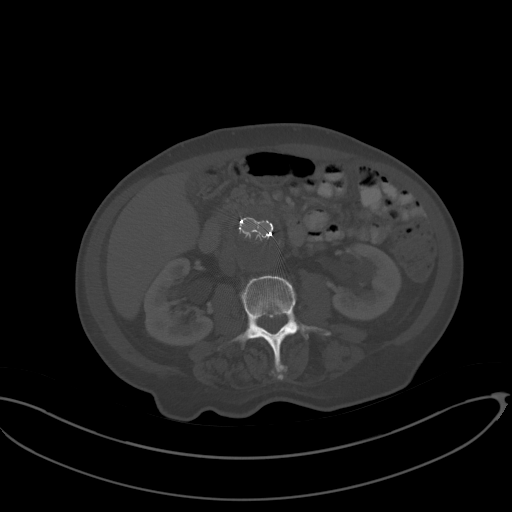
[im 76/100  soft-tissue]
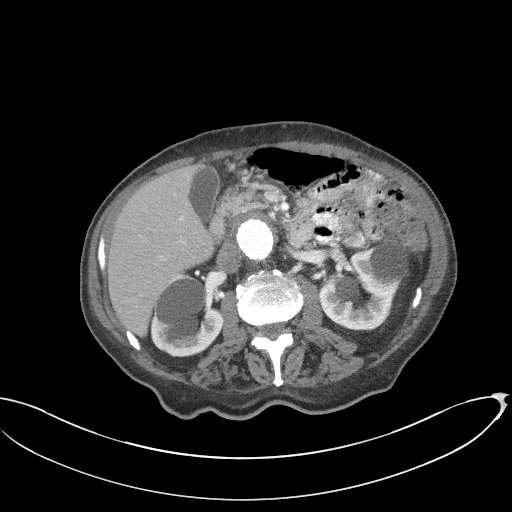
[im 85/100  soft-tissue]
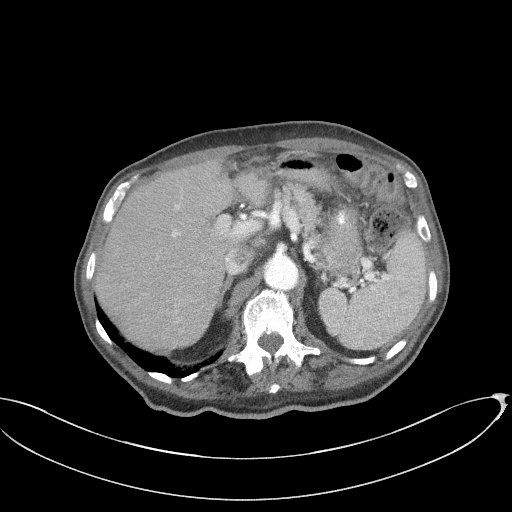
[im 95/100  soft-tissue]
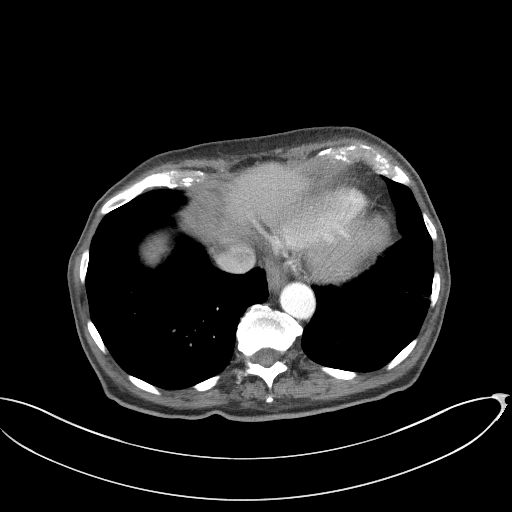

[Series 5: coronal st · coronal · 0.73mm/px · 3 of 83 slices shown]
[im 28/83  soft-tissue]
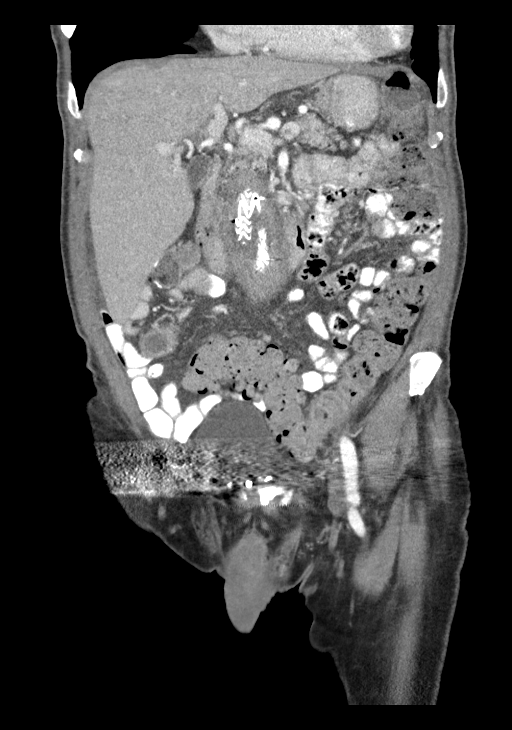
[im 37/83  soft-tissue]
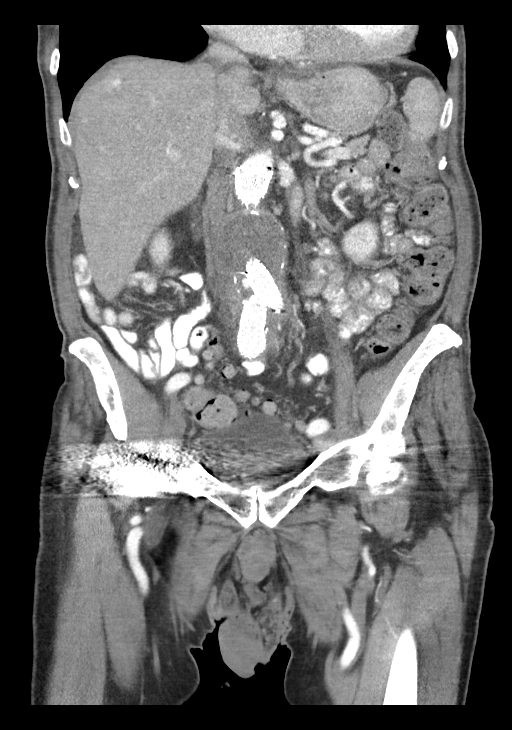
[im 46/83  soft-tissue]
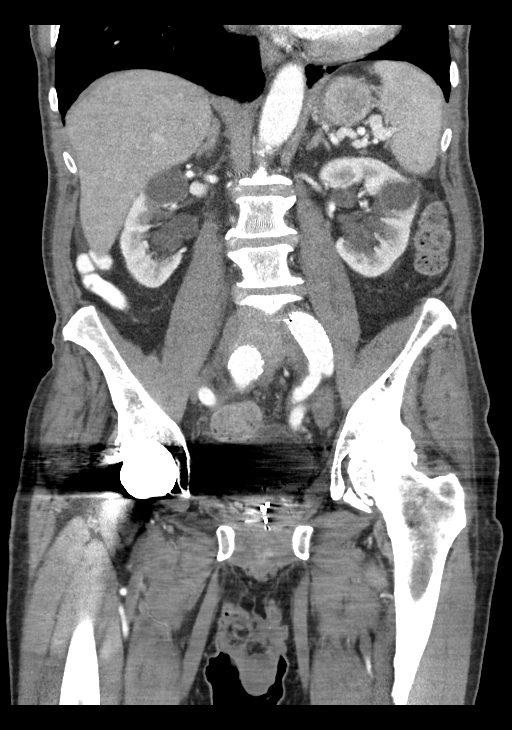

[15 of 46 positions shown; findings below may reference images not displayed]

FINDINGS: Lower chest: Lung bases are clear. Minimal stable linear
scarring/atelectasis left lower lobe. Mild stable cardiomegaly.

Hepatobiliary: Liver, gallbladder and biliary tree are normal.

Pancreas: Normal.

Spleen: Normal.

Adrenals/Urinary Tract: Adrenal glands are normal. Kidneys are
normal in size with multiple bilateral parapelvic and cortical cysts
unchanged. No hydronephrosis or nephrolithiasis. Visualized ureters
and bladder are unremarkable. Note that the distal ureters and
portions of the bladder are obscured due to streak artifact from
right hip prosthesis.

Stomach/Bowel: Stomach and small bowel are normal. Moderate
diverticulosis of the colon most prominent over the sigmoid colon.
Moderate fecal retention throughout the colon. Surgical suture line
over the colon in the right mid abdomen. Findings suggesting prior
right colectomy. Coloenteric anastomosis appears within normal.
Suture line over a small bowel loop in the right lower quadrant. No
dilated small bowel loops.

Vascular/Lymphatic: Calcified plaque over the abdominal aorta.
Evidence of patient's infrarenal abdominal aortoiliac stent graft
which is patent and unchanged. Stable native aneurysmal sac
measuring 4.8 cm in AP diameter. Evidence of patient's type 2
endoleak unchanged. Stable mild aneurysmal dilatation of the right
internal iliac artery measuring 2 cm.

Reproductive: Multiple radiation seed implants over the prostate.

Other: No significant free peritoneal fluid or free peritoneal air.

Musculoskeletal: Right hip arthroplasty intact. Moderate
osteoarthritic change of the left hip. Degenerative change of the
spine.
IMPRESSION: 1. No acute findings in the abdomen/pelvis.
2. Evidence of patient's infrarenal abdominal aortoiliac stent graft
which is patent and unchanged. Stable type 2 endoleak with stable
native aneurysmal sac measuring 4.8 cm in AP diameter. Stable 2 cm
aneurysmal dilatation of the right internal iliac artery.
3. Multiple bilateral parapelvic and cortical cysts unchanged.
4. Colonic diverticulosis without evidence of acute diverticulitis.
Moderate fecal retention throughout the colon.
5. Aortic atherosclerosis.

Aortic Atherosclerosis ([id number]).
